# Patient Record
Sex: Female | Born: 1945
Health system: Southern US, Community
[De-identification: ages and names within clinical notes are randomized; demographics above are authoritative.]

## PROBLEM LIST (undated history)

## (undated) DIAGNOSIS — Z8489 Family history of other specified conditions: Secondary | ICD-10-CM

## (undated) DIAGNOSIS — H409 Unspecified glaucoma: Secondary | ICD-10-CM

## (undated) DIAGNOSIS — C541 Malignant neoplasm of endometrium: Secondary | ICD-10-CM

## (undated) DIAGNOSIS — G35 Multiple sclerosis: Secondary | ICD-10-CM

## (undated) DIAGNOSIS — G43909 Migraine, unspecified, not intractable, without status migrainosus: Secondary | ICD-10-CM

## (undated) DIAGNOSIS — I4891 Unspecified atrial fibrillation: Secondary | ICD-10-CM

## (undated) DIAGNOSIS — E069 Thyroiditis, unspecified: Secondary | ICD-10-CM

## (undated) DIAGNOSIS — E038 Other specified hypothyroidism: Secondary | ICD-10-CM

## (undated) HISTORY — DX: Migraine, unspecified, not intractable, without status migrainosus: G43.909

## (undated) HISTORY — DX: Unspecified atrial fibrillation: I48.91

## (undated) HISTORY — DX: Other specified hypothyroidism: E03.8

## (undated) HISTORY — DX: Unspecified glaucoma: H40.9

## (undated) HISTORY — PX: TONSILLECTOMY: SUR1361

## (undated) HISTORY — PX: OTHER SURGICAL HISTORY: SHX169

## (undated) HISTORY — DX: Malignant neoplasm of endometrium: C54.1

## (undated) HISTORY — DX: Multiple sclerosis: G35

## (undated) HISTORY — PX: VAGINAL HYSTERECTOMY: SUR661

## (undated) HISTORY — DX: Other specified hypothyroidism: E06.9

---

## 1992-08-12 DIAGNOSIS — Z8679 Personal history of other diseases of the circulatory system: Secondary | ICD-10-CM

## 1992-08-12 HISTORY — DX: Personal history of other diseases of the circulatory system: Z86.79

## 1999-05-14 ENCOUNTER — Other Ambulatory Visit: Admission: RE | Admit: 1999-05-14 | Discharge: 1999-05-14 | Payer: Self-pay | Admitting: Internal Medicine

## 1999-06-27 ENCOUNTER — Encounter: Payer: Self-pay | Admitting: Internal Medicine

## 1999-06-27 ENCOUNTER — Encounter: Admission: RE | Admit: 1999-06-27 | Discharge: 1999-06-27 | Payer: Self-pay | Admitting: Internal Medicine

## 2000-02-14 ENCOUNTER — Encounter (INDEPENDENT_AMBULATORY_CARE_PROVIDER_SITE_OTHER): Payer: Self-pay

## 2000-02-14 ENCOUNTER — Other Ambulatory Visit: Admission: RE | Admit: 2000-02-14 | Discharge: 2000-02-14 | Payer: Self-pay | Admitting: Obstetrics and Gynecology

## 2000-06-25 ENCOUNTER — Encounter (INDEPENDENT_AMBULATORY_CARE_PROVIDER_SITE_OTHER): Payer: Self-pay

## 2000-06-25 ENCOUNTER — Other Ambulatory Visit: Admission: RE | Admit: 2000-06-25 | Discharge: 2000-06-25 | Payer: Self-pay | Admitting: Obstetrics and Gynecology

## 2001-04-22 ENCOUNTER — Encounter: Payer: Self-pay | Admitting: Internal Medicine

## 2001-04-22 ENCOUNTER — Encounter: Admission: RE | Admit: 2001-04-22 | Discharge: 2001-04-22 | Payer: Self-pay | Admitting: Internal Medicine

## 2001-06-18 ENCOUNTER — Other Ambulatory Visit: Admission: RE | Admit: 2001-06-18 | Discharge: 2001-06-18 | Payer: Self-pay | Admitting: Obstetrics and Gynecology

## 2002-09-03 ENCOUNTER — Encounter: Payer: Self-pay | Admitting: Internal Medicine

## 2002-09-03 ENCOUNTER — Encounter: Admission: RE | Admit: 2002-09-03 | Discharge: 2002-09-03 | Payer: Self-pay | Admitting: Internal Medicine

## 2003-09-15 ENCOUNTER — Other Ambulatory Visit: Admission: RE | Admit: 2003-09-15 | Discharge: 2003-09-15 | Payer: Self-pay | Admitting: Internal Medicine

## 2003-09-20 ENCOUNTER — Encounter: Admission: RE | Admit: 2003-09-20 | Discharge: 2003-09-20 | Payer: Self-pay | Admitting: Internal Medicine

## 2004-01-08 ENCOUNTER — Emergency Department (HOSPITAL_COMMUNITY): Admission: EM | Admit: 2004-01-08 | Discharge: 2004-01-08 | Payer: Self-pay | Admitting: Emergency Medicine

## 2004-11-06 ENCOUNTER — Encounter: Admission: RE | Admit: 2004-11-06 | Discharge: 2004-11-06 | Payer: Self-pay | Admitting: Internal Medicine

## 2005-01-18 ENCOUNTER — Other Ambulatory Visit: Admission: RE | Admit: 2005-01-18 | Discharge: 2005-01-18 | Payer: Self-pay | Admitting: Internal Medicine

## 2005-12-12 ENCOUNTER — Encounter: Admission: RE | Admit: 2005-12-12 | Discharge: 2005-12-12 | Payer: Self-pay | Admitting: Internal Medicine

## 2006-03-20 ENCOUNTER — Other Ambulatory Visit: Admission: RE | Admit: 2006-03-20 | Discharge: 2006-03-20 | Payer: Self-pay | Admitting: Internal Medicine

## 2006-08-12 DIAGNOSIS — G35 Multiple sclerosis: Secondary | ICD-10-CM

## 2006-08-12 HISTORY — DX: Multiple sclerosis: G35

## 2006-12-15 ENCOUNTER — Encounter: Admission: RE | Admit: 2006-12-15 | Discharge: 2006-12-15 | Payer: Self-pay | Admitting: Internal Medicine

## 2007-05-24 ENCOUNTER — Inpatient Hospital Stay (HOSPITAL_COMMUNITY): Admission: EM | Admit: 2007-05-24 | Discharge: 2007-05-28 | Payer: Self-pay | Admitting: Emergency Medicine

## 2007-05-25 ENCOUNTER — Encounter (INDEPENDENT_AMBULATORY_CARE_PROVIDER_SITE_OTHER): Payer: Self-pay | Admitting: Internal Medicine

## 2007-06-08 ENCOUNTER — Encounter: Admission: RE | Admit: 2007-06-08 | Discharge: 2007-07-13 | Payer: Self-pay | Admitting: Internal Medicine

## 2007-08-13 DIAGNOSIS — C541 Malignant neoplasm of endometrium: Secondary | ICD-10-CM

## 2007-08-13 HISTORY — DX: Malignant neoplasm of endometrium: C54.1

## 2007-12-16 ENCOUNTER — Ambulatory Visit (HOSPITAL_COMMUNITY): Admission: RE | Admit: 2007-12-16 | Discharge: 2007-12-16 | Payer: Self-pay | Admitting: Obstetrics and Gynecology

## 2007-12-16 ENCOUNTER — Encounter (INDEPENDENT_AMBULATORY_CARE_PROVIDER_SITE_OTHER): Payer: Self-pay | Admitting: Obstetrics and Gynecology

## 2008-01-05 ENCOUNTER — Encounter: Admission: RE | Admit: 2008-01-05 | Discharge: 2008-01-05 | Payer: Self-pay | Admitting: Internal Medicine

## 2009-01-23 ENCOUNTER — Encounter: Admission: RE | Admit: 2009-01-23 | Discharge: 2009-01-23 | Payer: Self-pay | Admitting: Internal Medicine

## 2009-12-05 ENCOUNTER — Ambulatory Visit (HOSPITAL_COMMUNITY): Admission: RE | Admit: 2009-12-05 | Discharge: 2009-12-05 | Payer: Self-pay | Admitting: Obstetrics and Gynecology

## 2010-03-09 ENCOUNTER — Encounter: Admission: RE | Admit: 2010-03-09 | Discharge: 2010-03-09 | Payer: Self-pay | Admitting: Internal Medicine

## 2010-03-14 ENCOUNTER — Encounter: Admission: RE | Admit: 2010-03-14 | Discharge: 2010-03-14 | Payer: Self-pay | Admitting: Internal Medicine

## 2010-08-12 DIAGNOSIS — K5790 Diverticulosis of intestine, part unspecified, without perforation or abscess without bleeding: Secondary | ICD-10-CM

## 2010-08-12 HISTORY — DX: Diverticulosis of intestine, part unspecified, without perforation or abscess without bleeding: K57.90

## 2010-09-02 ENCOUNTER — Encounter: Payer: Self-pay | Admitting: Internal Medicine

## 2010-10-30 LAB — CBC
HCT: 36.3 % (ref 36.0–46.0)
Hemoglobin: 12.8 g/dL (ref 12.0–15.0)
MCV: 100.9 fL — ABNORMAL HIGH (ref 78.0–100.0)
RBC: 3.6 MIL/uL — ABNORMAL LOW (ref 3.87–5.11)
WBC: 4.6 10*3/uL (ref 4.0–10.5)

## 2010-12-25 NOTE — Op Note (Signed)
NAMEROE, KOFFMAN NO.:  [id number]   MEDICAL RECORD NO.:  [id number]          PATIENT TYPE:  AMB   LOCATION:  SDC                           FACILITY:  [facility]   PHYSICIAN:  Gerald Leitz, MD          DATE OF BIRTH:  12/16/1945   DATE OF PROCEDURE:  12/16/2007  DATE OF DISCHARGE:                               OPERATIVE REPORT   PREOPERATIVE DIAGNOSES:  1. Postmenopausal bleeding.  2. Suspect endometrial mass.   POSTOPERATIVE DIAGNOSES:  1. Postmenopausal bleeding.  2. Suspect endometrial mass.   PROCEDURE:  Hysteroscopy, dilation and curettage.   SURGEON:  Gerald Leitz, MD   ASSISTANT:  None.   ANESTHESIA:  General.   FINDINGS:  Small endometrial mass.   SPECIMEN:  Endometrial curettings and possible polyp.   DISPOSITION OF SPECIMEN:  Pathology.   ESTIMATED BLOOD LOSS:  Minimal.   SORBITOL DEFICIT:  50 mL.   COMPLICATIONS:  None.   INDICATIONS:  This is a 65 year old with postmenopausal bleeding who had  a histogram performed that showed a question of hyperechoic mass  measuring 2.2 x 1.6 cm, desired therapy with hysteroscopy, dilation and  curettage, and possible removal of endometrial mass.  Informed consent  was obtained.   PROCEDURE:  The patient was taken to the operating room where she was  placed under general anesthesia.  She was placed in dorsal lithotomy  position, and prepped and draped in the usual sterile fashion.  Bivalve  speculum was placed into the vaginal wall.  The anterior lip of the  cervix was grasped with a single-tooth tenaculum.  The cervix was then  sounded to 5.5 cm.  Paracervical block was obtained with 5 mL of 0.25%  Marcaine injected at the 5 and 7 o'clock position.  The cervix was then  dilated to approximately 8 mm, and the hysteroscope was inserted.  The  patient was noted to have smooth circumscribed mass in the endometrium.  Hysteroscope was removed.  Sharp curettage was performed until a gritty  texture was noted  all way around.  During the curettage, the mass was  thought to have been removed.  Polyp forceps were then inserted and the  portion of the mass was removed at that time.  The hysteroscope was then  reinserted.  The mass was no longer visible at this time.  The patient's  endometrium appeared irregular.  There was no evidence of uterine  perforation.  The hysteroscope was removed.  The single-tooth tenaculum  was removed from the anterior lip of cervix.  Excellent hemostasis was  noted.  Bivalve speculum was removed.  The patient was taken to the  recovery room, awake and in stable condition.      Gerald Leitz, MD  Electronically Signed     TC/MEDQ  D:  12/16/2007  T:  12/17/2007  Job:  [id number]

## 2010-12-25 NOTE — H&P (Signed)
[name], [name]            ACCOUNT NO.:  [id number]   MEDICAL RECORD NO.:  [id number]         PATIENT TYPE:  LINP   LOCATION:                               FACILITY[facility]  [facility]   PHYSICIAN:  Michelene Gardener, MD    DATE OF BIRTH:  [birth year]   DATE OF ADMISSION:  05/24/2007  DATE OF DISCHARGE:                              HISTORY & PHYSICAL   PRIMARY CARE PHYSICIAN:  Cecille Aver, M.D.   CHIEF COMPLAINT:  Increasing numbness and weakness.   HISTORY OF PRESENT ILLNESS:  This is a 65 year old female with past  medical history of hypothyroidism and osteoporosis who presented with te  above-mentioned complaints.  Her symptoms started 1 week ago on Saturday  when she developed numbness and tingling over two fingers of her left  upper extremity.  Then with the tingling,  she developed numbness in the  same side.  She went on Tuesday to see her primary care physician, and  she met Dr. Donette Larry.  She as diagnosed with possible carpal tunnel  syndrome.  At that time, she still had good hand grip, and she had some  motion of her upper and lower extremities.  Then the numbness started  increasing in her left upper extremity and extended to the left side of  face and then involved her left lower extremity.  She also started  developing weakness over the left side and is dragging her left lower  extremity and is not able to function much with her left upper  extremity.  Currently she has numbness involving her left side, left  lower extremity, left upper extremity, and left side of the face and  weakness mainly in her left upper and lower extremities.  She came into  the ER.  She had a CT scan of the head and then MRI of her brain that  showed possible multiple sclerosis.  The Hospitalists service was called  for further evaluation.   PAST MEDICAL HISTORY:  Significant for:  1. Hypothyroidism.  2. Osteoarthritis.   PAST SURGICAL HISTORY:  Denied.   MEDICATIONS:  1. Synthroid 88  mcg p.o. once daily.  2. Actonel 70 mg p.o. once a week.  3. Vitamin D 50,000 units twice a month.  4. Calcium twice daily.   ALLERGIES:  PENICILLIN which caused rash.   SOCIAL HISTORY:  She denies smoking.  She drinks alcohol occasionally.   FAMILY HISTORY:  Her father had history of memory loss, and they never  found the reason.  Mother has history of skin cancer.  Her grandfather  had history of macular degeneration.  Her mother's sister has history of  brain tumor.   REVIEW OF SYSTEMS:  CONSTITUTIONAL:  Fatigability.  EYES:  No blurred  vision.  ENT:  No infection.  No hearing loss.  CARDIOVASCULAR:  No  chest pain, no shortness of breath.  RESPIRATORY:  No cough, no wheezes.  GI:  No nausea, no vomiting, no diarrhea.  GU:  No dysuria, no  hematuria.  ENDOCRINE:  No polyuria, no nocturia.  HEMATOLOGY:  No  bruising, no bleeding.  INFECTIOUS DISEASE:  No rash, no lesions.  NEUROLOGIC:  No numbness or tingling.  The rest of systems reviewed and  negative.   PHYSICAL EXAMINATION:  VITAL SIGNS:  Blood pressure 138/78, respiratory  rate 18, temperature 97.8, respiratory rate 16.  GENERAL APPEARANCE:  This is a middle-age Caucasian female not in acute  distress.  HEENT:  Conjunctivae pink.  Pupils equal, round, and reactive to light  and accommodation.  There is no ptosis. Hearing is intact.  No ear  discharge or infection.  There is no nose infection or bleeding.  Oral  mucosa is dry.  No pharyngeal erythema.  NECK:  Supple.  No JVD, no carotid bruits, no lymphadenopathy, no  thyroid enlargement.  No thyroid tenderness.  No murmurs, gallops, or thrills.  RESPIRATORY:  Breathing between 16 to 18.  There is no use of accessory  muscles.  No costal retractions.  No dullness.  No rales, rhonchi, or  wheeze.  ABDOMEN:  Soft, nontender, nondistended.  No hepatosplenomegaly.  Bowel  sounds are normal.  Umbilicus is central.  EXTREMITIES:  Lower extremities have no edema, no rash, no  varicose  veins.  SKIN:  No rash, no erythema.  NEUROLOGIC:  Cranial nerves II-XII seem to be intact.  Strength is 5/5  in right upper extremity, 5/5 in right lower extremity, 3/5 in left  lower extremity and 3/5 in left upper extremity.  Sensation is decreased  on the left side and normal on the right side.  The patient has good  coordination on the left side.   LABORATORY RESULTS:  Sodium 141, potassium 4.0, chloride 107, bicarb 29,  glucose 100, BUN 10, creatinine 0.71.  INR 1.0.  WBC 5.1, hemoglobin  13.4, hematocrit 38.5, MCV 92.5, platelet count 246.   MRI showed lesions questionable for multiple sclerosis.   IMPRESSION AND ASSESSMENT:  1. Multiple sclerosis.  I will admit this patient to the floor with      telemetry monitoring.  I will start her on a high dose of steroids.      Will start Solu-Medrol 1 gram IV once a day to be given as needed      for 5 days and then use tapering dose of prednisone.  This patient      will need neurologic evaluation, and will request neurologic      evaluation in the morning.  I will start this patient on aspirin      325 mg once a day.  I will get a physical therapy evaluation.  I      will also request an echocardiogram and carotid Dopplers just to      rule out the possibility of any underlying cause for a possible      stroke.  2. Hypothyroidism  Will continue Synthroid.  3. Osteoporosis. Will continue Actonel.   DISPOSITION:  This patient will be followed by Dr. Theressa Millard  starting May 25, 2007.   Total assessment time is 1 hour.      Michelene Gardener, MD  Electronically Signed     NAE/MEDQ  D:  05/24/2007  T:  05/24/2007  Job:  [id number]   cc:   Cecille Aver, M.D.  Fax: [phone]   Theressa Millard, M.D.  Fax: [phone]

## 2010-12-25 NOTE — H&P (Signed)
[name], [name] NO.:  [id number]   MEDICAL RECORD NO.:  [id number]          PATIENT TYPE:  AMB   LOCATION:  SDC                           FACILITY:  [facility]   PHYSICIAN:  Gerald Leitz, MD          DATE OF BIRTH:  12/25/1945   DATE OF ADMISSION:  12/15/2007  DATE OF DISCHARGE:                              HISTORY & PHYSICAL   DATE OF SCHEDULED SURGERY:  Dec 16, 2007.   HISTORY OF PRESENT ILLNESS:  This is a 65 year old G1, P1 with  postmenopausal bleeding secondary to suspected endometrial polyp or  mass.  She had an ultrasound performed on November 18, 2007, that showed the  endometrium to be thickened 1.79 cm with a questionable hyperechoic mass  measuring 2.2 x 1.6 cm.  The ovaries appeared normal bilaterally.  The  uterus appeared normal with a length of 6.0 cm.  She had spotting on a  daily basis since December 2008.   PAST MEDICAL HISTORY:  Multiple sclerosis, mitral valve prolapse,  hypothyroidism, and osteopenia.   PAST SURGICAL HISTORY:  Tonsillectomy in 1954.   OB HISTORY:  Spontaneous vaginal delivery x1.   GYN HISTORY:  No history of sexually transmitted diseases.  Previous  history of abnormal Pap smears; however, these resolved spontaneously.  Last Pap smear was normal.   MEDICATIONS:  Rebif, Synthroid, vitamin B, Caltrate, and Fosamax.   ALLERGIES:  Penicillin.   SOCIAL HISTORY:  The patient is married.  She denies tobacco use.  No  illicit drug use.   FAMILY HISTORY:  Negative for breast or ovarian cancer.   REVIEW OF SYSTEMS:  Negative except as stated in history of current  illness.   PHYSICAL EXAMINATION:  VITAL SIGNS:  Blood pressure 118/74, weight 116  pounds.  CARDIOVASCULAR:  Regular rate and rhythm.  LUNGS:  Clear to auscultation bilaterally.  ABDOMEN:  Soft, nontender, and nondistended.  No masses.  Positive bowel  sounds.  EXTREMITIES:  No clubbing, cyanosis, or edema.  GU:  Pelvic exam, atrophic external female genitalia.  No  vulvar or  vaginal lesions are seen.  Bimanual exam reveals normal size uterus.  No  adnexal masses, no tenderness.   ASSESSMENT AND PLAN:  A 65 year old with postmenopausal bleeding,  suspected endometrial polyp or mass, desires therapy with hysteroscopy,  dilation and curettage, and removal of endometrial mass.  Risks,  benefits, and  alternatives of surgery were discussed with the patient including but  not limited to infection, bleeding, possible perforation of the uterus,  damage to the surrounding organs, and need for further surgery.  The  patient was educated of all risks and desires to proceed with  hysteroscopy, dilation and curettage, and removal of endometrial mass.      Gerald Leitz, MD  Electronically Signed     TC/MEDQ  D:  12/15/2007  T:  12/16/2007  Job:  [id number]

## 2010-12-25 NOTE — Consult Note (Signed)
NAMECARMALITA, [name] NO.:  [id number]   MEDICAL RECORD NO.:  [id number]          PATIENT TYPE:  INP   LOCATION:  1418                         FACILITY:  [facility]   PHYSICIAN:  Melvyn Novas, M.D.  DATE OF BIRTH:  08/16/1945   DATE OF CONSULTATION:  05/25/2007  DATE OF DISCHARGE:                                 CONSULTATION   PRIMARY CARE PHYSICIAN:  Theressa Millard, M.D. at [facility].   HISTORY OF PRESENT ILLNESS:  Ms. Jiggetts is a 65 year old Caucasian  right-handed female who presented yesterday to the [facility] emergency  room after noticing new progression in the left-sided weakness, the  feeling of heaviness, and the inability to use her left hand.  She had  actually symptoms of the same kind since Saturday the preceding week,  but at that time saw Dr. Earl Gala in the outpatient setting, and was only  affected in the sensory of her left hand.  There was no associated  weakness, and it was felt that this could most likely be an ulna palsy  or carpal tunnel syndrome, however, over the last weekend, the symptoms  had progressed and now involves also the lower extremity and facial  numbness.  The patient was presenting to the ER with left hemiparesis,  and therefore stroke workup was first ordered.  Once an MRI was  obtained, it showed multiple old and one large new acute demyelinating  periventricular located lesion.  The new large lesion is in the right  hemisphere and fit the clinical symptoms.  The patient presents with the  differential now as multiple sclerosis versus other demyelinating or  vasculitis diseases.  Currently, the patient has still left-sided  weakness and numbness affecting her arm more than her leg.  But there is  no longer any facial weakness, and the patient states that she has been  noticing an increase in strength in her left lower extremity.   REVIEW OF SYSTEMS:  Tingling, weakness, left face, left arm, left  hand  at the time of presentation.  No diplopia, no dysphagia, no vision loss.  Possibly new symptoms are the inability to tie her shoes.  When she  stands on one leg, she has trouble with her balance needed to lean on  the wall.  This has probably occurred last month, and she also had just  in the last week an episode of urge incontinence which is normally not  the case for her.  Of course these review of systems are only obtained  in retrospect.  The patient never had the degree of impairment that she  now presented to the ER with.   SOCIAL HISTORY:  The patient drinks occasional social alcohol.  She does  not smoke, does not use any drugs.  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  Her hobby is gardening.  Her adult children have  established their own household.  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   FAMILY HISTORY:  Positive for rheumatoid arthritis in her mother,  possibly rheumatoid arthritis with osteophytes at the neck for her  father, and her mother also suffers from osteoporosis.  There is no  history of diabetes, hypertension, blood dyscrasias.  No lupus, and no  known history of MS.   PAST MEDICAL HISTORY.:  1. Osteopenia.  2. Corneal abrasion.  3. Anemia.  4. Dysmenorrhagia and perimenopause which has not recurred for the      last 7 years.  5. She has never had high cholesterol, high blood pressures or high      blood sugars.   PHYSICAL EXAMINATION:  VITAL SIGNS:  Regular.  The patient has a heart  rate of 62, respiratory rate of 18.  LUNGS:  Clear to auscultation.  Blood pressure was obtained through the  chart.  I did not measured it myself, and was 120/70 at the time she  presented first to the floor yesterday.  Intermittently, systolic blood  pressures have been as low at 86 and diastolic 50.  EXTREMITIES:  No peripheral clubbing, cyanosis or edema.  NEUROLOGICAL:  The patient sits in bed, is  alert, oriented, pleasant.  No acute distress.  No visible injuries.  The patient holds her left arm  with her right hand and moves it very passively.  Cranial nerve  examination:  The patient has preserved facial symmetry no extraocular  movements.  She states now that she has no papilledema.  She has good  peripheral vision, and no sensation of pressure behind the eyes  normal  neck range of motion is preserved.  Uvula and tongue are in midline.  Facial sensation tested to fine touch shows no difference between left  and right.  Motor examination shows the right side to be fully intact.  The left arm does not show antigravity movement, but can move in the  plane.  Grip strength is minimal.  The left leg can now be lifted for  about 5-7 seconds, antigravity drifts.  She has upgoing toes bilaterally  and bilaterally very brisk reflexes indicating spasticity and possible  preexisting upper motor neuron diseased lesions.  Sensory:  The patient  still has tingling and numbness in arm and hand, and to fine touch and  pinprick has a sensation of a gloved left arm or a lining that seems to  prevent her from feeling the stimulus in the normal fashion.  Finger-  nose test could not be performed on the left due to weakness.  Gait was  not tested, but the patient has been up and transferred to the arm chair  and bathroom with support.   ASSESSMENT:  Suspect demyelinating disease, most likely MS.  We will,  however, check for Lyme disease to rule this out.  We will also obtain a  vasculitis panel.   PLAN:  Start with Solu-Medrol as given now for 3 days 1 gram IV.  I  would give it q.20 h so that the peak dose does not interfere with the  patient's sleep pattern quite as much.  She remains on Protonix which  was already ordered by her [facility] physician.  The patient should  afterwards follow with an oral taper which I wrote in the order sheets.  She can have flu shot once she completes her steroid  doses.  Here in the  hospital, she will need rehab, physical therapy and occupational  therapy.  She is on a regular diet.  I would further recommend that the  patient receive an MRI of the cervical and thoracic spine.  Her recent  bladder incontinence symptoms and the generalized briskness of her  reflexes could relate to spinal cord lesion with MS.  If the diagnosis  of MS is verified by negative Lyme testing.  I would like her to start  on an injectable medication, an interferon, either Rebif or Betaseron  subcutaneously.  I would prefer the self-injector for either of these  drugs as it does not request the patient to either mix medication or to  have good dexterity to place the injectable.  At this time, the patient  can only use her right hand sufficiently, and her fine motor skills are  therefore impaired.  She can start with the injectable medications after  the IV Solu-Medrol course is completed.  I have also talked to Mrs.  Delaluz and her daughter, and her son-in-law who know Dr. Anne Hahn in  our office very well, and recommended a follow-up visit in 3-4 weeks.  If she has difficulties obtaining the injectable medication,  I would be  happy to see her earlier and arrange through our office.      Melvyn Novas, M.D.  Electronically Signed     CD/MEDQ  D:  05/25/2007  T:  05/26/2007  Job:  [id number]   cc:   Theressa Millard, M.D.  Fax: [phone]   [facility]

## 2010-12-25 NOTE — Discharge Summary (Signed)
NAMEMARCENA, [name] NO.:  [id number]   MEDICAL RECORD NO.:  [id number]          PATIENT TYPE:  INP   LOCATION:  1418                         [facility]ACILITY:  [facility]   PHYSICIAN:  Candyce Churn, M.D.DATE OF BIRTH:  26-Mar-1946   DATE OF ADMISSION:  05/24/2007  DATE OF DISCHARGE:  05/28/2007                               DISCHARGE SUMMARY   DISCHARGE DIAGNOSES:  1. Acute onset left-sided weakness and paresthesias.  2. Probable new onset multiple sclerosis secondary to MRI and physical      findings.  3. Hypothyroidism.  4. History of osteoarthritis.  5. History of osteoporosis.   ALLERGIES:  PENICILLIN - RASH.   DISCHARGE MEDICATIONS:  1. Synthroid 88 mcg daily.  2. Vitamin D 50,000 international units twice monthly orally.  3. Actonel 70 mg once weekly.  4. Calcium 500 mg p.o. b.i.d.  5. Rebif 80.8 mcg subcu Monday, Wednesday, Friday for two weeks then      22 mcg subcu Monday, Wednesday, Friday for 2 weeks then as per      neurologist.  6. Prednisone 10 mg daily for 3 days and 20 mg orally for 3 days then      10 mg orally for 3 days then discontinue.   CONSULTATIONS:  Dr. Melvyn Novas, Neurology.   PROCEDURES:  1. Head CT May 24, 2007, revealing chronic ischemic changes,      hypodensity in the right frontal lobe, paraventricular white matter      with suggestion of mass effect upon the right lateral ventricle      frontal horn.  Acute ischemic event was suggested, and MRI was      performed subsequently.  2. MRI of the brain without contrast - fairly significant white matter      disease in the periventricular and subcortical region of the brain      - supratentorial in location with one lesion showing restricted      effusions.  Findings were consistent with acute and chronic      multiple sclerosis.  3. MRI angiography of the head - carotid and basilar arteries patent.      No stenosis or occlusion.  No intracranial aneurysms.  No  evidence      for intracranial atherosclerotic change.  4. Carotid Dopplers - no significant internal carotid artery stenosis,      vertebral artery flow was antegrade.   LABORATORY:  On discharge, B-met from May 28, 2007, revealed sodium  142, potassium was moderately low at 3.1, chloride 107 bicarb 29,  glucose 91, BUN 13, creatinine 0.73.  Cholesterol profile on May 25, 2007, revealed a cholesterol 196, triglycerides 22, cholesterol 80, LDL  cholesterol 112, total cholesterol ratio of 2.5.  TSH on May 24, 2007, was 1.977.  CBC and 212 lady revealed a white count 5100,  hemoglobin 13.4, platelet count 246,000.  Protime was 13.5 seconds.  PTT  he was 32 seconds on May 24, 2007.   HOSPITAL COURSE:  [name] is a very pleasant 65 year old female  with a past medical history of hypothyroidism and osteoporosis.  Over  several  weeks prior to admission, she was noticing some very minimal  problems with balance.  She usually ties her shoes by standing on one  leg and lifting one foot and tying it in the air, and then doing the  same with the other foot.  She realized she could not do that over the  past month.  She would have to lean against the wall to do this.   A week prior to admission, she developed some numbness and tingling of  her two fingers of the left upper extremity and also some paresthesias  on her left side.  The numbness started increasing in the left upper  extremity extending to the left side of her face and involved the left  lower extremity, and she noticed a weakness on the left side.  Principally, left upper extremity greater than the left lower extremity,  but she was dragging her left lower extremity.  She presented to the  emergency room, and MRI revealed probable multiple sclerosis in terms of  her lesions.   She has been hospitalized.  She was treated with high-dose IV Solu-  Medrol 1 gram daily for 4 days and Rebif was started yesterday,  and she  will continue prednisone taper as per above.   She regained strength in her extremities and is adequately prepared to  go to be discharged with outpatient rehab and outpatient nursing visits  to help with her Rebif injections.  She will be followed up with Dr.  Vickey Huger in 2 weeks.   CONDITION ON DISCHARGE:  Improved.      Candyce Churn, M.D.  Electronically Signed     RNG/MEDQ  D:  05/28/2007  T:  05/29/2007  Job:  [id number]   cc:   Theressa Millard, M.D.  Fax: [phone]   Melvyn Novas, M.D.  Fax: [phone]

## 2011-01-31 ENCOUNTER — Other Ambulatory Visit: Payer: Self-pay | Admitting: Internal Medicine

## 2011-01-31 DIAGNOSIS — Z1231 Encounter for screening mammogram for malignant neoplasm of breast: Secondary | ICD-10-CM

## 2011-03-18 ENCOUNTER — Ambulatory Visit
Admission: RE | Admit: 2011-03-18 | Discharge: 2011-03-18 | Disposition: A | Discharge status: Home / Self Care | Payer: 59 | Source: Ambulatory Visit | Attending: Internal Medicine | Admitting: Internal Medicine

## 2011-03-18 DIAGNOSIS — Z1231 Encounter for screening mammogram for malignant neoplasm of breast: Secondary | ICD-10-CM

## 2011-05-22 LAB — BASIC METABOLIC PANEL
BUN: 13
CO2: 26
Calcium: 8.5
Calcium: 8.7
Chloride: 107
Creatinine, Ser: 0.73
Creatinine, Ser: 0.77
GFR calc Af Amer: >60
GFR calc non Af Amer: >60
Glucose, Bld: 144 — ABNORMAL HIGH
Glucose, Bld: 91
Sodium: 139
Sodium: 142

## 2011-05-23 LAB — CBC
HCT: 38.5
Hemoglobin: 13.4
MCHC: 34.8
Platelets: 246
RDW: 13.8

## 2011-05-23 LAB — LIPID PANEL
HDL: 80
Total CHOL/HDL Ratio: 2.5
Triglycerides: 22

## 2011-05-23 LAB — B. BURGDORFI ANTIBODIES BY WB
Lyme Disease 23 kD IgG: NONREACTIVE
Lyme Disease 28 kD IgG: NONREACTIVE
Lyme Disease 39 kD IgG: NONREACTIVE
Lyme Disease 41 kD IgG: NONREACTIVE
Lyme Disease 45 kD IgG: NONREACTIVE
Lyme Disease 66 kD IgG: REACTIVE
Lyme Disease 93 kD IgG: NONREACTIVE

## 2011-05-23 LAB — BASIC METABOLIC PANEL
CO2: 25
CO2: 29
Chloride: 107
Chloride: 111
Creatinine, Ser: 0.71
GFR calc Af Amer: >60
GFR calc non Af Amer: >60
Potassium: 4
Sodium: 142

## 2011-05-23 LAB — DIFFERENTIAL
Eosinophils Absolute: 0.2
Lymphs Abs: 1.7
Monocytes Relative: 10
Neutrophils Relative %: 53

## 2011-05-23 LAB — TSH: TSH: 1.977

## 2012-03-17 ENCOUNTER — Other Ambulatory Visit: Payer: Self-pay | Admitting: Internal Medicine

## 2012-03-17 DIAGNOSIS — Z1231 Encounter for screening mammogram for malignant neoplasm of breast: Secondary | ICD-10-CM

## 2012-04-01 ENCOUNTER — Ambulatory Visit: Payer: 59

## 2012-04-17 ENCOUNTER — Ambulatory Visit
Admission: RE | Admit: 2012-04-17 | Discharge: 2012-04-17 | Disposition: A | Discharge status: Home / Self Care | Payer: 59 | Source: Ambulatory Visit | Attending: Internal Medicine | Admitting: Internal Medicine

## 2012-04-17 DIAGNOSIS — Z1231 Encounter for screening mammogram for malignant neoplasm of breast: Secondary | ICD-10-CM

## 2012-04-24 ENCOUNTER — Ambulatory Visit: Payer: 59 | Attending: Sports Medicine | Admitting: Physical Therapy

## 2012-04-24 DIAGNOSIS — IMO0001 Reserved for inherently not codable concepts without codable children: Secondary | ICD-10-CM | POA: Insufficient documentation

## 2012-04-24 DIAGNOSIS — M25619 Stiffness of unspecified shoulder, not elsewhere classified: Secondary | ICD-10-CM | POA: Insufficient documentation

## 2012-04-24 DIAGNOSIS — M25519 Pain in unspecified shoulder: Secondary | ICD-10-CM | POA: Insufficient documentation

## 2012-04-27 ENCOUNTER — Ambulatory Visit: Payer: 59 | Admitting: Physical Therapy

## 2012-04-29 ENCOUNTER — Ambulatory Visit: Payer: 59 | Admitting: Rehabilitative and Restorative Service Providers"

## 2012-05-05 ENCOUNTER — Ambulatory Visit: Payer: 59 | Admitting: Physical Therapy

## 2012-05-06 ENCOUNTER — Ambulatory Visit: Payer: 59 | Admitting: Rehabilitative and Restorative Service Providers"

## 2012-05-08 ENCOUNTER — Encounter: Payer: 59 | Admitting: Physical Therapy

## 2012-05-11 ENCOUNTER — Ambulatory Visit: Payer: 59 | Admitting: Rehabilitative and Restorative Service Providers"

## 2012-05-13 ENCOUNTER — Encounter: Payer: 59 | Admitting: Physical Therapy

## 2012-05-13 ENCOUNTER — Ambulatory Visit: Payer: 59 | Attending: Sports Medicine | Admitting: Rehabilitative and Restorative Service Providers"

## 2012-05-13 DIAGNOSIS — IMO0001 Reserved for inherently not codable concepts without codable children: Secondary | ICD-10-CM | POA: Insufficient documentation

## 2012-05-13 DIAGNOSIS — M25519 Pain in unspecified shoulder: Secondary | ICD-10-CM | POA: Insufficient documentation

## 2012-05-13 DIAGNOSIS — M25619 Stiffness of unspecified shoulder, not elsewhere classified: Secondary | ICD-10-CM | POA: Insufficient documentation

## 2012-05-18 ENCOUNTER — Ambulatory Visit: Payer: 59 | Admitting: Rehabilitative and Restorative Service Providers"

## 2012-05-20 ENCOUNTER — Ambulatory Visit: Payer: 59 | Admitting: Rehabilitative and Restorative Service Providers"

## 2012-05-25 ENCOUNTER — Encounter: Payer: 59 | Admitting: Rehabilitative and Restorative Service Providers"

## 2012-05-27 ENCOUNTER — Encounter: Payer: 59 | Admitting: Rehabilitative and Restorative Service Providers"

## 2012-05-28 ENCOUNTER — Encounter: Payer: 59 | Admitting: Rehabilitative and Restorative Service Providers"

## 2012-05-29 ENCOUNTER — Encounter: Payer: 59 | Admitting: Physical Therapy

## 2012-11-18 ENCOUNTER — Other Ambulatory Visit: Payer: Self-pay

## 2012-12-15 ENCOUNTER — Ambulatory Visit (INDEPENDENT_AMBULATORY_CARE_PROVIDER_SITE_OTHER): Payer: 59 | Admitting: Nurse Practitioner

## 2012-12-15 ENCOUNTER — Encounter: Payer: Self-pay | Admitting: Nurse Practitioner

## 2012-12-15 VITALS — BP 110/60 | HR 66 | Ht 66.0 in | Wt 112.0 lb

## 2012-12-15 DIAGNOSIS — I4819 Other persistent atrial fibrillation: Secondary | ICD-10-CM | POA: Insufficient documentation

## 2012-12-15 DIAGNOSIS — R519 Headache, unspecified: Secondary | ICD-10-CM | POA: Insufficient documentation

## 2012-12-15 DIAGNOSIS — Z79899 Other long term (current) drug therapy: Secondary | ICD-10-CM

## 2012-12-15 DIAGNOSIS — Z7901 Long term (current) use of anticoagulants: Secondary | ICD-10-CM | POA: Insufficient documentation

## 2012-12-15 DIAGNOSIS — G35D Multiple sclerosis, unspecified: Secondary | ICD-10-CM

## 2012-12-15 DIAGNOSIS — G35 Multiple sclerosis: Secondary | ICD-10-CM

## 2012-12-15 DIAGNOSIS — R51 Headache: Secondary | ICD-10-CM

## 2012-12-15 DIAGNOSIS — R209 Unspecified disturbances of skin sensation: Secondary | ICD-10-CM

## 2012-12-15 NOTE — Patient Instructions (Addendum)
Continue Rebif 3 times a week Given sample of Cambia to try for headaches acutely Followup in 6 months and when necessary

## 2012-12-15 NOTE — Progress Notes (Signed)
HPI: Patient returns for followup after last visit 05/27/2012. She has a history of multiple sclerosis which was diagnosed in 2008. She is currently on Rebif tolerating the medication without significant injection reactions. She does not pretreat. She denies  spasms, focal weakness, sensory changes, visual changes, speech or swallowing problems, no problems with bowel or bladder function. She has not fallen, she does not use an assistive device. She does report several bad headaches in the last 6 months that lasted for several days not relieved with over-the-counter preparations. She has a history of migraines.   ROS:  - headache  Physical Exam General: well developed, well nourished, seated, in no evident distress Head: head normocephalic and atraumatic. Oropharynx benign Neck: supple with no carotid or supraclavicular bruits Cardiovascular: regular rate and rhythm, no murmurs  Neurologic Exam Mental Status: Awake and fully alert. Oriented to place and time. Follows all commands.  Mood and affect appropriate.  Cranial Nerves: Pupils equal, briskly reactive to light. Extraocular movements full without nystagmus. Visual fields full to confrontation. Hearing intact and symmetric to finger snap. Facial sensation intact. Face, tongue, palate move normally and symmetrically. Neck flexion and extension normal.  Motor: Normal bulk and tone. Normal strength in all tested extremity muscles. Sensory.: intact to touch and pinprick and vibratory.  Coordination: Rapid alternating movements normal in all extremities. Finger-to-nose and heel-to-shin performed accurately bilaterally. Gait and Station: Arises from chair without difficulty. Stance is normal. Gait demonstrates normal stride length and balance . Able to heel, toe and tandem walk without difficulty.  Reflexes: 2+ and symmetric. Toes downgoing.     ASSESSMENT: Multiple sclerosis currently on Rebif without side effects. Headaches with nausea and  visual disturbance, history of migraine     PLAN: Continue Rebif 3 times a week, labs done at primary care Given sample of [place] to try for headaches acutely, call for Rx Followup in 6 months and when necessary   [name], GNP-BC APRN

## 2013-04-15 ENCOUNTER — Other Ambulatory Visit: Payer: Self-pay

## 2013-04-15 DIAGNOSIS — Z1231 Encounter for screening mammogram for malignant neoplasm of breast: Secondary | ICD-10-CM

## 2013-04-30 ENCOUNTER — Ambulatory Visit
Admission: RE | Admit: 2013-04-30 | Discharge: 2013-04-30 | Disposition: A | Discharge status: Home / Self Care | Payer: 59 | Source: Ambulatory Visit

## 2013-04-30 DIAGNOSIS — Z1231 Encounter for screening mammogram for malignant neoplasm of breast: Secondary | ICD-10-CM

## 2013-05-06 ENCOUNTER — Other Ambulatory Visit: Payer: Self-pay | Admitting: Neurology

## 2013-05-11 ENCOUNTER — Other Ambulatory Visit: Payer: Self-pay

## 2013-05-11 MED ORDER — INTERFERON BETA-1A 44 MCG/0.5ML ~~LOC~~ SOLN: 44.0000 ug | SUBCUTANEOUS | Status: DC

## 2013-05-11 NOTE — Telephone Encounter (Signed)
Pharmacy called and asked [place] to resend Rx.

## 2013-06-17 ENCOUNTER — Ambulatory Visit (INDEPENDENT_AMBULATORY_CARE_PROVIDER_SITE_OTHER): Payer: Medicare Other | Admitting: Nurse Practitioner

## 2013-06-17 ENCOUNTER — Encounter: Payer: Self-pay | Admitting: Nurse Practitioner

## 2013-06-17 ENCOUNTER — Encounter (INDEPENDENT_AMBULATORY_CARE_PROVIDER_SITE_OTHER): Payer: Self-pay

## 2013-06-17 VITALS — BP 92/55 | HR 71 | Ht 67.0 in | Wt 112.0 lb

## 2013-06-17 DIAGNOSIS — R209 Unspecified disturbances of skin sensation: Secondary | ICD-10-CM

## 2013-06-17 DIAGNOSIS — R51 Headache: Secondary | ICD-10-CM

## 2013-06-17 DIAGNOSIS — Z79899 Other long term (current) drug therapy: Secondary | ICD-10-CM

## 2013-06-17 DIAGNOSIS — G35 Multiple sclerosis: Secondary | ICD-10-CM

## 2013-06-17 MED ORDER — INTERFERON BETA-1A 44 MCG/0.5ML ~~LOC~~ SOLN: 44.0000 ug | SUBCUTANEOUS | Status: DC

## 2013-06-17 NOTE — Progress Notes (Addendum)
[facility]  PATIENT: [name] DOB: Apr 25, 1946   REASON FOR VISIT: Followup for multiple sclerosis    HISTORY OF PRESENT ILLNESS:[name], 67 year old white female returns for followup. She is a patient of Dr. Vickey Huger who is out of the office today She has a history of multiple sclerosis which was diagnosed in 2008. She is currently on Rebif tolerating the medication without significant injection reactions. She does not pretreat. She denies spasms, focal weakness, sensory changes, visual changes, speech or swallowing problems, no problems with bowel or bladder function. She has not fallen, she does not use an assistive device. Headaches in good control.  She has a history of migraines. Last MRI of the brain 2009.    REVIEW OF SYSTEMS: Full 14 system review of systems performed and notable only for:  Constitutional: N/A  Cardiovascular: N/A  Ear/Nose/Throat: N/A  Skin: N/A  Eyes: N/A  Respiratory: N/A  Gastroitestinal: N/A  Hematology/Lymphatic: N/A  Endocrine: N/A Musculoskeletal:N/A  Allergy/Immunology: N/A  Neurological: N/A Psychiatric: N/A   ALLERGIES: Allergies  Allergen Reactions  . Codeine   . Penicillins     HOME MEDICATIONS: Outpatient Prescriptions Prior to Visit  Medication Sig Dispense Refill  . Fexofenadine HCl (ALLEGRA PO) Take by mouth.      . interferon beta-1a (REBIF) 44 MCG/0.5ML injection Inject 0.5 mLs (44 mcg total) into the skin 3 (three) times a week.  6 mL  2  . Iron TABS Take by mouth.      . levothyroxine (SYNTHROID, LEVOTHROID) 88 MCG tablet       . Vitamin D, Ergocalciferol, (DRISDOL) 50000 UNITS CAPS        No facility-administered medications prior to visit.    PAST MEDICAL HISTORY: Past Medical History  Diagnosis Date  . Migraine   . Endometrial cancer   . Multiple sclerosis     PAST SURGICAL HISTORY: Past Surgical History  Procedure Laterality Date  . Vaginal hysterectomy      FAMILY  HISTORY: History reviewed. No pertinent family history.  SOCIAL HISTORY: History   Social History  . Marital Status: Married    Spouse Name: August Saucer    Number of Children: 1  . Years of Education: 14   Occupational History  .  [employer]   Social History Main Topics  . Smoking status: Never Smoker   . Smokeless tobacco: Never Used  . Alcohol Use: Yes     Comment: wine on occ  . Drug Use: No  . Sexual Activity: Not on file   Other Topics Concern  . Not on file   Social History Narrative   Patient is married Development worker, international aid) and lives with his husband.   Patient has one child.   Patient is retired.   Patient has a college education.   Patient is right-handed.   Patient drinks very little caffeine.     PHYSICAL EXAM  Filed Vitals:   06/17/13 0954  BP: 92/55  Pulse: 71  Height: 5\' 7"  (1.702 m)  Weight: 112 lb (50.803 kg)   Body mass index is 17.54 kg/(m^2).  Generalized: Well developed, in no acute distress  Head: normocephalic and atraumatic,. Oropharynx benign  Neck: Supple, no carotid bruits  Cardiac: Regular rate rhythm, no murmur  Musculoskeletal: No deformity   Neurological examination   Mentation: Alert oriented to time, place, history taking. Follows all commands speech and language fluent  Cranial nerve II-XII: Pupils were equal round reactive to light extraocular movements were full, visual field were  full on confrontational test. Facial sensation and strength were normal. hearing was intact to finger rubbing bilaterally. Uvula tongue midline. head turning and shoulder shrug and were normal and symmetric.Tongue protrusion into cheek strength was normal. Motor: normal bulk and tone, full strength in the BUE, BLE, fine finger movements normal, no pronator drift. No focal weakness Sensory: normal and symmetric to light touch, pinprick, and  vibration  Coordination: finger-nose-finger, heel-to-shin bilaterally, no dysmetria Reflexes: Brachioradialis 2/2, biceps 2/2,  triceps 2/2, patellar 2/2, Achilles 2/2, plantar responses were flexor bilaterally. Gait and Station: Rising up from seated position without assistance, normal stance, oderate stride, good arm swing, smooth turning, able to perform tiptoe, and heel walking without difficulty.   DIAGNOSTIC DATA (LABS, IMAGING, TESTING) -None to review    ASSESSMENT AND PLAN  67 y.o. year old female  has a past medical history of Migraine; Endometrial cancer; and Multiple sclerosis. here to followup. She is currently on Rebif without exacerbation. She has questions about atrophy of the brain with Mandy. Aware that her last MRI was in 2009 needs to be repeated. She also has concerns about being able to pay for her Rebif next year when she will lose her retirement medical insurance.  Will check labs today, CBC, CMP  Will get MRI of the brain and compare to 2009 Continue Rebif 3 times a week will refill Followup in 6 months. Vst time 25 min Nilda Riggs, [facility], [facility], APRN  [facility] [address], [address] [place], [place] [address] [phone]  I reviewed the above note and documentation by the Nurse Practitioner and agree with the history, physical exam, assessment and plan as outlined above.

## 2013-06-17 NOTE — Patient Instructions (Signed)
Will check labs today Will get MRI of the brain and compare to 2009 Continue Rebif 3 times a week Followup in 6 months

## 2013-06-23 ENCOUNTER — Telehealth: Payer: Self-pay | Admitting: Nurse Practitioner

## 2013-06-23 NOTE — Telephone Encounter (Signed)
I do not see that patient had labs ordered to monitor the side effects with Rebif. Please call and remind her to  get labs done.

## 2013-06-24 NOTE — Telephone Encounter (Signed)
noted 

## 2013-06-24 NOTE — Telephone Encounter (Signed)
Called patient to inform of labs and she said that she going to Pcp next week and will have them done there, will him fax labs to our office

## 2013-06-30 ENCOUNTER — Other Ambulatory Visit: Payer: Self-pay | Admitting: Neurology

## 2013-06-30 ENCOUNTER — Telehealth: Payer: Self-pay | Admitting: Neurology

## 2013-07-01 NOTE — Telephone Encounter (Signed)
Rx was already resent for a 3 month supply.

## 2013-07-02 ENCOUNTER — Ambulatory Visit
Admission: RE | Admit: 2013-07-02 | Discharge: 2013-07-02 | Disposition: A | Discharge status: Home / Self Care | Payer: Medicare Other | Source: Ambulatory Visit | Attending: Nurse Practitioner | Admitting: Nurse Practitioner

## 2013-07-02 DIAGNOSIS — G35 Multiple sclerosis: Secondary | ICD-10-CM

## 2013-07-02 MED ORDER — GADOBENATE DIMEGLUMINE 529 MG/ML IV SOLN
10.0000 mL | Freq: Once | INTRAVENOUS | Status: AC | PRN
  Administered 2013-07-02: 10 mL via INTRAVENOUS

## 2013-08-18 ENCOUNTER — Other Ambulatory Visit: Payer: Self-pay

## 2013-08-18 MED ORDER — INTERFERON BETA-1A 44 MCG/0.5ML ~~LOC~~ SOLN: 44.0000 ug | SUBCUTANEOUS | Status: DC

## 2013-12-15 ENCOUNTER — Ambulatory Visit (INDEPENDENT_AMBULATORY_CARE_PROVIDER_SITE_OTHER): Payer: Medicare Other | Admitting: Nurse Practitioner

## 2013-12-15 ENCOUNTER — Encounter: Payer: Self-pay | Admitting: Nurse Practitioner

## 2013-12-15 ENCOUNTER — Encounter: Payer: Self-pay | Admitting: Neurology

## 2013-12-15 VITALS — BP 101/60 | HR 64 | Ht 66.0 in | Wt 112.0 lb

## 2013-12-15 DIAGNOSIS — R209 Unspecified disturbances of skin sensation: Secondary | ICD-10-CM

## 2013-12-15 DIAGNOSIS — G35D Multiple sclerosis, unspecified: Secondary | ICD-10-CM

## 2013-12-15 DIAGNOSIS — G35 Multiple sclerosis: Secondary | ICD-10-CM

## 2013-12-15 DIAGNOSIS — Z79899 Other long term (current) drug therapy: Secondary | ICD-10-CM

## 2013-12-15 NOTE — Patient Instructions (Signed)
Continue Rebif 3 times weekly Review of labs from Dr. Nancee Liter office Discuss most recent MRI of the brain 06/23/13 Followup in 6 months

## 2013-12-15 NOTE — Progress Notes (Signed)
[facility]  PATIENT: [name] DOB: [birth year]   REASON FOR VISIT: Followup for multiple sclerosis    HISTORY OF PRESENT ILLNESS:[name], 68 year old female returns for followup. She has a history of multiple sclerosis which was diagnosed in 2008. She is currently on Rebif tolerating the medication without significant injection reactions. She does not pretreat. She denies spasms, focal weakness, sensory changes, visual changes, speech or swallowing problems, no problems with bowel or bladder function. She has not fallen, she does not use an assistive device. Headaches in good control. She has a history of migraines. Last MRI of the brain 07/02/13 read as abnormal MRI scan of brain showing multilple periventricular and supratentorial white matter lesions consistent with multiple sclerosis. No enhancing lesions are noted. The presence of T 1 black holes indicates chronic disease.Overall mild increase in white matter lesion load compared to MRI 05/24/2007      REVIEW OF SYSTEMS: Full 14 system review of systems performed and notable only for those listed, all others are neg:  Constitutional: N/A  Cardiovascular: N/A  Ear/Nose/Throat: N/A  Skin: N/A  Eyes: N/A  Respiratory: N/A  Gastroitestinal: N/A  Hematology/Lymphatic: N/A  Endocrine: N/A Musculoskeletal:N/A  Allergy/Immunology: N/A  Neurological: occasional dizziness Psychiatric: N/A Sleep : NA   ALLERGIES: Allergies  Allergen Reactions  . Codeine   . Penicillins     HOME MEDICATIONS: Outpatient Prescriptions Prior to Visit  Medication Sig Dispense Refill  . Fexofenadine HCl (ALLEGRA PO) Take by mouth.      . interferon beta-1a (REBIF) 44 MCG/0.5ML injection Inject 0.5 mLs (44 mcg total) into the skin 3 (three) times a week.  18 mL  1  . Iron TABS Take by mouth.      . levothyroxine (SYNTHROID, LEVOTHROID) 88 MCG tablet       . Vitamin D, Ergocalciferol, (DRISDOL) 50000 UNITS CAPS         No facility-administered medications prior to visit.    PAST MEDICAL HISTORY: Past Medical History  Diagnosis Date  . Migraine   . Endometrial cancer   . Multiple sclerosis     PAST SURGICAL HISTORY: Past Surgical History  Procedure Laterality Date  . Vaginal hysterectomy      FAMILY HISTORY: History reviewed. No pertinent family history.  SOCIAL HISTORY: History   Social History  . Marital Status: Married    Spouse Name: [name]    Number of Children: 1  . Years of Education: 14   Occupational History  .  [employer]   Social History Main Topics  . Smoking status: Never Smoker   . Smokeless tobacco: Never Used  . Alcohol Use: Yes     Comment: wine on occ  . Drug Use: No  . Sexual Activity: Not on file   Other Topics Concern  . 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   Patient has one child.   Patient is retired.   Patient has a college education.   Patient is right-handed.   Patient drinks very little caffeine.     PHYSICAL EXAM  Filed Vitals:   12/15/13 1451  BP: 101/60  Pulse: 64  Height: 5\' 6"  (1.676 m)  Weight: 112 lb (50.803 kg)   Body mass index is 18.09 kg/(m^2).  Generalized: Well developed, in no acute distress  Head: normocephalic and atraumatic,. Oropharynx benign  Neck: Supple, no carotid bruits  Cardiac: Regular rate rhythm, no murmur  Musculoskeletal: No deformity   Neurological examination   Mentation: Alert oriented to time, place, history taking. Follows all commands speech and language fluent  Cranial nerve II-XII: Fundoscopic exam reveals sharp disc margins.Pupils were equal round reactive to light extraocular movements were full, visual field were full on confrontational test. Facial sensation and strength were normal. hearing was intact to finger rubbing bilaterally. Uvula tongue midline. head turning and shoulder shrug were normal and symmetric.Tongue protrusion  into cheek strength was normal. Motor: normal bulk and tone, full strength in the BUE, BLE, fine finger movements normal, no pronator drift. No focal weakness Sensory: normal and symmetric to light touch, pinprick, and  vibration  Coordination: finger-nose-finger, heel-to-shin bilaterally, no dysmetria Reflexes: Brachioradialis 2/2, biceps 2/2, triceps 2/2, patellar 2/2, Achilles 2/2, plantar responses were flexor bilaterally. Gait and Station: Rising up from seated position without assistance, normal stance,  moderate stride, good arm swing, smooth turning, able to perform tiptoe, and heel walking without difficulty. Tandem gait is steady  DIAGNOSTIC DATA (LABS, IMAGING, TESTING) -   ASSESSMENT AND PLAN  68 y.o. year old female  has a past medical history of Migraine;  and Multiple sclerosis. here to followup. She remains on Rebif 3 times a week without injection site issues.   Continue Rebif 3 times weekly Reviewed   labs from Dr. Nancee Liter office done within the last 6 months Discussed most recent MRI of the brain from 06/23/13 which was abnormal MRI scan of brain showing multilple periventricular and supratentorial white matter lesions consistent with multiple sclerosis. No enhancing lesions are noted. The presence of T 1 black holes indicates chronic disease.Overall mild increase in white matter lesion load compared to MRI 05/24/2007  Followup in 6 months [name], [facility], [facility], [facility]  [facility] [address], [place], [place] [address] [phone]

## 2013-12-16 NOTE — Progress Notes (Signed)
I agree with the assessment and plan as directed by NP .The patient is known to me .   Laporsche Hoeger, MD  

## 2013-12-22 ENCOUNTER — Telehealth: Payer: Self-pay | Admitting: Nurse Practitioner

## 2013-12-22 NOTE — Telephone Encounter (Signed)
Patient calling stating she had an appointment last week and forgot to tell Hoyle Sauer about her headaches. She gets terrible headaches at least once a month, sometimes more. She had one on Sunday that required her taking 10 Excedrin tablets. She would like to try Fioricet (her daughter takes this and it works well for her), or any other medication that Hoyle Sauer thinks would be appropriate. Please call to advise.

## 2013-12-22 NOTE — Telephone Encounter (Signed)
Cm, you seen patient for MS last week. Not sure if headaches would be considered a new problem. Please advise.

## 2013-12-22 NOTE — Telephone Encounter (Signed)
Patient scheduled for 12/27/13.

## 2013-12-22 NOTE — Telephone Encounter (Signed)
Please set up appt for headaches, very common with MS patients and she has history of migraine

## 2013-12-27 ENCOUNTER — Encounter: Payer: Self-pay | Admitting: Nurse Practitioner

## 2013-12-27 ENCOUNTER — Ambulatory Visit (INDEPENDENT_AMBULATORY_CARE_PROVIDER_SITE_OTHER): Payer: Medicare Other | Admitting: Nurse Practitioner

## 2013-12-27 VITALS — BP 106/67 | HR 70 | Ht 66.0 in | Wt 111.0 lb

## 2013-12-27 DIAGNOSIS — G35 Multiple sclerosis: Secondary | ICD-10-CM

## 2013-12-27 DIAGNOSIS — R51 Headache: Secondary | ICD-10-CM

## 2013-12-27 MED ORDER — RIZATRIPTAN BENZOATE 10 MG PO TBDP: 10.0000 mg | ORAL_TABLET | ORAL | Status: DC | PRN

## 2013-12-27 MED ORDER — ONDANSETRON HCL 4 MG PO TABS: 4.0000 mg | ORAL_TABLET | Freq: Three times a day (TID) | ORAL | Status: DC | PRN

## 2013-12-27 NOTE — Progress Notes (Signed)
I agree with the assessment and plan as directed by NP .The patient is known to me .   Tnia Anglada, MD  

## 2013-12-27 NOTE — Patient Instructions (Signed)
Try Maxalt 10 mg melt for migraine Zofran 4 mg when necessary nausea Continue Rebif as ordered Followup in 6 months,

## 2013-12-27 NOTE — Progress Notes (Signed)
[facility]  PATIENT: [name] DOB: Oct 22, 1945   REASON FOR VISIT: Followup for headaches    HISTORY OF PRESENT ILLNESS: [name], 68 year old female returns for followup. She was seen a couple of weeks ago and now has a few  migraines which apparently she has had in the past  however she has not taken any meds and cannot remember what she took previously. She has had 2 migraines in the last 2 months and took Excedrin for migraine and the headache eventually went away. She wants something to take acutely. She has a history of multiple sclerosis which was diagnosed in 2008. She is currently on Rebif tolerating the medication without significant injection reactions. She does not pretreat. She denies spasms, focal weakness, sensory changes, visual changes, speech or swallowing problems, no problems with bowel or bladder function. She has not fallen, she does not use an assistive device.  She has a history of migraines. Last MRI of the brain 07/02/13 read as abnormal MRI scan of brain showing multilple periventricular and supratentorial white matter lesions consistent with multiple sclerosis. No enhancing lesions are noted. The presence of T 1 black holes indicates chronic disease.Overall mild increase in white matter lesion load compared to MRI 05/24/2007      REVIEW OF SYSTEMS: Full 14 system review of systems performed and notable only for those listed, all others are neg:  Constitutional: N/A  Cardiovascular: N/A  Ear/Nose/Throat: N/A  Skin: N/A  Eyes: N/A  Respiratory: N/A  Gastroitestinal: N/A  Hematology/Lymphatic: N/A  Endocrine: N/A Musculoskeletal:N/A  Allergy/Immunology: N/A  Neurological: Migraine  Psychiatric: N/A Sleep : NA   ALLERGIES: Allergies  Allergen Reactions  . Codeine   . Penicillins     HOME MEDICATIONS: Outpatient Prescriptions Prior to Visit  Medication Sig Dispense Refill  . Fexofenadine HCl (ALLEGRA PO) Take by  mouth.      . interferon beta-1a (REBIF) 44 MCG/0.5ML injection Inject 0.5 mLs (44 mcg total) into the skin 3 (three) times a week.  18 mL  1  . Iron TABS Take by mouth.      . levothyroxine (SYNTHROID, LEVOTHROID) 88 MCG tablet        No facility-administered medications prior to visit.    PAST MEDICAL HISTORY: Past Medical History  Diagnosis Date  . Migraine   . Endometrial cancer   . Multiple sclerosis     PAST SURGICAL HISTORY: Past Surgical History  Procedure Laterality Date  . Vaginal hysterectomy      FAMILY HISTORY: History reviewed. No pertinent family history.  SOCIAL HISTORY: History   Social History  . Marital Status: Married    Spouse Name: [name]    Number of Children: 1  . Years of Education: 14   Occupational History  .  [employer]   Social History Main Topics  . Smoking status: Never Smoker   . Smokeless tobacco: Never Used  . Alcohol Use: Yes     Comment: wine on occ  . Drug Use: No  . Sexual Activity: Not on file   Other Topics Concern  . 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   Patient has one child.   Patient is retired.   Patient has a college education.   Patient is right-handed.   Patient drinks very little caffeine.     PHYSICAL EXAM  Filed Vitals:   12/27/13 0933  BP: 106/67  Pulse: 70  Height:  5\' 6"  (1.676 m)  Weight: 111 lb (50.349 kg)   Body mass index is 17.92 kg/(m^2).  Generalized: Well developed, in no acute distress  Head: normocephalic and atraumatic,. Oropharynx benign  Neck: Supple, no carotid bruits  Cardiac: Regular rate rhythm, no murmur  Musculoskeletal: No deformity   Neurological examination   Mentation: Alert oriented to time, place, history taking. Follows all commands speech and language fluent  Cranial nerve II-XII: Fundoscopic exam reveals sharp disc margins.Pupils were equal round reactive to light extraocular movements were full, visual  field were full on confrontational test. Facial sensation and strength were normal. hearing was intact to finger rubbing bilaterally. Uvula tongue midline. head turning and shoulder shrug were normal and symmetric.Tongue protrusion into cheek strength was normal. Motor: normal bulk and tone, full strength in the BUE, BLE, fine finger movements normal, no pronator drift. No focal weakness Sensory: normal and symmetric to light touch, pinprick, and  vibration  Coordination: finger-nose-finger, heel-to-shin bilaterally, no dysmetria Reflexes: Brachioradialis 2/2, biceps 2/2, triceps 2/2, patellar 2/2, Achilles 2/2, plantar responses were flexor bilaterally. Gait and Station: Rising up from seated position without assistance, normal stance,  moderate stride, good arm swing, smooth turning, able to perform tiptoe, and heel walking without difficulty. Tandem gait is steady  DIAGNOSTIC DATA (LABS, IMAGING, TESTING) - ASSESSMENT AND PLAN  68 y.o. year old female  has a past medical history of Migraine; and Multiple sclerosis. here to followup for her migraines. She has not had any medications acutely in a while and she often has nausea with her migraines.  Try Maxalt 10 mg MLT for migraine Zofran 4 mg when necessary nausea Continue Rebif as ordered Followup in 6 months, [name], [facility], [facility], APRN  [facility] [address], [place], [place] [address] [phone]

## 2014-01-06 DIAGNOSIS — C541 Malignant neoplasm of endometrium: Secondary | ICD-10-CM | POA: Insufficient documentation

## 2014-04-25 ENCOUNTER — Other Ambulatory Visit: Payer: Self-pay

## 2014-04-25 DIAGNOSIS — Z1231 Encounter for screening mammogram for malignant neoplasm of breast: Secondary | ICD-10-CM

## 2014-05-02 ENCOUNTER — Ambulatory Visit
Admission: RE | Admit: 2014-05-02 | Discharge: 2014-05-02 | Disposition: A | Discharge status: Home / Self Care | Payer: Medicare Other | Source: Ambulatory Visit

## 2014-05-02 DIAGNOSIS — Z1231 Encounter for screening mammogram for malignant neoplasm of breast: Secondary | ICD-10-CM

## 2014-06-20 ENCOUNTER — Ambulatory Visit: Payer: Medicare Other | Admitting: Nurse Practitioner

## 2014-06-28 ENCOUNTER — Ambulatory Visit (INDEPENDENT_AMBULATORY_CARE_PROVIDER_SITE_OTHER): Payer: Medicare Other | Admitting: Neurology

## 2014-06-28 ENCOUNTER — Encounter: Payer: Self-pay | Admitting: Neurology

## 2014-06-28 VITALS — BP 107/68 | HR 66 | Temp 97.6°F | Resp 12 | Ht 64.5 in | Wt 105.5 lb

## 2014-06-28 DIAGNOSIS — G43D0 Abdominal migraine, not intractable: Secondary | ICD-10-CM

## 2014-06-28 DIAGNOSIS — G35 Multiple sclerosis: Secondary | ICD-10-CM | POA: Insufficient documentation

## 2014-06-28 MED ORDER — SUMATRIPTAN SUCCINATE 100 MG PO TABS: 100.0000 mg | ORAL_TABLET | ORAL | Status: DC | PRN

## 2014-06-28 MED ORDER — INTERFERON BETA-1A 44 MCG/0.5ML ~~LOC~~ SOSY: PREFILLED_SYRINGE | SUBCUTANEOUS | Status: DC

## 2014-06-28 NOTE — Addendum Note (Signed)
Addended by: Larey Seat on: 06/28/2014 02:43 PM   Modules accepted: Orders

## 2014-06-28 NOTE — Progress Notes (Signed)
[facility]  PATIENT: [name] DOB: Jun 20, 1946   REASON FOR VISIT: Followup for migrainous headaches, in the setting of multiple sclerosis.     HISTORY OF PRESENT ILLNESS: Ms. [name], a 68 year old female , retired , married ,returns for followup.  I originally have seen her as a ED consultation for MS in October 2008.[name]  She was seen a couple of weeks ago by [name], NP  and now has a few  migraines which apparently she has had in the past  however she has not taken any meds and cannot remember what she took previously. She has had 2 migraines in the last 2 months and took Excedrin for migraine and the headache eventually went away. She wants something to take acutely. She has a history of multiple sclerosis which was diagnosed in 2008.  She is currently on Rebif tolerating the medication without significant injection reactions. She does not pretreat.  She denies spasms, focal weakness, sensory changes, visual changes, speech or swallowing problems, no problems with bowel or bladder function. She has not fallen, she does not use an assistive device.  She has a history of migraines. Last MRI of the brain 07/02/13 read as abnormal - MRI scan of brain showing multilple periventricular and supratentorial white matter lesions consistent with multiple sclerosis. No enhancing lesions are noted. The presence of T 1 black holes indicates chronic disease. Overall mild increase in white matter lesion load compared to MRI 05/24/2007.   [name] reports that her headaches do have a preference of side, but she feels very nauseated with her headaches and that she would actually feel some relief if she could vomit. She has associated photophobia, she used to have a visual aura of  Zig zag lines and lights , now it is more of a tunnel vision arising from the periphery centrallywith tiny little light dots. The headaches last with a pounding throbbing character  about 3 hours up to a whole day . She is fatigued afterwards. She is highly sensitive to scents and smells.      REVIEW OF SYSTEMS: Full 14 system review of systems performed and notable only for those listed, all others are neg:  Constitutional: N/A  Cardiovascular: N/A  Ear/Nose/Throat: N/A  Skin: N/A  Eyes: N/A  Respiratory: N/A  Gastroitestinal: N/A  Hematology/Lymphatic: N/A  Endocrine: N/A Musculoskeletal:N/A  Allergy/Immunology: N/A  Neurological: Migraine  Psychiatric: N/A Sleep : NA   ALLERGIES: Allergies  Allergen Reactions  . Codeine   . Penicillins     HOME MEDICATIONS: Outpatient Prescriptions Prior to Visit  Medication Sig Dispense Refill  . Fexofenadine HCl (ALLEGRA PO) Take 1 tablet by mouth as needed (allergies).     . interferon beta-1a (REBIF) 44 MCG/0.5ML injection Inject 0.5 mLs (44 mcg total) into the skin 3 (three) times a week. 18 mL 1  . levothyroxine (SYNTHROID, LEVOTHROID) 88 MCG tablet 88 mcg daily before breakfast. Takes 5 days a week only.    . Iron TABS Take 1 tablet by mouth daily.     . ondansetron (ZOFRAN) 4 MG tablet Take 1 tablet (4 mg total) by mouth every 8 (eight) hours as needed for nausea or vomiting. 20 tablet 1  . rizatriptan (MAXALT-MLT) 10 MG disintegrating tablet Take 1 tablet (10 mg total) by mouth as needed for migraine. May repeat in 2 hours if needed 10 tablet 1   No facility-administered medications prior to visit.    PAST MEDICAL HISTORY: Past Medical History  Diagnosis Date  . Migraine   . Endometrial cancer   . Multiple sclerosis     PAST SURGICAL HISTORY: Past Surgical History  Procedure Laterality Date  . Vaginal hysterectomy      FAMILY HISTORY: Family History  Problem Relation Age of Onset  . Stroke Mother   . Congestive Heart Failure Mother   . Migraines Sister     SOCIAL HISTORY: History   Social History  . Marital Status: Married    Spouse Name: [name]    Number of Children: 1  . Years of  Education: 14   Occupational History  .  [employer]   Social History Main Topics  . Smoking status: Never Smoker   . Smokeless tobacco: Never Used  . Alcohol Use: Yes     Comment: wine on occ  . Drug Use: No  . Sexual Activity: Not on file   Other Topics Concern  . 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   Patient has one child.   Patient is retired.   Patient has a college education.   Patient is right-handed.   Patient drinks very little caffeine.     PHYSICAL EXAM  Filed Vitals:   06/28/14 1350  BP: 107/68  Pulse: 66  Temp: 97.6 F (36.4 C)  TempSrc: Oral  Resp: 12  Height: 5' 4.5" (1.638 m)  Weight: 105 lb 8 oz (47.854 kg)   Body mass index is 17.84 kg/(m^2).  Generalized: Well developed, in no acute distress  Head: normocephalic and atraumatic,. Oropharynx benign  Neck: Supple, no carotid bruits  Cardiac: Regular rate rhythm, no murmur  Musculoskeletal: No deformity   Neurological examination   Mentation: Alert oriented to time, place, history taking. Follows all commands speech and language fluent  Cranial nerve II-XII: Fundoscopic exam reveals sharp disc margins.Pupils were equal round reactive to light extraocular movements were full, visual field were full on confrontational test. Facial sensation and strength were normal. hearing was intact to finger rubbing bilaterally. Uvula tongue midline. head turning and shoulder shrug were normal and symmetric.Tongue protrusion into cheek strength was normal. Motor: normal bulk and tone, full strength in the BUE, BLE,  fine finger movements normal, no pronator drift. No focal weakness Sensory: normal and symmetric to light touch, pinprick, and  vibration  Coordination: finger-nose-finger, heel-to-shin bilaterally, no dysmetria Reflexes:  2/2, Gait and Station: Rising up from seated position without assistance, normal stance, moderate stride, good arm swing,  smooth turning. Tandem gait is steady.  No falls in the last 12 month.   The patient has a fire arm in the house.   DIAGNOSTIC DATA (LABS, IMAGING, TESTING)  This patient will have a migraines once every 2 month. Prevention wiould not be indicated.  For the treatment of migraine, with a tension component arising form the neck to the right temple, I suggest cyclobenzaprine.  She can take this at night. She has been woken by her headaches before, insomnia followed.  I discontinued the Maxalt.   ASSESSMENT AND PLAN  68 y.o. year old female has a past medical history of Migraine; and Multiple sclerosis. Here to followup for her migraines and to get refills on Rebif. .    Continue Rebif as ordered. Amityptilin or Flexaril as night time medication for headaches.   Followup in 6 months with  [name], GNP,   Larey Seat, MD  [facility] [phone]  [place], [place] [address] [phone]

## 2014-08-10 ENCOUNTER — Other Ambulatory Visit: Payer: Self-pay

## 2014-08-10 MED ORDER — INTERFERON BETA-1A 44 MCG/0.5ML ~~LOC~~ SOSY: 44.0000 ug | PREFILLED_SYRINGE | SUBCUTANEOUS | Status: DC

## 2014-08-31 ENCOUNTER — Telehealth: Payer: Self-pay | Admitting: Nurse Practitioner

## 2014-08-31 MED ORDER — INTERFERON BETA-1A 44 MCG/0.5ML ~~LOC~~ SOSY: 44.0000 ug | PREFILLED_SYRINGE | SUBCUTANEOUS | Status: DC

## 2014-08-31 NOTE — Telephone Encounter (Signed)
Rx has been resent for 90 day supply per patient request.  I called back, got no answer.  Left message.

## 2014-08-31 NOTE — Telephone Encounter (Signed)
Pt is calling stating that her interferon beta-1a (REBIF) 44 MCG/0.5ML SOSY injection should be 3 mth supply and should go to [facility].  Please call and advise.

## 2014-12-11 HISTORY — PX: OTHER SURGICAL HISTORY: SHX169

## 2014-12-28 ENCOUNTER — Ambulatory Visit (INDEPENDENT_AMBULATORY_CARE_PROVIDER_SITE_OTHER): Payer: Medicare Other | Admitting: Nurse Practitioner

## 2014-12-28 ENCOUNTER — Encounter: Payer: Self-pay | Admitting: Nurse Practitioner

## 2014-12-28 VITALS — BP 91/53 | HR 77 | Ht 64.5 in | Wt 107.2 lb

## 2014-12-28 DIAGNOSIS — G35 Multiple sclerosis: Secondary | ICD-10-CM | POA: Diagnosis not present

## 2014-12-28 DIAGNOSIS — R519 Headache, unspecified: Secondary | ICD-10-CM

## 2014-12-28 DIAGNOSIS — G43909 Migraine, unspecified, not intractable, without status migrainosus: Secondary | ICD-10-CM

## 2014-12-28 DIAGNOSIS — G43D0 Abdominal migraine, not intractable: Secondary | ICD-10-CM | POA: Diagnosis not present

## 2014-12-28 DIAGNOSIS — R51 Headache: Secondary | ICD-10-CM

## 2014-12-28 MED ORDER — SUMATRIPTAN SUCCINATE 100 MG PO TABS: 100.0000 mg | ORAL_TABLET | ORAL | Status: DC | PRN

## 2014-12-28 NOTE — Progress Notes (Signed)
I agree with the assessment and plan as directed by NP .The patient is known to me and should see me in her next visit.   [name]   Deniqua Perry, MD

## 2014-12-28 NOTE — Patient Instructions (Signed)
Continue Rebif 3 times a week, labs were checked by primary care Continue sumatriptan when necessary acute migraine will refill Follow-up in 6 months

## 2014-12-28 NOTE — Progress Notes (Signed)
[facility]  PATIENT: [name] DOB: [birth year]   REASON FOR VISIT: Follow-up for multiple sclerosis relapsing remitting and migraines HISTORY FROM: Patient    HISTORY OF PRESENT ILLNESS:Ms. [name], a 69 year old female , retired , married ,returns for followup.She was last seen by Dr. Brett Fairy 06/28/2014 and has been followed for multiple sclerosis since October 2008.[name] She has had 2 migraines in the last 2 months when last seen and was placed on Imitrex with good results. She does not want to be on a preventative medication . She has associated photophobia, she used to have a visual aura of Zig zag lines and lights , now it is more of a tunnel vision arising from the periphery centrally with tiny little light dots.The headaches last with a pounding throbbing character about 3 hours up to a whole day . She is fatigued afterwards. She is highly sensitive to scents and smells.  She has about 3 headaches per month maybe less. She is currently on Rebif tolerating the medication without significant injection reactions. She does not pretreat.  She denies spasms, focal weakness, sensory changes, visual changes, speech or swallowing problems, no problems with bowel or bladder function. She has not fallen, she does not use an assistive device. She has a history of migraines. Last MRI of the brain 07/02/13 read as abnormal - MRI scan of brain showing multilple periventricular and supratentorial white matter lesions consistent with multiple sclerosis. No enhancing lesions are noted. The presence of T 1 black holes indicates chronic disease. Overall mild increase in white matter lesion load compared to MRI 05/24/2007.    REVIEW OF SYSTEMS: Full 14 system review of systems performed and notable only for those listed, all others are neg:  Constitutional: neg  Cardiovascular: neg Ear/Nose/Throat: neg  Skin: neg Eyes: Light sensitivity Respiratory:  neg Gastroitestinal: neg  Hematology/Lymphatic: neg  Endocrine: neg Musculoskeletal:neg Allergy/Immunology: neg Neurological: Occasional dizziness with headache Psychiatric: neg Sleep : neg   ALLERGIES: Allergies  Allergen Reactions  . Codeine   . Penicillins     HOME MEDICATIONS: Outpatient Prescriptions Prior to Visit  Medication Sig Dispense Refill  . interferon beta-1a (REBIF) 44 MCG/0.5ML SOSY injection Inject 0.5 mLs (44 mcg total) into the skin 3 (three) times a week. 36 Syringe 3  . SUMAtriptan (IMITREX) 100 MG tablet Take 1 tablet (100 mg total) by mouth every 2 (two) hours as needed for migraine or headache. May repeat in 2 hours if headache persists or recurs. 10 tablet 2   No facility-administered medications prior to visit.    PAST MEDICAL HISTORY: Past Medical History  Diagnosis Date  . Migraine   . Endometrial cancer   . Multiple sclerosis     PAST SURGICAL HISTORY: Past Surgical History  Procedure Laterality Date  . Vaginal hysterectomy      FAMILY HISTORY: Family History  Problem Relation Age of Onset  . Stroke Mother   . Congestive Heart Failure Mother   . Migraines Sister     SOCIAL HISTORY: History   Social History  . Marital Status: Married    Spouse Name: [name]  . Number of Children: 1  . Years of Education: 14   Occupational History  .  [employer]   Social History Main Topics  . Smoking status: Never Smoker   . Smokeless tobacco: Never Used  . Alcohol Use: Yes     Comment: wine on occ  . Drug Use: No  . Sexual Activity: Not on file  Other Topics Concern  . 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   Patient has one child.   Patient is retired.   Patient has a college education.   Patient is right-handed.   Patient drinks very little caffeine.     PHYSICAL EXAM  Filed Vitals:   12/28/14 1354  BP: 91/53  Pulse: 77  Height: 5' 4.5" (1.638 m)  Weight: 107 lb  3.2 oz (48.626 kg)   Body mass index is 18.12 kg/(m^2).  Generalized: Well developed, in no acute distress  Head: normocephalic and atraumatic,. Oropharynx benign  Neck: Supple, no carotid bruits  Musculoskeletal: No deformity   Neurological examination   Mentation: Alert oriented to time, place, history taking. Attention span and concentration appropriate. Recent and remote memory intact.  Follows all commands speech and language fluent.   Cranial nerve II-XII: Fundoscopic exam reveals sharp disc margins.Pupils were equal round reactive to light extraocular movements were full, visual field were full on confrontational test. Facial sensation and strength were normal. hearing was intact to finger rubbing bilaterally. Uvula tongue midline. head turning and shoulder shrug were normal and symmetric.Tongue protrusion into cheek strength was normal. Motor: normal bulk and tone, full strength in the BUE, BLE, fine finger movements normal, no pronator drift. No focal weakness Sensory: normal and symmetric to light touch, pinprick, and  Vibration,  Coordination: finger-nose-finger, heel-to-shin bilaterally, no dysmetria Reflexes: Brachioradialis 2/2, biceps 2/2, triceps 2/2, patellar 2/2, Achilles 2/2, plantar responses were flexor bilaterally. Gait and Station: Rising up from seated position without assistance, normal stance,  moderate stride, good arm swing, smooth turning, able to perform tiptoe, and heel walking without difficulty. Tandem gait is steady  DIAGNOSTIC DATA (LABS, IMAGING, TESTING) - ASSESSMENT AND PLAN  69 y.o. year old female  has a past medical history of Migraine;  and Multiple sclerosis. here to follow-up.  Continue Rebif 3 times a week, labs were checked by primary care Continue sumatriptan when necessary acute migraine will refill Follow-up in 6 months [name], [facility], [facility], APRN  [facility] [address] [place], [place]  [address] [phone]

## 2015-02-06 ENCOUNTER — Other Ambulatory Visit: Payer: Self-pay

## 2015-04-28 ENCOUNTER — Other Ambulatory Visit: Payer: Self-pay

## 2015-04-28 DIAGNOSIS — Z1231 Encounter for screening mammogram for malignant neoplasm of breast: Secondary | ICD-10-CM

## 2015-05-05 ENCOUNTER — Ambulatory Visit
Admission: RE | Admit: 2015-05-05 | Discharge: 2015-05-05 | Disposition: A | Discharge status: Home / Self Care | Payer: Medicare Other | Source: Ambulatory Visit

## 2015-05-05 DIAGNOSIS — Z1231 Encounter for screening mammogram for malignant neoplasm of breast: Secondary | ICD-10-CM

## 2015-06-30 ENCOUNTER — Ambulatory Visit: Payer: Medicare Other | Admitting: Nurse Practitioner

## 2015-07-03 ENCOUNTER — Ambulatory Visit (INDEPENDENT_AMBULATORY_CARE_PROVIDER_SITE_OTHER): Payer: Medicare Other | Admitting: Nurse Practitioner

## 2015-07-03 ENCOUNTER — Encounter: Payer: Self-pay | Admitting: Nurse Practitioner

## 2015-07-03 VITALS — BP 112/63 | HR 59 | Ht 64.5 in | Wt 107.5 lb

## 2015-07-03 DIAGNOSIS — G43909 Migraine, unspecified, not intractable, without status migrainosus: Secondary | ICD-10-CM | POA: Diagnosis not present

## 2015-07-03 DIAGNOSIS — G35 Multiple sclerosis: Secondary | ICD-10-CM | POA: Diagnosis not present

## 2015-07-03 DIAGNOSIS — G43D0 Abdominal migraine, not intractable: Secondary | ICD-10-CM | POA: Diagnosis not present

## 2015-07-03 MED ORDER — SUMATRIPTAN SUCCINATE 100 MG PO TABS: 100.0000 mg | ORAL_TABLET | ORAL | Status: DC | PRN

## 2015-07-03 NOTE — Progress Notes (Signed)
I agree with the assessment and plan as directed by NP .The patient is known to me .   Seleste Tallman, MD  

## 2015-07-03 NOTE — Patient Instructions (Addendum)
Continue Rebif 3 times a week, labs were checked by primary care Continue sumatriptan when necessary acute migraine will refill Follow-up in 6 months 

## 2015-07-03 NOTE — Progress Notes (Signed)
[facility]  PATIENT: [name] DOB: [birth year]   REASON FOR VISIT: Follow-up for multiple sclerosis, migraine HISTORY FROM: Patient    HISTORY OF PRESENT ILLNESS:[name], a 69 year old female , retired , married female ,returns for followup.She was last seen 12/28/14 and has been followed for multiple sclerosis since October 2008. She has had 2 migraines in the last 6 months . She is  on Imitrex with good results. She does not want to be on a preventative medication . She has associated photophobia, she used to have a visual aura of [name] lines and lights , now it is more of a tunnel vision arising from the periphery centrally with tiny little light dots.The headaches last with a pounding throbbing character about 3 hours up to a whole day . She is fatigued afterwards. She is highly sensitive to scents and smells.  She is currently on Rebif tolerating the medication without significant injection reactions. She does not pretreat.  She denies spasms, focal weakness, sensory changes, visual changes, speech or swallowing problems, no problems with bowel or bladder function. She has not fallen, she does not use an assistive device.  Last MRI of the brain 07/02/13 read as abnormal - MRI scan of brain showing multilple periventricular and supratentorial white matter lesions consistent with multiple sclerosis. No enhancing lesions are noted. The presence of T 1 black holes indicates chronic disease. Overall mild increase in white matter lesion load compared to MRI 05/24/2007. She returns for reevaluation   REVIEW OF SYSTEMS: Full 14 system review of systems performed and notable only for those listed, all others are neg:  Constitutional: neg  Cardiovascular: neg Ear/Nose/Throat: neg  Skin: neg Eyes: neg Respiratory: neg Gastroitestinal: neg  Hematology/Lymphatic: neg  Endocrine: neg Musculoskeletal:neg Allergy/Immunology: neg Neurological:  neg Psychiatric: neg Sleep : neg   ALLERGIES: Allergies  Allergen Reactions  . Codeine   . Penicillins     HOME MEDICATIONS: Outpatient Prescriptions Prior to Visit  Medication Sig Dispense Refill  . Cholecalciferol (VITAMIN D3) 2000 UNITS capsule Take 50,000 tablets by mouth.    . DUREZOL 0.05 % EMUL   1  . gatifloxacin (ZYMAXID) 0.5 % SOLN   1  . interferon beta-1a (REBIF) 44 MCG/0.5ML SOSY injection Inject 0.5 mLs (44 mcg total) into the skin 3 (three) times a week. 36 Syringe 3  . levothyroxine (SYNTHROID, LEVOTHROID) 88 MCG tablet     . SUMAtriptan (IMITREX) 100 MG tablet Take 1 tablet (100 mg total) by mouth every 2 (two) hours as needed for migraine or headache. May repeat in 2 hours if headache persists or recurs. 10 tablet 6   No facility-administered medications prior to visit.    PAST MEDICAL HISTORY: Past Medical History  Diagnosis Date  . Migraine   . Endometrial cancer ([name])   . Multiple sclerosis ([name])     PAST SURGICAL HISTORY: Past Surgical History  Procedure Laterality Date  . Vaginal hysterectomy    . Salzman nodules  12/2014    Had one eye then the other on 01/2015  . Cataracts      FAMILY HISTORY: Family History  Problem Relation Age of Onset  . Stroke Mother   . Congestive Heart Failure Mother   . Migraines Sister     SOCIAL HISTORY: Social History   Social History  . Marital Status: Married    Spouse Name: [name]  . Number of Children: 1  . Years of Education: 14   Occupational History  .  [employer]   Social History Main Topics  . Smoking status: Never Smoker   . Smokeless tobacco: Never Used  . Alcohol Use: Yes     Comment: wine on occ  . Drug Use: No  . Sexual Activity: Not on file   Other Topics Concern  . 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   Patient has one child.   Patient is retired.   Patient has a college education.   Patient is right-handed.   Patient  drinks very little caffeine.     PHYSICAL EXAM  Filed Vitals:   07/03/15 0858  BP: 112/63  Pulse: 59  Height: 5' 4.5" (1.638 m)  Weight: 107 lb 8 oz (48.762 kg)   Body mass index is 18.17 kg/(m^2). Generalized: Well developed, in no acute distress  Head: normocephalic and atraumatic,. Oropharynx benign  Neck: Supple, no carotid bruits  Musculoskeletal: No deformity   Neurological examination   Mentation: Alert oriented to time, place, history taking. Attention span and concentration appropriate. Recent and remote memory intact. Follows all commands speech and language fluent.   Cranial nerve II-XII: Fundoscopic exam reveals sharp disc margins.Pupils were equal round reactive to light extraocular movements were full, visual field were full on confrontational test. Facial sensation and strength were normal. hearing was intact to finger rubbing bilaterally. Uvula tongue midline. head turning and shoulder shrug were normal and symmetric.Tongue protrusion into cheek strength was normal. Motor: normal bulk and tone, full strength in the BUE, BLE, fine finger movements normal, no pronator drift. No focal weakness Sensory: normal and symmetric to light touch, pinprick, and Vibration,  Coordination: finger-nose-finger, heel-to-shin bilaterally, no dysmetria Reflexes: Brachioradialis 2/2, biceps 2/2, triceps 2/2, patellar 2/2, Achilles 2/2, plantar responses were flexor bilaterally. Gait and Station: Rising up from seated position without assistance, normal stance, moderate stride, good arm swing, smooth turning, able to perform tiptoe, and heel walking without difficulty. Tandem gait is steady  DIAGNOSTIC DATA (LABS, IMAGING, TESTING) -   ASSESSMENT AND PLAN  69 y.o. year old female  has a past medical history of Migraine;; and Multiple sclerosis ([name]). here to follow-up. She has not had exacerbation of MS symptoms since last seen. Imitrex is used acutely for migraines  Continue  Rebif 3 times a week, labs were checked by primary care, next due in January 2017 Continue sumatriptan when necessary acute migraine will refill Follow-up in 6 months [name] Bible, [facility], [facility], APRN  [facility] [address] [place], [place] [address] [phone]

## 2015-07-28 ENCOUNTER — Telehealth: Payer: Self-pay | Admitting: Neurology

## 2015-07-28 NOTE — Telephone Encounter (Signed)
Daughter Silva Bandy is calling. She is on the Dpr. Patient has laryngitis, has a migraine, can't keep anything down, she has been throwing up. Patient is refusing to go ED.   Spoke to patient. She is not feeling well. Started vomiting yesterday, terrible headache, can't eat, has not taken in fluids since yesterday. She can't take imitrex or any meds because she throws them up. Advised to go to the ED.

## 2015-08-19 ENCOUNTER — Other Ambulatory Visit: Payer: Self-pay

## 2015-08-19 MED ORDER — INTERFERON BETA-1A 44 MCG/0.5ML ~~LOC~~ SOSY: 44.0000 ug | PREFILLED_SYRINGE | SUBCUTANEOUS | Status: DC

## 2015-10-19 ENCOUNTER — Other Ambulatory Visit: Payer: Self-pay | Admitting: Gastroenterology

## 2015-11-29 ENCOUNTER — Encounter (HOSPITAL_COMMUNITY): Payer: Self-pay | Admitting: *Deleted

## 2015-12-11 ENCOUNTER — Encounter (HOSPITAL_COMMUNITY): Payer: Self-pay | Admitting: *Deleted

## 2015-12-11 ENCOUNTER — Encounter (HOSPITAL_COMMUNITY)
Admission: RE | Disposition: A | Discharge status: Home / Self Care | Payer: Self-pay | Source: Ambulatory Visit | Attending: Gastroenterology

## 2015-12-11 ENCOUNTER — Ambulatory Visit (HOSPITAL_COMMUNITY)
Admission: RE | Admit: 2015-12-11 | Discharge: 2015-12-11 | Disposition: A | Discharge status: Home / Self Care | Payer: Medicare Other | Source: Ambulatory Visit | Attending: Gastroenterology | Admitting: Gastroenterology

## 2015-12-11 ENCOUNTER — Ambulatory Visit (HOSPITAL_COMMUNITY): Payer: Medicare Other | Admitting: Anesthesiology

## 2015-12-11 DIAGNOSIS — Z1211 Encounter for screening for malignant neoplasm of colon: Secondary | ICD-10-CM | POA: Insufficient documentation

## 2015-12-11 DIAGNOSIS — E039 Hypothyroidism, unspecified: Secondary | ICD-10-CM | POA: Insufficient documentation

## 2015-12-11 DIAGNOSIS — Z79899 Other long term (current) drug therapy: Secondary | ICD-10-CM | POA: Diagnosis not present

## 2015-12-11 DIAGNOSIS — G35 Multiple sclerosis: Secondary | ICD-10-CM | POA: Diagnosis not present

## 2015-12-11 HISTORY — PX: COLONOSCOPY WITH PROPOFOL: SHX5780

## 2015-12-11 HISTORY — DX: Family history of other specified conditions: Z84.89

## 2015-12-11 SURGERY — COLONOSCOPY WITH PROPOFOL
Anesthesia: Monitor Anesthesia Care

## 2015-12-11 MED ORDER — SODIUM CHLORIDE 0.9 % IV SOLN: INTRAVENOUS | Status: DC

## 2015-12-11 MED ORDER — PROPOFOL 500 MG/50ML IV EMUL
INTRAVENOUS | Status: DC | PRN
  Administered 2015-12-11: 20 mg via INTRAVENOUS
  Administered 2015-12-11: 50 mg via INTRAVENOUS

## 2015-12-11 MED ORDER — LIDOCAINE HCL (CARDIAC) 20 MG/ML IV SOLN
INTRAVENOUS | Status: AC
  Filled 2015-12-11: qty 5

## 2015-12-11 MED ORDER — LACTATED RINGERS IV SOLN
INTRAVENOUS | Status: DC
  Administered 2015-12-11: 1000 mL via INTRAVENOUS

## 2015-12-11 MED ORDER — PROPOFOL 500 MG/50ML IV EMUL
INTRAVENOUS | Status: DC | PRN
  Administered 2015-12-11: 100 ug/kg/min via INTRAVENOUS

## 2015-12-11 MED ORDER — PROPOFOL 10 MG/ML IV BOLUS
INTRAVENOUS | Status: AC
  Filled 2015-12-11: qty 40

## 2015-12-11 SURGICAL SUPPLY — 22 items

## 2015-12-11 NOTE — Discharge Instructions (Signed)

## 2015-12-11 NOTE — Op Note (Signed)
[facility] Patient Name: [name] Procedure Date: 12/11/2015 MRN: [id number] Attending MD: Garlan Fair , MD Date of Birth: 1946-04-17 CSN:  Age: 70 Admit Type: Outpatient Procedure:                Colonoscopy Indications:              Screening for colorectal malignant neoplasm. Normal                            screening colonoscopy was performed on 04/29/2005 Providers:                Garlan Fair, MD, Laverta Baltimore, RN, Alfonso Patten, Technician, Rosario Adie, CRNA Referring MD:              Medicines:                Propofol per Anesthesia Complications:            No immediate complications. Estimated Blood Loss:     Estimated blood loss: none. Procedure:                Pre-Anesthesia Assessment:                           - Prior to the procedure, a History and Physical                            was performed, and patient medications and                            allergies were reviewed. The patient's tolerance of                            previous anesthesia was also reviewed. The risks                            and benefits of the procedure and the sedation                            options and risks were discussed with the patient.                            All questions were answered, and informed consent                            was obtained. Prior Anticoagulants: The patient has                            taken no previous anticoagulant or antiplatelet                            agents. ASA Grade Assessment: III - A patient with  severe systemic disease. After reviewing the risks                            and benefits, the patient was deemed in                            satisfactory condition to undergo the procedure.                           After obtaining informed consent, the colonoscope                            was passed under direct vision. Throughout the                       procedure, the patient's blood pressure, pulse, and                            oxygen saturations were monitored continuously. The                            [id number]) scope was introduced through                            the anus and advanced to the the cecum, identified                            by appendiceal orifice and ileocecal valve. The                            colonoscopy was performed without difficulty. The                            patient tolerated the procedure well. The quality                            of the bowel preparation was good. The ileocecal                            valve, the appendiceal orifice and the rectum were                            photographed. Scope In: 9:09:15 AM Scope Out: 9:24:59 AM Scope Withdrawal Time: 0 hours 10 minutes 54 seconds  Total Procedure Duration: 0 hours 15 minutes 44 seconds  Findings:      The perianal and digital rectal examinations were normal.      The entire examined colon appeared normal. Left colonic divertiulosis       was present Impression:               - The entire examined colon is normal.                           - No specimens collected. Moderate Sedation:      N/A- Per Anesthesia Care Recommendation:           -  Patient has a contact number available for                            emergencies. The signs and symptoms of potential                            delayed complications were discussed with the                            patient. Return to normal activities tomorrow.                            Written discharge instructions were provided to the                            patient.                           - Repeat colonoscopy in 10 years for screening                            purposes.                           - Resume previous diet.                           - Continue present medications. Procedure Code(s):        --- Professional ---                            [phone], Colonoscopy, flexible; diagnostic, including                            collection of specimen(s) by brushing or washing,                            when performed (separate procedure) Diagnosis Code(s):        --- Professional ---                           Z12.11, Encounter for screening for malignant                            neoplasm of colon CPT copyright 2016 American [employer] Association. All rights reserved. The codes documented in this report are preliminary and upon coder review may  be revised to meet current compliance requirements. Earle Gell, MD Garlan Fair, MD 12/11/2015 9:30:31 AM This report has been signed electronically. Number of Addenda: 0

## 2015-12-11 NOTE — Transfer of Care (Signed)
Immediate Anesthesia Transfer of Care Note  Patient: [name]  Procedure(s) Performed: Procedure(s): COLONOSCOPY WITH PROPOFOL (N/A)  Patient Location: PACU  Anesthesia Type:MAC  Level of Consciousness: awake, alert  and oriented  Airway & Oxygen Therapy: Patient Spontanous Breathing and Patient connected to face mask oxygen  Post-op Assessment: Report given to RN and Post -op Vital signs reviewed and stable  Post vital signs: Reviewed and stable  Last Vitals:  Filed Vitals:   12/11/15 0803  BP: 115/49  Temp: 36.6 C  Resp: 12    Last Pain: There were no vitals filed for this visit.       Complications: No apparent anesthesia complications

## 2015-12-11 NOTE — Anesthesia Preprocedure Evaluation (Addendum)
Anesthesia Evaluation  Patient identified by MRN, date of birth, ID band Patient awake    Airway Mallampati: II  TM Distance: >3 FB Neck ROM: Full    Dental   Pulmonary neg pulmonary ROS,    breath sounds clear to auscultation       Cardiovascular negative cardio ROS   Rhythm:Regular Rate:Normal     Neuro/Psych    GI/Hepatic Neg liver ROS, GI history noted. CE   Endo/Other  negative endocrine ROS  Renal/GU negative Renal ROS     Musculoskeletal   Abdominal   Peds  Hematology   Anesthesia Other Findings   Reproductive/Obstetrics                            Anesthesia Physical Anesthesia Plan  ASA: III  Anesthesia Plan: MAC   Post-op Pain Management:    Induction: Intravenous  Airway Management Planned: Simple Face Mask  Additional Equipment:   Intra-op Plan:   Post-operative Plan:   Informed Consent: I have reviewed the patients History and Physical, chart, labs and discussed the procedure including the risks, benefits and alternatives for the proposed anesthesia with the patient or authorized representative who has indicated his/her understanding and acceptance.   Dental advisory given  Plan Discussed with: CRNA and Anesthesiologist  Anesthesia Plan Comments:         Anesthesia Quick Evaluation

## 2015-12-11 NOTE — Anesthesia Postprocedure Evaluation (Signed)
Anesthesia Post Note  Patient: [name]  Procedure(s) Performed: Procedure(s) (LRB): COLONOSCOPY WITH PROPOFOL (N/A)  Patient location during evaluation: PACU Anesthesia Type: MAC Level of consciousness: awake Pain management: pain level controlled Vital Signs Assessment: post-procedure vital signs reviewed and stable Respiratory status: spontaneous breathing Cardiovascular status: stable Anesthetic complications: no    Last Vitals:  Filed Vitals:   12/11/15 0950 12/11/15 0955  BP: 118/55 113/62  Pulse: 55   Temp:    Resp: 14     Last Pain: There were no vitals filed for this visit.               EDWARDS,Makhia Vosler

## 2015-12-12 ENCOUNTER — Encounter (HOSPITAL_COMMUNITY): Payer: Self-pay | Admitting: Gastroenterology

## 2016-01-02 ENCOUNTER — Ambulatory Visit: Payer: Medicare Other | Admitting: Nurse Practitioner

## 2016-01-12 ENCOUNTER — Ambulatory Visit (INDEPENDENT_AMBULATORY_CARE_PROVIDER_SITE_OTHER): Payer: Medicare Other | Admitting: Nurse Practitioner

## 2016-01-12 ENCOUNTER — Encounter: Payer: Self-pay | Admitting: Nurse Practitioner

## 2016-01-12 VITALS — BP 94/57 | HR 68 | Ht 64.5 in | Wt 109.0 lb

## 2016-01-12 DIAGNOSIS — R519 Headache, unspecified: Secondary | ICD-10-CM

## 2016-01-12 DIAGNOSIS — R209 Unspecified disturbances of skin sensation: Secondary | ICD-10-CM | POA: Diagnosis not present

## 2016-01-12 DIAGNOSIS — G35 Multiple sclerosis: Secondary | ICD-10-CM | POA: Diagnosis not present

## 2016-01-12 DIAGNOSIS — R51 Headache: Secondary | ICD-10-CM

## 2016-01-12 DIAGNOSIS — G43D0 Abdominal migraine, not intractable: Secondary | ICD-10-CM

## 2016-01-12 MED ORDER — INTERFERON BETA-1A 44 MCG/0.5ML ~~LOC~~ SOSY: 44.0000 ug | PREFILLED_SYRINGE | SUBCUTANEOUS | Status: DC

## 2016-01-12 MED ORDER — SUMATRIPTAN SUCCINATE 100 MG PO TABS: 100.0000 mg | ORAL_TABLET | ORAL | Status: DC | PRN

## 2016-01-12 NOTE — Progress Notes (Signed)
[facility]  PATIENT: Mckalyn Willenberg Trigo DOB: 1945-11-03   REASON FOR VISIT: Follow-up for multiple sclerosis, migraine HISTORY FROM: Patient    HISTORY OF PRESENT ILLNESS:Ms. Ryans, a 70 year old female , retired , married female ,returns for followup.She was last seen 07/03/15 and has been followed for multiple sclerosis since October 2008. She has had 2 migraines in the past month, worse during allergy season  She is on Imitrex with good results. She does not want to be on a preventative medication . She has associated photophobia, she used to have a visual aura of Zig zag lines and lights , now it is more of a tunnel vision arising from the periphery centrally with tiny little light dots.The headaches last with a pounding throbbing character about 3 hours up to a whole day . She is fatigued afterwards. She is highly sensitive to scents and smells.  She is currently on Rebif tolerating the medication without significant injection reactions. She does not pretreat. She denies spasms, focal weakness, sensory changes, visual changes, speech or swallowing problems, no problems with bowel or bladder function. She has not fallen, she does not use an assistive device.  Last MRI of the brain 07/02/13 read as abnormal - MRI scan of brain showing multilple periventricular and supratentorial white matter lesions consistent with multiple sclerosis. No enhancing lesions are noted. The presence of T 1 black holes indicates chronic disease. Overall mild increase in white matter lesion load compared to MRI 05/24/2007. She returns for reevaluation   REVIEW OF SYSTEMS: Full 14 system review of systems performed and notable only for those listed, all others are neg:  Constitutional: neg  Cardiovascular: neg Ear/Nose/Throat: neg  Skin: neg Eyes: neg Respiratory: neg Gastroitestinal: neg  Hematology/Lymphatic: neg  Endocrine: neg Musculoskeletal:neg Allergy/Immunology:  neg Neurological: neg Psychiatric: neg Sleep : neg   ALLERGIES: Allergies  Allergen Reactions  . Penicillins Rash    As a child. Has patient had a PCN reaction causing immediate rash, facial/tongue/throat swelling, SOB or lightheadedness with hypotension: No Has patient had a PCN reaction causing severe rash involving mucus membranes or skin necrosis: No Has patient had a PCN reaction that required hospitalization: No Has patient had a PCN reaction occurring within the last 10 years: No If all of the above answers are "NO", then may proceed with Cephalosporin use.   . Codeine Nausea And Vomiting    HOME MEDICATIONS: Outpatient Prescriptions Prior to Visit  Medication Sig Dispense Refill  . interferon beta-1a (REBIF) 44 MCG/0.5ML SOSY injection Inject 0.5 mLs (44 mcg total) into the skin 3 (three) times a week. (Patient taking differently: Inject 44 mcg into the skin 3 (three) times a week. Mon, Wed, Fri) 36 Syringe 0  . levothyroxine (SYNTHROID, LEVOTHROID) 88 MCG tablet Take 88 mcg by mouth See admin instructions. Takes before breakfast Monday through Friday only.    . Multiple Vitamin (MULTIVITAMIN WITH MINERALS) TABS tablet Take 1 tablet by mouth daily.    . SUMAtriptan (IMITREX) 100 MG tablet Take 1 tablet (100 mg total) by mouth every 2 (two) hours as needed for migraine or headache. May repeat in 2 hours if headache persists or recurs. 10 tablet 11  . Vitamin D, Ergocalciferol, (DRISDOL) 50000 units CAPS capsule Take 50,000 Units by mouth See admin instructions. Twice on month on the 15th and 30th.     No facility-administered medications prior to visit.    PAST MEDICAL HISTORY: Past Medical History  Diagnosis Date  . Migraine   .  Endometrial cancer ([place])   . Multiple sclerosis ([place])   . Family history of adverse reaction to anesthesia     mother ponv    PAST SURGICAL HISTORY: Past Surgical History  Procedure Laterality Date  . Salzman nodules  12/2014    Had one  eye then the other on 01/2015  . Cataracts Bilateral   . Vaginal hysterectomy      complete  . Tonsillectomy    . Colonoscopy with propofol N/A 12/11/2015    Procedure: COLONOSCOPY WITH PROPOFOL;  Surgeon: Garlan Fair, MD;  Location: [facility];  Service: Endoscopy;  Laterality: N/A;    FAMILY HISTORY: Family History  Problem Relation Age of Onset  . Stroke Mother   . Congestive Heart Failure Mother   . Migraines Sister     SOCIAL HISTORY: Social History   Social History  . Marital Status: Married    Spouse Name: [name]  . Number of Children: 1  . Years of Education: 14   Occupational History  .  [employer]   Social History Main Topics  . Smoking status: Never Smoker   . Smokeless tobacco: Never Used  . Alcohol Use: 0.6 oz/week    1 Glasses of wine per week     Comment: wine on occ  . Drug Use: No  . Sexual Activity: Not on file   Other Topics Concern  . 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   Patient has one child.   Patient is retired.   Patient has a college education.   Patient is right-handed.   Patient drinks very little caffeine.     PHYSICAL EXAM  Filed Vitals:   01/12/16 1049  BP: 94/57  Pulse: 68  Height: 5' 4.5" (1.638 m)  Weight: 109 lb (49.442 kg)   Body mass index is 18.43 kg/(m^2). Generalized: Well developed, in no acute distress  Head: normocephalic and atraumatic,. Oropharynx benign  Neck: Supple, no carotid bruits  Musculoskeletal: No deformity   Neurological examination   Mentation: Alert oriented to time, place, history taking. Attention span and concentration appropriate. Recent and remote memory intact. Follows all commands speech and language fluent.   Cranial nerve II-XII: Fundoscopic exam reveals sharp disc margins.Pupils were equal round reactive to light extraocular movements were full, visual field were full on confrontational test. Facial sensation and  strength were normal. hearing was intact to finger rubbing bilaterally. Uvula tongue midline. head turning and shoulder shrug were normal and symmetric.Tongue protrusion into cheek strength was normal. Motor: normal bulk and tone, full strength in the BUE, BLE, fine finger movements normal, no pronator drift. No focal weakness Sensory: normal and symmetric to light touch, pinprick, and Vibration,  Coordination: finger-nose-finger, heel-to-shin bilaterally, no dysmetria Reflexes: Brachioradialis 2/2, biceps 2/2, triceps 2/2, patellar 2/2, Achilles 2/2, plantar responses were flexor bilaterally. Gait and Station: Rising up from seated position without assistance, normal stance, moderate stride, good arm swing, smooth turning, able to perform tiptoe, and heel walking without difficulty. Tandem gait is steady DIAGNOSTIC DATA (LABS, IMAGING, TESTING) -CBC 10/18/2017 within normal limits from [place] Comprehensive metabolic profile drawn 0000000 within normal limits from [place] PLAN 70 y.o. year old female has a past medical history of Migraine;; and Multiple sclerosis ([place]). here to follow-up. She has not had exacerbation of MS symptoms since last seen. Imitrex is used acutely for migraines  PLAN: Continue Rebif 3 times a  week, labs reviewed will refill Continue sumatriptan when necessary acute migraine will refill Exercise for overall health, healthy diet Follow-up in 6 months Next with Dr. Brett Fairy [name], [facility], [facility], [facility] [address], [place] [employer] [place], [place] [address] [phone]

## 2016-01-12 NOTE — Progress Notes (Signed)
I agree with the assessment and plan as directed by NP .The patient is known to me . Alternating visit with me and NP.    Ameira Alessandrini, MD

## 2016-01-12 NOTE — Patient Instructions (Signed)
Continue Rebif 3 times a week, labs reviewed will refill Continue sumatriptan when necessary acute migraine will refill Exercise for overall health, healthy diet Follow-up in 6 months Next with Dr. Brett Fairy

## 2016-02-26 NOTE — H&P (Signed)
Procedure: Screening colonoscopy. 04/29/2005 normal screening colonoscopy was performed.  History: The patient is a 70 year old female born May 07, 1946. She is scheduled to undergo a repeat screening colonoscopy today.  Past medical history: Multiple sclerosis diagnosed in 2008. Endometrial adenocarcinoma. Hyperthyroidism treated with radioactive iodine resulting in hypothyroidism. Total abdominal hysterectomy.  Medication allergies: Penicillin  Family history: Negative for colon cancer.  Plan: Proceed with repeat screening colonoscopy.

## 2016-04-29 ENCOUNTER — Other Ambulatory Visit: Payer: Self-pay

## 2016-04-29 DIAGNOSIS — Z1231 Encounter for screening mammogram for malignant neoplasm of breast: Secondary | ICD-10-CM

## 2016-05-06 ENCOUNTER — Ambulatory Visit
Admission: RE | Admit: 2016-05-06 | Discharge: 2016-05-06 | Disposition: A | Discharge status: Home / Self Care | Payer: Medicare Other | Source: Ambulatory Visit

## 2016-05-06 DIAGNOSIS — Z1231 Encounter for screening mammogram for malignant neoplasm of breast: Secondary | ICD-10-CM

## 2016-07-15 ENCOUNTER — Ambulatory Visit: Payer: Medicare Other | Admitting: Neurology

## 2016-07-22 ENCOUNTER — Other Ambulatory Visit: Payer: Self-pay | Admitting: *Deleted

## 2016-07-22 MED ORDER — INTERFERON BETA-1A 44 MCG/0.5ML ~~LOC~~ SOSY: 44.0000 ug | PREFILLED_SYRINGE | SUBCUTANEOUS | 1 refills | Status: DC

## 2016-08-20 ENCOUNTER — Ambulatory Visit (INDEPENDENT_AMBULATORY_CARE_PROVIDER_SITE_OTHER): Payer: Medicare Other | Admitting: Neurology

## 2016-08-20 ENCOUNTER — Encounter: Payer: Self-pay | Admitting: Neurology

## 2016-08-20 VITALS — BP 99/58 | HR 71 | Resp 20 | Ht 64.0 in | Wt 104.0 lb

## 2016-08-20 DIAGNOSIS — G35 Multiple sclerosis: Secondary | ICD-10-CM | POA: Diagnosis not present

## 2016-08-20 NOTE — Progress Notes (Signed)
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 She was  seen  In 2017  and has been followed for multiple sclerosis since October 2008.  She has had 2 migraines in the past  3 month, worse during allergy season,   She ison Imitrex with good results. She does not want to be on a preventative medication . She has associated photophobia, she used to have a visual aura of Zig zag lines and lights , now it is more of a tunnel vision arising from the periphery centrally with tiny little light dots.The headaches last with a pounding throbbing character about 3 hours up to a whole da . She is fatigued afterwards. She is highly sensitive to scents and smells.   She is currently on Rebif tolerating the medication without significant injection reactions. She does not pretreat. She denies spasms, focal weakness, sensory changes, visual changes, speech or swallowing problems, no problems with bowel or bladder function. She has not fallen, she does not use an assistive device.  Last MRI of the brain 07/02/13 read as abnormal - MRI scan of brain showing multilple periventricular and supratentorial white matter lesions consistent with multiple sclerosis. No enhancing lesions are noted. The presence of T 1 black holes indicates chronic disease. Overall mild increase in white matter lesion load compared to MRI 05/24/2007. She returns for reevaluation. No MRI since 2014 and no clinical relapse signs.     REVIEW OF SYSTEMS: Full 14 system review of systems performed and notable only for those listed, all others are neg:    ALLERGIES: Allergies  Allergen Reactions  . Penicillins Rash    As a child. Has patient had a PCN reaction causing immediate rash,  facial/tongue/throat swelling, SOB or lightheadedness with hypotension: No Has patient had a PCN reaction causing severe rash involving mucus membranes or skin necrosis: No Has patient had a PCN reaction that required hospitalization: No Has patient had a PCN reaction occurring within the last 10 years: No If all of the above answers are "NO", then may proceed with Cephalosporin use.   . Codeine Nausea And Vomiting    HOME MEDICATIONS: Outpatient Medications Prior to Visit  Medication Sig Dispense Refill  . interferon beta-1a (REBIF) 44 MCG/0.5ML SOSY injection Inject 0.5 mLs (44 mcg total) into the skin 3 (three) times a week. Mon, Wed, Fri 36 Syringe 1  . levothyroxine (SYNTHROID, LEVOTHROID) 88 MCG tablet Take 88 mcg by mouth See admin instructions. Takes before breakfast Monday through Friday only.    . Multiple Vitamin (MULTIVITAMIN WITH MINERALS) TABS tablet Take 1 tablet by mouth daily.    . SUMAtriptan (IMITREX) 100 MG tablet Take 1 tablet (100 mg total) by mouth every 2 (two) hours as needed for migraine or headache. May repeat in 2 hours if headache persists or recurs. 10 tablet 11  . Vitamin D, Ergocalciferol, (DRISDOL) 50000 units CAPS capsule Take 50,000 Units by mouth See admin instructions. Twice on month on the 15th and 30th.     No facility-administered medications prior to visit.     PAST MEDICAL HISTORY: Past Medical History:  Diagnosis Date  . Endometrial cancer ([place])   . Family history of adverse reaction to anesthesia    mother ponv  . Migraine   . Multiple sclerosis (  [place])     PAST SURGICAL HISTORY: Past Surgical History:  Procedure Laterality Date  . cataracts Bilateral   . COLONOSCOPY WITH PROPOFOL N/A 12/11/2015   Procedure: COLONOSCOPY WITH PROPOFOL;  Surgeon: Garlan Fair, MD;  Location: [facility];  Service: Endoscopy;  Laterality: N/A;  . salzman nodules  12/2014   Had one eye then the other on 01/2015  . TONSILLECTOMY    . VAGINAL HYSTERECTOMY      complete    FAMILY HISTORY: Family History  Problem Relation Age of Onset  . Stroke Mother   . Congestive Heart Failure Mother   . Migraines Sister     SOCIAL HISTORY: Social History   Social History  . Marital status: Married    Spouse name: [name]  . Number of children: 1  . Years of education: 22   Occupational History  .  [employer]   Social History Main Topics  . Smoking status: Never Smoker  . Smokeless tobacco: Never Used  . Alcohol use 0.6 oz/week    1 Glasses of wine per week     Comment: wine on occ  . Drug use: No  . Sexual activity: Not on file   Other Topics Concern  . 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   Patient has one child.   Patient is retired.   Patient has a college education.   Patient is right-handed.   Patient drinks very little caffeine.     PHYSICAL EXAM  Vitals:   08/20/16 1329  BP: (!) 99/58  Pulse: 71  Resp: 20  Weight: 104 lb (47.2 kg)  Height: 5\' 4"  (1.626 m)   Body mass index is 17.85 kg/m. Generalized: Well developed, in no acute distress  Head: normocephalic and atraumatic,. Oropharynx benign  Neck: Supple, no carotid bruits  Musculoskeletal: No deformity   Neurological examination   Mentation: Alert oriented to time, place, history taking. Attention span and concentration appropriate. Recent and remote memory intact. Follows all commands speech and language fluent.   Cranial nerve ; patient has no change of taste or smell sensation. Pupils were equal round reactive to light extraocular movements were full, visual field were full on confrontational test. Facial sensation and strength were normal. hearing was intact to finger rubbing bilaterally. Uvula tongue midline. head turning and shoulder shrug were normal and symmetric.Tongue protrusion into cheek strength was normal. Motor: normal bulk and tone, full strength in the BUE, BLE, fine finger movements  normal, no pronator drift. No focal weakness  Sensory:  symmetric to light touch, pinprick, and Vibration,  Coordination: finger-nose-finger, bilaterally, no dysmetria- left hand is always a little clumsy. Reflexes: 2/2, plantar responses were flexor bilaterally. Gait and Station: Rising up from seated position without assistance, normal stance, moderate stride, good arm swing, smooth turning, able to perform tiptoe, and heel walking without difficulty. Tandem gait is steady.  DIAGNOSTIC DATA (LABS, IMAGING, TESTING)  -CBC 10/18/2017 within normal limits from [place] [facility] Comprehensive metabolic profile drawn 0000000 within normal limits from [place] 71 y.o. year old female has a past medical history of Migraine; and a diagnosis of relapsing remitting , but stable multiple sclerosis ([place]).   here for routine  follow-up. She has not had exacerbation of MS symptoms since 2014. Imitrex is used acutely for migraines. Diagnosed in October 2008 .    PLAN: Continue Rebif 3 times a week, labs reviewed  will refill- Continue sumatriptan when necessary acute migraine ( about 1 time a month ) will refill Exercise for overall health, healthy diet. Advised to hydrate - patient is concerned about urinary urge incontinence.   Follow-up in 12 months  with Meryl Crutch, MD  [facility] [address], [place], [place] [address] [phone]   PCP : Dr Maxwell Caul, now Donnald Garre, MD

## 2016-08-20 NOTE — Patient Instructions (Signed)
Continue on Rebif, Rv in 12 month.

## 2016-08-21 LAB — CBC WITH DIFFERENTIAL/PLATELET
BASOS ABS: 0 10*3/uL (ref 0.0–0.2)
Basos: 1 %
EOS (ABSOLUTE): 0.3 10*3/uL (ref 0.0–0.4)
Eos: 4 %
HEMOGLOBIN: 13 g/dL (ref 11.1–15.9)
Hematocrit: 38.6 % (ref 34.0–46.6)
Immature Grans (Abs): 0 10*3/uL (ref 0.0–0.1)
Immature Granulocytes: 0 %
LYMPHS ABS: 1.6 10*3/uL (ref 0.7–3.1)
Lymphs: 25 %
MCH: 33.8 pg — AB (ref 26.6–33.0)
MCHC: 33.7 g/dL (ref 31.5–35.7)
MCV: 100 fL — ABNORMAL HIGH (ref 79–97)
MONOCYTES: 10 %
Monocytes Absolute: 0.6 10*3/uL (ref 0.1–0.9)
Neutrophils Absolute: 3.8 10*3/uL (ref 1.4–7.0)
Neutrophils: 60 %
PLATELETS: 221 10*3/uL (ref 150–379)
RBC: 3.85 x10E6/uL (ref 3.77–5.28)
RDW: 13.7 % (ref 12.3–15.4)
WBC: 6.4 10*3/uL (ref 3.4–10.8)

## 2016-08-21 LAB — COMPREHENSIVE METABOLIC PANEL
A/G RATIO: 2 (ref 1.2–2.2)
ALBUMIN: 4.5 g/dL (ref 3.6–4.8)
ALK PHOS: 100 IU/L (ref 39–117)
ALT: 20 IU/L (ref 0–32)
AST: 25 IU/L (ref 0–40)
BILIRUBIN TOTAL: 0.3 mg/dL (ref 0.0–1.2)
BUN / CREAT RATIO: 20 (ref 12–28)
BUN: 14 mg/dL (ref 8–27)
CHLORIDE: 101 mmol/L (ref 96–106)
CO2: 30 mmol/L — ABNORMAL HIGH (ref 18–29)
Calcium: 9.5 mg/dL (ref 8.7–10.3)
Creatinine, Ser: 0.71 mg/dL (ref 0.57–1.00)
GFR calc Af Amer: 101 mL/min/{1.73_m2} (ref >59)
GFR calc non Af Amer: 88 mL/min/{1.73_m2} (ref >59)
GLUCOSE: 94 mg/dL (ref 65–99)
Globulin, Total: 2.3 g/dL (ref 1.5–4.5)
POTASSIUM: 4.3 mmol/L (ref 3.5–5.2)
Sodium: 143 mmol/L (ref 134–144)
Total Protein: 6.8 g/dL (ref 6.0–8.5)

## 2016-08-22 ENCOUNTER — Telehealth: Payer: Self-pay

## 2016-08-22 NOTE — Telephone Encounter (Signed)
I called pt to discuss lab results. No answer, left a message asking her to call me back. 

## 2016-08-22 NOTE — Telephone Encounter (Signed)
-----   Message from Larey Seat, MD sent at 08/22/2016  5:35 PM EST ----- High MCV , MCH- low normal RBC. Please recommend to patient to take a multivitamin, such as a prenatal for B complex and some iron supplement.  Ferrous sulfate , OTC one a day 325 mg.

## 2016-08-23 NOTE — Telephone Encounter (Signed)
Spoke to patient - asked her to add a prenatal or iron to her regimen and she stated that she is taking already a B complex.  Requested labs to be forwarded to new PCP, [facility] Dr. Lamount Cranker.  Cyril Mourning , please send this new PCP the lab results. CD

## 2016-08-23 NOTE — Telephone Encounter (Signed)
Patient returning nurse's call in reference to lab results.  Please call

## 2016-08-26 NOTE — Telephone Encounter (Signed)
Labs routed to Dr. Fara Olden via [facility]; Dr. Fara Olden should receive them as a fax since she is not part of [facility].

## 2016-12-25 ENCOUNTER — Telehealth: Payer: Self-pay | Admitting: Nurse Practitioner

## 2016-12-25 MED ORDER — INTERFERON BETA-1A 44 MCG/0.5ML ~~LOC~~ SOSY: 44.0000 ug | PREFILLED_SYRINGE | SUBCUTANEOUS | 1 refills | Status: DC

## 2016-12-25 NOTE — Telephone Encounter (Signed)
Elora with [facility] called requesting refill for patients interferon beta-1a (REBIF) 44 MCG/0.5ML SOSY injection patient currently out of the medication.

## 2016-12-25 NOTE — Addendum Note (Signed)
Addended byOliver Hum on: 12/25/2016 11:57 AM   Modules accepted: Orders

## 2016-12-25 NOTE — Telephone Encounter (Signed)
Spoke to [place] and relayed that escript was sent at 1158 today, with confirmation.  I gave verbal for 90 days and 1 refill since she has not received it.  Rebif 30mcg/0.5ml sosy inj . 3 times weekly.

## 2017-02-04 ENCOUNTER — Telehealth: Payer: Self-pay | Admitting: Neurology

## 2017-02-04 NOTE — Telephone Encounter (Signed)
Patient called office in reference to patient seeing an article on a study for  people who have had MS for 10 years and taking people off the medications (older patients).  Patient would like to know if she can be a [place] for this study due to grant not covering patients Rebif due to no funds.  Please call

## 2017-02-04 NOTE — Telephone Encounter (Signed)
She has been getting patient assistance through [employer] (Patient [employer]) but she received a letter stating they have run out of funds.  I provided her the following numbers:  1) [place] [phone] 2) [facility] [phone]  I will also send this to Rance Muir to see if there are other patient assistance programs available.  She may be able to get help directly from the manufacturer.

## 2017-02-07 ENCOUNTER — Other Ambulatory Visit: Payer: Self-pay | Admitting: Nurse Practitioner

## 2017-02-07 DIAGNOSIS — G35 Multiple sclerosis: Secondary | ICD-10-CM

## 2017-02-07 DIAGNOSIS — G43D0 Abdominal migraine, not intractable: Secondary | ICD-10-CM

## 2017-02-18 NOTE — Telephone Encounter (Signed)
Called Rebif and per Rebif they are working on funding for her. Per Rebif if patient get's down to two injections she needs to call. Patient has been explained all details and she is aware.  We just need to wait at this point.

## 2017-03-20 ENCOUNTER — Other Ambulatory Visit: Payer: Self-pay | Admitting: Nurse Practitioner

## 2017-04-24 ENCOUNTER — Other Ambulatory Visit: Payer: Self-pay | Admitting: Internal Medicine

## 2017-04-24 DIAGNOSIS — Z1231 Encounter for screening mammogram for malignant neoplasm of breast: Secondary | ICD-10-CM

## 2017-05-12 ENCOUNTER — Ambulatory Visit
Admission: RE | Admit: 2017-05-12 | Discharge: 2017-05-12 | Disposition: A | Discharge status: Home / Self Care | Payer: Medicare Other | Source: Ambulatory Visit | Attending: Internal Medicine | Admitting: Internal Medicine

## 2017-05-12 DIAGNOSIS — Z1231 Encounter for screening mammogram for malignant neoplasm of breast: Secondary | ICD-10-CM

## 2017-08-20 NOTE — Progress Notes (Signed)
[facility]  PATIENT: [name] DOB: 05-17-1946   REASON FOR VISIT: Follow-up for multiple sclerosis, migraine HISTORY FROM: Patient    HISTORY OF PRESENT ILLNESS:[name], a 72 year old female , retired , married female ,returns for followup.She was last seen 07/03/15 and has been followed for multiple sclerosis since October 2008. She has had 2 migraines in the past month, worse during allergy season  She is on Imitrex with good results. She does not want to be on a preventative medication . She has associated photophobia, she used to have a visual aura of Zig zag lines and lights , now it is more of a tunnel vision arising from the periphery centrally with tiny little light dots.The headaches last with a pounding throbbing character about 3 hours up to a whole day . She is fatigued afterwards. She is highly sensitive to scents and smells.  She is currently on Rebif tolerating the medication without significant injection reactions. She does not pretreat. She denies spasms, focal weakness, sensory changes, visual changes, speech or swallowing problems, no problems with bowel or bladder function. She has not fallen, she does not use an assistive device.  Last MRI of the brain 07/02/13 read as abnormal - MRI scan of brain showing multilple periventricular and supratentorial white matter lesions consistent with multiple sclerosis. No enhancing lesions are noted. The presence of T 1 black holes indicates chronic disease. Overall mild increase in white matter lesion load compared to MRI 05/24/2007. She returns for reevaluation   08/20/16 [name], a 72 year old female , retired , married female ,returns for followup. She was  seen  In 2017  and has been followed for multiple sclerosis since October 2008.  She has had 2 migraines in the past  3 month, worse during allergy season,   She ison Imitrex with good results. She does not want to be on a  preventative medication . She has associated photophobia, she used to have a visual aura of Zig zag lines and lights , now it is more of a tunnel vision arising from the periphery centrally with tiny little light dots.The headaches last with a pounding throbbing character about 3 hours up to a whole da . She is fatigued afterwards. She is highly sensitive to scents and smells.  She is currently on Rebif tolerating the medication without significant injection reactions. She does not pretreat. She denies spasms, focal weakness, sensory changes, visual changes, speech or swallowing problems, no problems with bowel or bladder function. She has not fallen, she does not use an assistive device.  Last MRI of the brain 07/02/13 read as abnormal - MRI scan of brain showing multilple periventricular and supratentorial white matter lesions consistent with multiple sclerosis. No enhancing lesions are noted. The presence of T 1 black holes indicates chronic disease. Overall mild increase in white matter lesion load compared to MRI 05/24/2007. She returns for reevaluation. No MRI since 2014 and no clinical relapse signs.  UPDATE UPDATE 1/10/2019[name], 72 year old female returns for follow-up with a history of multiple sclerosis since October 2008.  She also has a history of migraines for which she uses Imitrex.  She has had more headaches recently due to the holidays however she is not interested in being on a preventive medication.  She awoke with a headache this morning took some Tylenol then later Imitrex with relief of her headache.  Her headaches have a pounding character and she can have a visual aura of zigzag lines  and lights.  She remains on rebif  for relapsing remitting multiple sclerosis.  She denies injection site issues.  She denies any weakness sensory changes visual changes bowel or bladder problems,.  She is fairly active.  Last MRI of 2014 and she has not had any clinical relapse signs.   She is not interested in having an MRI.  She returns for reevaluation REVIEW OF SYSTEMS: Full 14 system review of systems performed and notable only for those listed, all others are neg:  Constitutional: neg  Cardiovascular: neg Ear/Nose/Throat: neg  Skin: neg Eyes: neg Respiratory: neg Gastroitestinal: neg  Hematology/Lymphatic: neg  Endocrine: neg Musculoskeletal: Joint pain Allergy/Immunology: neg Neurological: History of headaches Psychiatric: neg Sleep : neg   ALLERGIES: Allergies  Allergen Reactions  . Penicillins Rash    As a child. Has patient had a PCN reaction causing immediate rash, facial/tongue/throat swelling, SOB or lightheadedness with hypotension: No Has patient had a PCN reaction causing severe rash involving mucus membranes or skin necrosis: No Has patient had a PCN reaction that required hospitalization: No Has patient had a PCN reaction occurring within the last 10 years: No If all of the above answers are "NO", then may proceed with Cephalosporin use.   . Codeine Nausea And Vomiting    HOME MEDICATIONS: Outpatient Medications Prior to Visit  Medication Sig Dispense Refill  . acetaminophen (TYLENOL) 160 MG chewable tablet Chew 160 mg by mouth every 6 (six) hours as needed for pain.    [name] latanoprost (XALATAN) 0.005 % ophthalmic solution Place 1 drop into both eyes at bedtime.  6  . levothyroxine (SYNTHROID, LEVOTHROID) 88 MCG tablet Take 88 mcg by mouth See admin instructions. Takes before breakfast Monday through Friday only.    . Multiple Vitamin (MULTIVITAMIN WITH MINERALS) TABS tablet Take 1 tablet by mouth daily.    [name] REBIF 44 MCG/0.5ML SOSY injection INJECT 44MCG SUBQ 3 TIMES A WEEK 36 Syringe 1  . SUMAtriptan (IMITREX) 100 MG tablet TAKE 1 TABLET BY MOUTH AS NEEDED FOR HEADACHE MAY REPEAT IN 2 HOURS IF HEADACHE PERSIST 10 tablet 3  . Vitamin D, Ergocalciferol, (DRISDOL) 50000 units CAPS capsule Take 50,000 Units by mouth See admin instructions.  Twice on month on the 15th and 30th.     No facility-administered medications prior to visit.     PAST MEDICAL HISTORY: Past Medical History:  Diagnosis Date  . Endometrial cancer ([place])   . Family history of adverse reaction to anesthesia    mother ponv  . Migraine   . Multiple sclerosis ([place])     PAST SURGICAL HISTORY: Past Surgical History:  Procedure Laterality Date  . cataracts Bilateral   . COLONOSCOPY WITH PROPOFOL N/A 12/11/2015   Procedure: COLONOSCOPY WITH PROPOFOL;  Surgeon: Garlan Fair, MD;  Location: [facility];  Service: Endoscopy;  Laterality: N/A;  . salzman nodules  12/2014   Had one eye then the other on 01/2015  . TONSILLECTOMY    . VAGINAL HYSTERECTOMY     complete    FAMILY HISTORY: Family History  Problem Relation Age of Onset  . Stroke Mother   . Congestive Heart Failure Mother   . Migraines Sister     SOCIAL HISTORY: Social History   Socioeconomic History  . Marital status: Married    Spouse name: [name]  . Number of children: 1  . Years of education: 87  . Highest education level: Not on file  Social Needs  . Financial resource strain: Not on file  .  Food insecurity - worry: Not on file  . Food insecurity - inability: Not on file  . Transportation needs - medical: Not on file  . Transportation needs - non-medical: Not on file  Occupational History    Employer: [employer]  Tobacco Use  . Smoking status: Never Smoker  . Smokeless tobacco: Never Used  Substance and Sexual Activity  . Alcohol use: Yes    Alcohol/week: 0.6 oz    Types: 1 Glasses of wine per week    Comment: wine on occ  . Drug use: No  . Sexual activity: Not on file  Other Topics Concern  . 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   Patient has one child.   Patient is retired.   Patient has a college education.   Patient is right-handed.   Patient drinks very little caffeine.     PHYSICAL EXAM  Vitals:     08/21/17 1105  BP: 106/63  Pulse: 60  Weight: 111 lb 12.8 oz (50.7 kg)   Body mass index is 19.19 kg/m. Generalized: Well developed, in no acute distress  Head: normocephalic and atraumatic,. Oropharynx benign  Neck: Supple, no carotid bruits  Musculoskeletal: No deformity   Neurological examination   Mentation: Alert oriented to time, place, history taking. Attention span and concentration appropriate. Recent and remote memory intact. Follows all commands speech and language fluent.   Cranial nerve II-XII: Fundoscopic exam reveals sharp disc margins.Pupils were equal round reactive to light extraocular movements were full, visual field were full on confrontational test. Facial sensation and strength were normal. hearing was intact to finger rubbing bilaterally. Uvula tongue midline. head turning and shoulder shrug were normal and symmetric.Tongue protrusion into cheek strength was normal. Motor: normal bulk and tone, full strength in the BUE, BLE, fine finger movements normal, no pronator drift. No focal weakness Sensory: normal and symmetric to light touch, pinprick, and Vibration in the upper and lower extremities,  Coordination: finger-nose-finger, heel-to-shin bilaterally, no dysmetria, no tremor Reflexes: Brachioradialis 2/2, biceps 2/2, triceps 2/2, patellar 2/2, Achilles 2/2, plantar responses were flexor bilaterally. Gait and Station: Rising up from seated position without assistance, normal stance, moderate stride, good arm swing, smooth turning, able to perform tiptoe, and heel walking without difficulty. Tandem gait is steady.  No assistive device DIAGNOSTIC DATA (LABS, IMAGING, TESTING) - ASSESSMENT AND PLAN 72 y.o. year old female has a past medical history of Migraine;; and Multiple sclerosis ([place]). here to follow-up. She has not had exacerbation of MS symptoms since last seen. Imitrex is used acutely for migraines.  Patient does not wish to be on a preventive  medication  PLAN: Continue Rebif 3 times a week, will get labs to monitor for adverse effects will refill Continue sumatriptan when necessary acute migraine will refill Exercise for overall health, healthy diet Follow-up in 6 months  Call for any exacerbation of MS symptoms Last MRI 2014, stable, patient does not wish to have another MRI at this time [name], [facility], [facility], [facility] [address], [place], [place] [address] [phone]

## 2017-08-21 ENCOUNTER — Encounter: Payer: Self-pay | Admitting: Nurse Practitioner

## 2017-08-21 ENCOUNTER — Ambulatory Visit: Payer: Medicare Other | Admitting: Nurse Practitioner

## 2017-08-21 VITALS — BP 106/63 | HR 60 | Wt 111.8 lb

## 2017-08-21 DIAGNOSIS — G43909 Migraine, unspecified, not intractable, without status migrainosus: Secondary | ICD-10-CM

## 2017-08-21 DIAGNOSIS — Z5181 Encounter for therapeutic drug level monitoring: Secondary | ICD-10-CM | POA: Diagnosis not present

## 2017-08-21 DIAGNOSIS — G35 Multiple sclerosis: Secondary | ICD-10-CM

## 2017-08-21 MED ORDER — INTERFERON BETA-1A 44 MCG/0.5ML ~~LOC~~ SOSY: PREFILLED_SYRINGE | SUBCUTANEOUS | 1 refills | Status: DC

## 2017-08-21 MED ORDER — SUMATRIPTAN SUCCINATE 100 MG PO TABS: ORAL_TABLET | ORAL | 3 refills | Status: DC

## 2017-08-21 NOTE — Patient Instructions (Signed)
Continue Rebif 3 times a week, will get labs will refill Continue sumatriptan when necessary acute migraine will refill Exercise for overall health, healthy diet Follow-up in 6 months

## 2017-08-22 ENCOUNTER — Telehealth: Payer: Self-pay | Admitting: *Deleted

## 2017-08-22 LAB — CBC WITH DIFFERENTIAL/PLATELET
BASOS: 0 %
Basophils Absolute: 0 10*3/uL (ref 0.0–0.2)
EOS (ABSOLUTE): 0.2 10*3/uL (ref 0.0–0.4)
EOS: 4 %
HEMATOCRIT: 41.6 % (ref 34.0–46.6)
HEMOGLOBIN: 14.1 g/dL (ref 11.1–15.9)
IMMATURE GRANS (ABS): 0 10*3/uL (ref 0.0–0.1)
Immature Granulocytes: 0 %
LYMPHS ABS: 1.6 10*3/uL (ref 0.7–3.1)
LYMPHS: 30 %
MCH: 33.7 pg — AB (ref 26.6–33.0)
MCHC: 33.9 g/dL (ref 31.5–35.7)
MCV: 99 fL — ABNORMAL HIGH (ref 79–97)
MONOCYTES: 11 %
Monocytes Absolute: 0.6 10*3/uL (ref 0.1–0.9)
NEUTROS ABS: 2.9 10*3/uL (ref 1.4–7.0)
Neutrophils: 55 %
Platelets: 226 10*3/uL (ref 150–379)
RBC: 4.19 x10E6/uL (ref 3.77–5.28)
RDW: 14 % (ref 12.3–15.4)
WBC: 5.3 10*3/uL (ref 3.4–10.8)

## 2017-08-22 LAB — COMPREHENSIVE METABOLIC PANEL
A/G RATIO: 2.2 (ref 1.2–2.2)
ALBUMIN: 4.9 g/dL — AB (ref 3.6–4.8)
ALT: 16 IU/L (ref 0–32)
AST: 27 IU/L (ref 0–40)
Alkaline Phosphatase: 77 IU/L (ref 39–117)
BILIRUBIN TOTAL: 0.6 mg/dL (ref 0.0–1.2)
BUN / CREAT RATIO: 19 (ref 12–28)
BUN: 18 mg/dL (ref 8–27)
CO2: 22 mmol/L (ref 20–29)
Calcium: 10.1 mg/dL (ref 8.7–10.3)
Chloride: 103 mmol/L (ref 96–106)
Creatinine, Ser: 0.93 mg/dL (ref 0.57–1.00)
GFR calc non Af Amer: 63 mL/min/{1.73_m2} (ref >59)
GFR, EST AFRICAN AMERICAN: 73 mL/min/{1.73_m2} (ref >59)
GLOBULIN, TOTAL: 2.2 g/dL (ref 1.5–4.5)
Glucose: 95 mg/dL (ref 65–99)
POTASSIUM: 4.5 mmol/L (ref 3.5–5.2)
SODIUM: 143 mmol/L (ref 134–144)
TOTAL PROTEIN: 7.1 g/dL (ref 6.0–8.5)

## 2017-08-22 NOTE — Telephone Encounter (Signed)
Spoke with patient and informed her that her labs are stable.  She verbalized understanding, appreciation.  

## 2017-08-22 NOTE — Progress Notes (Signed)
I agree with the assessment and plan as directed by NP .The patient is known to me .   Lorely Bubb, MD  

## 2018-01-27 ENCOUNTER — Telehealth: Payer: Self-pay | Admitting: *Deleted

## 2018-01-27 NOTE — Telephone Encounter (Signed)
Spoke to pt and she is taking the BN REBIF thru [place] pt assistance and receives REBIF thru [facility]. I spoke to [place] - pharmacist and relayed that pt to taking BN REBIF. (clarification was needed as provider signed DAW and substitution allowed).  They do not have generic for this drug.  They will fill as BN drug.

## 2018-01-27 NOTE — Telephone Encounter (Signed)
LMVM for pt ot return call re: rebif medication.

## 2018-01-28 NOTE — Telephone Encounter (Signed)
[facility] faxed receiving prescription and service request form for pt.  Will contact us fir further information or clarification is needed.

## 2018-02-17 NOTE — Progress Notes (Addendum)
[facility]  PATIENT: [name] DOB: 05-17-1946   REASON FOR VISIT: Follow-up for multiple sclerosis, migraine HISTORY FROM: Patient    HISTORY OF PRESENT ILLNESS:[name], a 72 year old female , retired , married female ,returns for followup.She was last seen 07/03/15 and has been followed for multiple sclerosis since October 2008. She has had 2 migraines in the past month, worse during allergy season  She is on Imitrex with good results. She does not want to be on a preventative medication . She has associated photophobia, she used to have a visual aura of Zig zag lines and lights , now it is more of a tunnel vision arising from the periphery centrally with tiny little light dots.The headaches last with a pounding throbbing character about 3 hours up to a whole day . She is fatigued afterwards. She is highly sensitive to scents and smells.  She is currently on Rebif tolerating the medication without significant injection reactions. She does not pretreat. She denies spasms, focal weakness, sensory changes, visual changes, speech or swallowing problems, no problems with bowel or bladder function. She has not fallen, she does not use an assistive device.  Last MRI of the brain 07/02/13 read as abnormal - MRI scan of brain showing multilple periventricular and supratentorial white matter lesions consistent with multiple sclerosis. No enhancing lesions are noted. The presence of T 1 black holes indicates chronic disease. Overall mild increase in white matter lesion load compared to MRI 05/24/2007. She returns for reevaluation   08/20/16 [name], a 72 year old female , retired , married female ,returns for followup. She was  seen  In 2017  and has been followed for multiple sclerosis since October 2008.  She has had 2 migraines in the past  3 month, worse during allergy season,   She ison Imitrex with good results. She does not want to be on a  preventative medication . She has associated photophobia, she used to have a visual aura of Zig zag lines and lights , now it is more of a tunnel vision arising from the periphery centrally with tiny little light dots.The headaches last with a pounding throbbing character about 3 hours up to a whole da . She is fatigued afterwards. She is highly sensitive to scents and smells.  She is currently on Rebif tolerating the medication without significant injection reactions. She does not pretreat. She denies spasms, focal weakness, sensory changes, visual changes, speech or swallowing problems, no problems with bowel or bladder function. She has not fallen, she does not use an assistive device.  Last MRI of the brain 07/02/13 read as abnormal - MRI scan of brain showing multilple periventricular and supratentorial white matter lesions consistent with multiple sclerosis. No enhancing lesions are noted. The presence of T 1 black holes indicates chronic disease. Overall mild increase in white matter lesion load compared to MRI 05/24/2007. She returns for reevaluation. No MRI since 2014 and no clinical relapse signs.  UPDATE UPDATE 1/10/2019[name], 72 year old female returns for follow-up with a history of multiple sclerosis since October 2008.  She also has a history of migraines for which she uses Imitrex.  She has had more headaches recently due to the holidays however she is not interested in being on a preventive medication.  She awoke with a headache this morning took some Tylenol then later Imitrex with relief of her headache.  Her headaches have a pounding character and she can have a visual aura of zigzag lines  and lights.  She remains on rebif  for relapsing remitting multiple sclerosis.  She denies injection site issues.  She denies any weakness sensory changes visual changes bowel or bladder problems,.  She is fairly active.  Last MRI of 2014 and she has not had any clinical relapse signs.   She is not interested in having an MRI.  She returns for reevaluation UPDATE 7/11/2019[name], 72 year old female returns for follow-up with history of multiple sclerosis since 2008.  She also has a history of migraines for which she uses Imitrex.  Her headaches are too infrequent to be on a preventive medication.  She is also on rebif relapsing remitting multiple sclerosis without injection site issues.  She denies any new visual changes sensory changes bowel or bladder problems weakness she remains fairly active.  Last MRI of the brain in 2014 and she has not had any clinical relapse.  She has no interest in having another MRI.  She returns for reevaluation REVIEW OF SYSTEMS: Full 14 system review of systems performed and notable only for those listed, all others are neg:  Constitutional: neg  Cardiovascular: neg Ear/Nose/Throat: neg  Skin: neg Eyes: neg Respiratory: neg Gastroitestinal: neg  Hematology/Lymphatic: neg  Endocrine: neg Musculoskeletal: Joint pain Allergy/Immunology: neg Neurological: History of headaches Psychiatric: neg Sleep : neg   ALLERGIES: Allergies  Allergen Reactions  . Penicillins Rash    As a child. Has patient had a PCN reaction causing immediate rash, facial/tongue/throat swelling, SOB or lightheadedness with hypotension: No Has patient had a PCN reaction causing severe rash involving mucus membranes or skin necrosis: No Has patient had a PCN reaction that required hospitalization: No Has patient had a PCN reaction occurring within the last 10 years: No If all of the above answers are "NO", then may proceed with Cephalosporin use.   . Codeine Nausea And Vomiting    HOME MEDICATIONS: Outpatient Medications Prior to Visit  Medication Sig Dispense Refill  . acetaminophen (TYLENOL) 160 MG chewable tablet Chew 160 mg by mouth every 6 (six) hours as needed for pain.    [name] interferon beta-1a (REBIF) 44 MCG/0.5ML SOSY injection INJECT 44MCG SUBQ 3  TIMES A WEEK 36 Syringe 1  . latanoprost (XALATAN) 0.005 % ophthalmic solution Place 1 drop into both eyes at bedtime.  6  . levothyroxine (SYNTHROID, LEVOTHROID) 88 MCG tablet Take 88 mcg by mouth See admin instructions. Takes before breakfast Monday through Friday only.    . Multiple Vitamin (MULTIVITAMIN WITH MINERALS) TABS tablet Take 1 tablet by mouth daily.    . SUMAtriptan (IMITREX) 100 MG tablet TAKE 1 TABLET BY MOUTH AS NEEDED FOR HEADACHE MAY REPEAT IN 2 HOURS IF HEADACHE PERSIST 10 tablet 3  . Vitamin D, Ergocalciferol, (DRISDOL) 50000 units CAPS capsule Take 50,000 Units by mouth See admin instructions. Twice on month on the 15th and 30th.     No facility-administered medications prior to visit.     PAST MEDICAL HISTORY: Past Medical History:  Diagnosis Date  . Endometrial cancer ([place])   . Family history of adverse reaction to anesthesia    mother ponv  . Migraine   . Multiple sclerosis ([place])     PAST SURGICAL HISTORY: Past Surgical History:  Procedure Laterality Date  . cataracts Bilateral   . COLONOSCOPY WITH PROPOFOL N/A 12/11/2015   Procedure: COLONOSCOPY WITH PROPOFOL;  Surgeon: Garlan Fair, MD;  Location: [facility];  Service: Endoscopy;  Laterality: N/A;  . salzman nodules  12/2014   Had  one eye then the other on 01/2015  . TONSILLECTOMY    . VAGINAL HYSTERECTOMY     complete    FAMILY HISTORY: Family History  Problem Relation Age of Onset  . Stroke Mother   . Congestive Heart Failure Mother   . Migraines Sister     SOCIAL HISTORY: Social History   Socioeconomic History  . Marital status: Married    Spouse name: [name]  . Number of children: 1  . Years of education: 32  . Highest education level: Not on file  Occupational History    Employer: [employer]  . Financial resource strain: Not on file  . Food insecurity:    Worry: Not on file    Inability: Not on file  . Transportation needs:    Medical: Not on file    Non-medical:  Not on file  Tobacco Use  . Smoking status: Never Smoker  . Smokeless tobacco: Never Used  Substance and Sexual Activity  . Alcohol use: Yes    Alcohol/week: 0.6 oz    Types: 1 Glasses of wine per week    Comment: wine on occ  . Drug use: No  . Sexual activity: Not on file  Lifestyle  . Physical activity:    Days per week: Not on file    Minutes per session: Not on file  . Stress: Not on file  Relationships  . Social connections:    Talks on phone: Not on file    Gets together: Not on file    Attends religious service: Not on file    Active member of club or organization: Not on file    Attends meetings of clubs or organizations: Not on file    Relationship status: Not on file  . Intimate partner violence:    Fear of current or ex partner: Not on file    Emotionally abused: Not on file    Physically abused: Not on file    Forced sexual activity: Not on file  Other Topics Concern  . 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   Patient has one child.   Patient is retired.   Patient has a college education.   Patient is right-handed.   Patient drinks very little caffeine.     PHYSICAL EXAM  Vitals:   02/19/18 1021  BP: (!) 93/50  Pulse: (!) 59  Weight: 110 lb (49.9 kg)  Height: 5\' 4"  (1.626 m)   Body mass index is 18.88 kg/m. Generalized: Well developed, in no acute distress  Head: normocephalic and atraumatic,. Oropharynx benign  Neck: Supple, no carotid bruits  Musculoskeletal: No deformity   Neurological examination   Mentation: Alert oriented to time, place, history taking. Attention span and concentration appropriate. Recent and remote memory intact. Follows all commands speech and language fluent.   Cranial nerve II-XII: Fundoscopic exam reveals sharp disc margins.Pupils were equal round reactive to light extraocular movements were full, visual field were full on confrontational test. Facial  sensation and strength were normal. hearing was intact to finger rubbing bilaterally. Uvula tongue midline. head turning and shoulder shrug were normal and symmetric.Tongue protrusion into cheek strength was normal. Motor: normal bulk and tone, full strength in the BUE, BLE, fine finger movements normal, no pronator drift. No focal weakness Sensory: normal and symmetric to light touch, pinprick, and Vibration in the upper and lower extremities,  Coordination: finger-nose-finger, heel-to-shin bilaterally, no dysmetria, no tremor  Reflexes: Brachioradialis 2/2, biceps 2/2, triceps 2/2, patellar 2/2, Achilles 2/2, plantar responses were flexor bilaterally. Gait and Station: Rising up from seated position without assistance, normal stance, moderate stride, good arm swing, smooth turning, able to perform tiptoe, and heel walking without difficulty. Tandem gait is steady.  No assistive device DIAGNOSTIC DATA (LABS, IMAGING, TESTING) - ASSESSMENT AND PLAN 72 y.o. year old female has a past medical history of Migraine;; and Multiple sclerosis ([place]). here to follow-up. She has not had exacerbation of MS symptoms since last seen. Imitrex is used acutely for migraines.  Patient does not wish to be on a preventive medication. The patient is a current patient of Dr. Brett Fairy who is out of the office today . This note is sent to the work in doctor.     PLAN: Continue Rebif 3 times a week, reviewed labs  will refill Continue sumatriptan when necessary acute migraine will refill Exercise for overall health, healthy diet Follow-up in 6 months  Call for any exacerbation of MS symptoms Last MRI 2014, stable, patient does not wish to have another MRI at this time [name], [facility], [facility], [facility] [address], [place], [place] [address] [phone]  I reviewed the above note and documentation by the Nurse Practitioner and agree with the history, physical exam,  assessment and plan as outlined above. I was immediately available for face-to-face consultation. Mandy Age, MD, PhD [facility])

## 2018-02-19 ENCOUNTER — Ambulatory Visit: Payer: Medicare Other | Admitting: Nurse Practitioner

## 2018-02-19 ENCOUNTER — Encounter: Payer: Self-pay | Admitting: Nurse Practitioner

## 2018-02-19 VITALS — BP 93/50 | HR 59 | Ht 64.0 in | Wt 110.0 lb

## 2018-02-19 DIAGNOSIS — R51 Headache: Secondary | ICD-10-CM | POA: Diagnosis not present

## 2018-02-19 DIAGNOSIS — G35 Multiple sclerosis: Secondary | ICD-10-CM | POA: Diagnosis not present

## 2018-02-19 DIAGNOSIS — R519 Headache, unspecified: Secondary | ICD-10-CM

## 2018-02-19 MED ORDER — SUMATRIPTAN SUCCINATE 100 MG PO TABS: ORAL_TABLET | ORAL | 3 refills | Status: DC

## 2018-02-19 MED ORDER — INTERFERON BETA-1A 44 MCG/0.5ML ~~LOC~~ SOSY: PREFILLED_SYRINGE | SUBCUTANEOUS | 1 refills | Status: DC

## 2018-02-19 NOTE — Patient Instructions (Signed)
Continue Rebif 3 times a week, reviewed labs  will refill Continue sumatriptan when necessary acute migraine will refill Exercise for overall health, healthy diet Follow-up in 6 months  Call for any exacerbation of MS symptoms Last MRI 2014, stable, patient does not wish to have another MRI at this time

## 2018-04-17 ENCOUNTER — Other Ambulatory Visit: Payer: Self-pay | Admitting: Internal Medicine

## 2018-04-17 DIAGNOSIS — Z1231 Encounter for screening mammogram for malignant neoplasm of breast: Secondary | ICD-10-CM

## 2018-05-13 ENCOUNTER — Ambulatory Visit
Admission: RE | Admit: 2018-05-13 | Discharge: 2018-05-13 | Disposition: A | Discharge status: Home / Self Care | Payer: Medicare Other | Source: Ambulatory Visit | Attending: Internal Medicine | Admitting: Internal Medicine

## 2018-05-13 DIAGNOSIS — Z1231 Encounter for screening mammogram for malignant neoplasm of breast: Secondary | ICD-10-CM

## 2018-06-09 ENCOUNTER — Other Ambulatory Visit: Payer: Self-pay | Admitting: Nurse Practitioner

## 2018-08-19 ENCOUNTER — Telehealth: Payer: Self-pay | Admitting: *Deleted

## 2018-08-19 NOTE — Telephone Encounter (Signed)
Called [facility] PA dept re: fax received from [facility] stating Rebif exceeds plan limits. Spoke with Andris Flurry who stated no PA required from her end.  Called [facility] pharmacy, spoke with [facility] who stated the patient last got a 3 month refill (36 syringes) on 07/31/18. She stated if the medication needs a PA it will be sent to this office by the pharmacy when she refills Rebif the next time. At this time a PA is not needed. This RN thanked her.

## 2018-08-28 ENCOUNTER — Other Ambulatory Visit: Payer: Self-pay | Admitting: Internal Medicine

## 2018-08-28 DIAGNOSIS — M85859 Other specified disorders of bone density and structure, unspecified thigh: Secondary | ICD-10-CM

## 2018-08-31 NOTE — Progress Notes (Signed)
[facility]  PATIENT: [name] DOB: 05-17-1946   REASON FOR VISIT: Follow-up for multiple sclerosis, migraine HISTORY FROM: Patient    HISTORY OF PRESENT ILLNESS:[name], a 73 year old female , retired , married female ,returns for followup.She was last seen 07/03/15 and has been followed for multiple sclerosis since October 2008. She has had 2 migraines in the past month, worse during allergy season  She is on Imitrex with good results. She does not want to be on a preventative medication . She has associated photophobia, she used to have a visual aura of Zig zag lines and lights , now it is more of a tunnel vision arising from the periphery centrally with tiny little light dots.The headaches last with a pounding throbbing character about 3 hours up to a whole day . She is fatigued afterwards. She is highly sensitive to scents and smells.  She is currently on Rebif tolerating the medication without significant injection reactions. She does not pretreat. She denies spasms, focal weakness, sensory changes, visual changes, speech or swallowing problems, no problems with bowel or bladder function. She has not fallen, she does not use an assistive device.  Last MRI of the brain 07/02/13 read as abnormal - MRI scan of brain showing multilple periventricular and supratentorial white matter lesions consistent with multiple sclerosis. No enhancing lesions are noted. The presence of T 1 black holes indicates chronic disease. Overall mild increase in white matter lesion load compared to MRI 05/24/2007. She returns for reevaluation   08/20/16 CD [name], a 73 year old female , retired , married female ,returns for followup. She was  seen  In 2017  and has been followed for multiple sclerosis since October 2008.  She has had 2 migraines in the past  3 month, worse during allergy season,   She ison Imitrex with good results. She does not want to be on a  preventative medication . She has associated photophobia, she used to have a visual aura of Zig zag lines and lights , now it is more of a tunnel vision arising from the periphery centrally with tiny little light dots.The headaches last with a pounding throbbing character about 3 hours up to a whole da . She is fatigued afterwards. She is highly sensitive to scents and smells.  She is currently on Rebif tolerating the medication without significant injection reactions. She does not pretreat. She denies spasms, focal weakness, sensory changes, visual changes, speech or swallowing problems, no problems with bowel or bladder function. She has not fallen, she does not use an assistive device.  Last MRI of the brain 07/02/13 read as abnormal - MRI scan of brain showing multilple periventricular and supratentorial white matter lesions consistent with multiple sclerosis. No enhancing lesions are noted. The presence of T 1 black holes indicates chronic disease. Overall mild increase in white matter lesion load compared to MRI 05/24/2007. She returns for reevaluation. No MRI since 2014 and no clinical relapse signs.  UPDATE UPDATE 1/10/2019CM [name], 73 year old female returns for follow-up with a history of multiple sclerosis since October 2008.  She also has a history of migraines for which she uses Imitrex.  She has had more headaches recently due to the holidays however she is not interested in being on a preventive medication.  She awoke with a headache this morning took some Tylenol then later Imitrex with relief of her headache.  Her headaches have a pounding character and she can have a visual aura of zigzag lines  and lights.  She remains on rebif  for relapsing remitting multiple sclerosis.  She denies injection site issues.  She denies any weakness sensory changes visual changes bowel or bladder problems,.  She is fairly active.  Last MRI of 2014 and she has not had any clinical relapse signs.   She is not interested in having an MRI.  She returns for reevaluation UPDATE 7/11/2019CM Ms. Kneece, 73 year old female returns for follow-up with history of multiple sclerosis since 2008.  She also has a history of migraines for which she uses Imitrex.  Her headaches are too infrequent to be on a preventive medication.  She is also on rebif relapsing remitting multiple sclerosis without injection site issues.  She denies any new visual changes sensory changes bowel or bladder problems weakness she remains fairly active.  Last MRI of the brain in 2014 and she has not had any clinical relapse.  She has no interest in having another MRI.  She returns for reevaluation UPDATE 1/21/2020CM .  Ms. Bumgarner, 73 year old female returns for follow-up with history of multiple sclerosis relapsing remitting since 2008.  She also has a history of migraines.  Her headaches are too infrequent to be on a preventive medication.  Her headaches are relieved with Imitrex.  She is on rebif for her multiple sclerosis without injection site reactions.  She has not had any exacerbation of symptoms.  Last MRI of the brain in 2014.  She denies any problems with bowel or bladder control, no new sensory changes, no falls, no visual disturbance no weakness no speech or swallowing difficulty.  She returns for reevaluation REVIEW OF SYSTEMS: Full 14 system review of systems performed and notable only for those listed, all others are neg:  Constitutional: neg  Cardiovascular: neg Ear/Nose/Throat: neg  Skin: neg Eyes: neg Respiratory: Cough Gastroitestinal: neg  Hematology/Lymphatic: neg  Endocrine: neg Musculoskeletal: neg Allergy/Immunology: neg Neurological: History of headaches Psychiatric: neg Sleep : neg   ALLERGIES: Allergies  Allergen Reactions  . Penicillins Rash    As a child. Has patient had a PCN reaction causing immediate rash, facial/tongue/throat swelling, SOB or lightheadedness with hypotension: No Has  patient had a PCN reaction causing severe rash involving mucus membranes or skin necrosis: No Has patient had a PCN reaction that required hospitalization: No Has patient had a PCN reaction occurring within the last 10 years: No If all of the above answers are "NO", then may proceed with Cephalosporin use.   . Codeine Nausea And Vomiting    HOME MEDICATIONS: Outpatient Medications Prior to Visit  Medication Sig Dispense Refill  . acetaminophen (TYLENOL) 160 MG chewable tablet Chew 160 mg by mouth every 6 (six) hours as needed for pain.    [name] latanoprost (XALATAN) 0.005 % ophthalmic solution Place 1 drop into both eyes at bedtime.  6  . levothyroxine (SYNTHROID, LEVOTHROID) 88 MCG tablet Take 88 mcg by mouth See admin instructions. Takes before breakfast Monday through Friday only.    . Multiple Vitamin (MULTIVITAMIN WITH MINERALS) TABS tablet Take 1 tablet by mouth daily.    [name] REBIF 44 MCG/0.5ML SOSY injection Inject  44 mcg Sub-q 3 times weekly Rotate site after each injection 36 Syringe 10  . SUMAtriptan (IMITREX) 100 MG tablet TAKE 1 TABLET BY MOUTH AS NEEDED FOR HEADACHE MAY REPEAT IN 2 HOURS IF HEADACHE PERSIST 10 tablet 3   No facility-administered medications prior to visit.     PAST MEDICAL HISTORY: Past Medical History:  Diagnosis Date  . Endometrial  cancer ([place])   . Family history of adverse reaction to anesthesia    mother ponv  . Migraine   . Multiple sclerosis ([place])     PAST SURGICAL HISTORY: Past Surgical History:  Procedure Laterality Date  . cataracts Bilateral   . COLONOSCOPY WITH PROPOFOL N/A 12/11/2015   Procedure: COLONOSCOPY WITH PROPOFOL;  Surgeon: Garlan Fair, MD;  Location: [facility];  Service: Endoscopy;  Laterality: N/A;  . salzman nodules  12/2014   Had one eye then the other on 01/2015  . TONSILLECTOMY    . VAGINAL HYSTERECTOMY     complete    FAMILY HISTORY: Family History  Problem Relation Age of Onset  . Stroke Mother   . Congestive  Heart Failure Mother   . Migraines Sister     SOCIAL HISTORY: Social History   Socioeconomic History  . Marital status: Married    Spouse name: [name]  . Number of children: 1  . Years of education: 64  . Highest education level: Not on file  Occupational History    Employer: [employer]  . Financial resource strain: Not on file  . Food insecurity:    Worry: Not on file    Inability: Not on file  . Transportation needs:    Medical: Not on file    Non-medical: Not on file  Tobacco Use  . Smoking status: Never Smoker  . Smokeless tobacco: Never Used  Substance and Sexual Activity  . Alcohol use: Yes    Alcohol/week: 1.0 standard drinks    Types: 1 Glasses of wine per week    Comment: wine on occ  . Drug use: No  . Sexual activity: Not on file  Lifestyle  . Physical activity:    Days per week: Not on file    Minutes per session: Not on file  . Stress: Not on file  Relationships  . Social connections:    Talks on phone: Not on file    Gets together: Not on file    Attends religious service: Not on file    Active member of club or organization: Not on file    Attends meetings of clubs or organizations: Not on file    Relationship status: Not on file  . Intimate partner violence:    Fear of current or ex partner: Not on file    Emotionally abused: Not on file    Physically abused: Not on file    Forced sexual activity: Not on file  Other Topics Concern  . 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   Patient has one child.   Patient is retired.   Patient has a college education.   Patient is right-handed.   Patient drinks very little caffeine.     PHYSICAL EXAM  Vitals:   09/01/18 1024  BP: (!) 104/54  Pulse: 62  Weight: 109 lb 9.6 oz (49.7 kg)  Height: 5\' 4"  (1.626 m)   Body mass index is 18.81 kg/m. Generalized: Well developed, in no acute distress  Head: normocephalic and atraumatic,.  Oropharynx benign  Neck: Supple,  Musculoskeletal: No deformity   Neurological examination   Mentation: Alert oriented to time, place, history taking. Attention span and concentration appropriate. Recent and remote memory intact. Follows all commands speech and language fluent.   Cranial nerve II-XII: Fundoscopic exam reveals sharp disc margins.Pupils were equal round reactive to light extraocular movements were full, visual field  were full on confrontational test. Facial sensation and strength were normal. hearing was intact to finger rubbing bilaterally. Uvula tongue midline. head turning and shoulder shrug were normal and symmetric.Tongue protrusion into cheek strength was normal. Motor: normal bulk and tone, full strength in the BUE, BLE,  Sensory: normal and symmetric to light touch, pinprick, and Vibration in the upper and lower extremities,  Coordination: finger-nose-finger, heel-to-shin bilaterally, no dysmetria, no tremor Reflexes: Symmetric upper and lower, plantar responses were flexor bilaterally. Gait and Station: Rising up from seated position without assistance, normal stance, moderate stride, good arm swing, smooth turning, able to perform tiptoe, and heel walking without difficulty. Tandem gait is steady.  No assistive device DIAGNOSTIC DATA (LABS, IMAGING, TESTING) - ASSESSMENT AND PLAN 73y.o. year old female has a past medical history of Migraine;; and Multiple sclerosis ([place]). here to follow-up. She has not had exacerbation of MS symptoms since last seen. Imitrex is used acutely for migraines.  Patient does not wish to be on a preventive medication.  Last 2014 we will repeat     PLAN: Continue Rebif 3 times a week,   will refill Will sign to get recent labs from PCP Continue sumatriptan when necessary acute migraine  Exercise for overall health, healthy diet Follow-up in 6 months  Call for any exacerbation of MS symptoms Last MRI 2014, stable, Will repeat to follow  progression of the disease [name], [facility], [facility], [facility] [address] [place], [place] [address] [phone]

## 2018-09-01 ENCOUNTER — Ambulatory Visit: Payer: Medicare Other | Admitting: Nurse Practitioner

## 2018-09-01 ENCOUNTER — Telehealth: Payer: Self-pay | Admitting: Nurse Practitioner

## 2018-09-01 ENCOUNTER — Encounter: Payer: Self-pay | Admitting: Nurse Practitioner

## 2018-09-01 VITALS — BP 104/54 | HR 62 | Ht 64.0 in | Wt 109.6 lb

## 2018-09-01 DIAGNOSIS — G35 Multiple sclerosis: Secondary | ICD-10-CM | POA: Diagnosis not present

## 2018-09-01 DIAGNOSIS — G43909 Migraine, unspecified, not intractable, without status migrainosus: Secondary | ICD-10-CM

## 2018-09-01 MED ORDER — SUMATRIPTAN SUCCINATE 100 MG PO TABS: ORAL_TABLET | ORAL | 3 refills | Status: DC

## 2018-09-01 MED ORDER — INTERFERON BETA-1A 44 MCG/0.5ML ~~LOC~~ SOSY: PREFILLED_SYRINGE | SUBCUTANEOUS | 6 refills | Status: DC

## 2018-09-01 NOTE — Patient Instructions (Signed)
Continue Rebif 3 times a week,   will refill Will sign to get recent labs from PCP Continue sumatriptan when necessary acute migraine  Exercise for overall health, healthy diet Follow-up in 6 months  Call for any exacerbation of MS symptoms Last MRI 2014, stable, Will repeat to follow progression of the disease

## 2018-09-01 NOTE — Telephone Encounter (Signed)
[employer] medicare order sent to GI pt is aware.

## 2018-09-02 ENCOUNTER — Telehealth: Payer: Self-pay | Admitting: *Deleted

## 2018-09-02 NOTE — Telephone Encounter (Signed)
Received fax from [facility] asking to refill Rebif x 3 months as patient has previously refilled x 3 months at a time. Spoke with NP who saw patient, and she stated patient had letter from her insurance stating they'll only pay for 1 month at a time. That is why NP put in refill x 1 month. I called pharmacy, spoke with Milta Deiters and advised him of reason for Rx x 1 month. E stated he would make a note, verbalized understanding.

## 2018-09-14 ENCOUNTER — Ambulatory Visit
Admission: RE | Admit: 2018-09-14 | Discharge: 2018-09-14 | Disposition: A | Discharge status: Home / Self Care | Payer: Medicare Other | Source: Ambulatory Visit | Attending: Nurse Practitioner | Admitting: Nurse Practitioner

## 2018-09-14 DIAGNOSIS — G35 Multiple sclerosis: Secondary | ICD-10-CM

## 2018-09-14 MED ORDER — GADOBENATE DIMEGLUMINE 529 MG/ML IV SOLN
10.0000 mL | Freq: Once | INTRAVENOUS | Status: AC | PRN
  Administered 2018-09-14: 10 mL via INTRAVENOUS

## 2018-09-15 ENCOUNTER — Telehealth: Payer: Self-pay | Admitting: *Deleted

## 2018-09-15 NOTE — Telephone Encounter (Signed)
-----   Message from [name], NP sent at 09/15/2018  9:08 AM EST ----- MRI of the brain with normal enhancement pattern and no acute findings when compared to 2014.  Please call the patient

## 2018-09-15 NOTE — Telephone Encounter (Signed)
Spoke to pt and relayed that the MRI brain with normal enhancement pattern (post contrast) and no acute findings compared to MRI 2014.  She is seeing pcp about ear (she had flu sx).   She verbalized understanding.

## 2018-09-30 ENCOUNTER — Telehealth: Payer: Self-pay | Admitting: *Deleted

## 2018-09-30 MED ORDER — INTERFERON BETA-1A 44 MCG/0.5ML ~~LOC~~ SOSY: PREFILLED_SYRINGE | SUBCUTANEOUS | 6 refills | Status: DC

## 2018-09-30 NOTE — Telephone Encounter (Signed)
Spoke to [facility] in [place] [phone] and they need prescription faxed to them (as pt is trying to get funding).  She is needing prescription only not enrollment form.  Fax to then at [phone].   Printed and to CM/NP for signature.

## 2018-09-30 NOTE — Telephone Encounter (Signed)
Received fax back from [place] and they are asking for rebidose.  I called and LMVM for pt to return call.

## 2018-09-30 NOTE — Telephone Encounter (Signed)
Signed copy faxed with confirmation received [phone].

## 2018-09-30 NOTE — Telephone Encounter (Signed)
Pt returned call. Please call back as soon as available.  °

## 2018-09-30 NOTE — Telephone Encounter (Signed)
I received another fax, stating needed prescription for rebidose.  Spoke to pt and she did not want rebidose.  She will call them.  I provided # and Member ID # [id number].

## 2018-10-23 ENCOUNTER — Other Ambulatory Visit: Payer: Medicare Other

## 2018-11-17 ENCOUNTER — Other Ambulatory Visit: Payer: Medicare Other

## 2019-01-19 ENCOUNTER — Other Ambulatory Visit: Payer: Self-pay

## 2019-01-19 ENCOUNTER — Ambulatory Visit
Admission: RE | Admit: 2019-01-19 | Discharge: 2019-01-19 | Disposition: A | Discharge status: Home / Self Care | Payer: Medicare Other | Source: Ambulatory Visit | Attending: Internal Medicine | Admitting: Internal Medicine

## 2019-01-19 DIAGNOSIS — M85859 Other specified disorders of bone density and structure, unspecified thigh: Secondary | ICD-10-CM

## 2019-03-02 ENCOUNTER — Ambulatory Visit: Payer: Medicare Other | Admitting: Neurology

## 2019-03-02 ENCOUNTER — Encounter: Payer: Self-pay | Admitting: Neurology

## 2019-03-02 ENCOUNTER — Other Ambulatory Visit: Payer: Self-pay

## 2019-03-02 VITALS — BP 112/68 | HR 59 | Temp 97.0°F | Ht 64.5 in | Wt 110.0 lb

## 2019-03-02 DIAGNOSIS — G43909 Migraine, unspecified, not intractable, without status migrainosus: Secondary | ICD-10-CM | POA: Diagnosis not present

## 2019-03-02 DIAGNOSIS — C541 Malignant neoplasm of endometrium: Secondary | ICD-10-CM | POA: Diagnosis not present

## 2019-03-02 DIAGNOSIS — Z79899 Other long term (current) drug therapy: Secondary | ICD-10-CM

## 2019-03-02 DIAGNOSIS — G35 Multiple sclerosis: Secondary | ICD-10-CM

## 2019-03-02 NOTE — Progress Notes (Signed)
[facility]  PATIENT: [name] DOB: July 10, 1946   REASON FOR VISIT: Follow-up for multiple sclerosis, migraine HISTORY FROM: Patient    HISTORY OF PRESENT ILLNESS: [name], a 73 year old female , retired , married female ,returns for followup.She was last seen 07/03/15 and has been followed for multiple sclerosis since October 2008. She has had 2 migraines in the past month, worse during allergy season  She is on Imitrex with good results. She does not want to be on a preventative medication . She has associated photophobia, she used to have a visual aura of Zig zag lines and lights , now it is more of a tunnel vision arising from the periphery centrally with tiny little light dots.The headaches last with a pounding throbbing character about 3 hours up to a whole day . She is fatigued afterwards. She is highly sensitive to scents and smells.  She is currently on Rebif tolerating the medication without significant injection reactions. She does not pretreat. She denies spasms, focal weakness, sensory changes, visual changes, speech or swallowing problems, no problems with bowel or bladder function. She has not fallen, she does not use an assistive device.  Last MRI of the brain 07/02/13 read as abnormal - MRI scan of brain showing multilple periventricular and supratentorial white matter lesions consistent with multiple sclerosis. No enhancing lesions are noted. The presence of T 1 black holes indicates chronic disease. Overall mild increase in white matter lesion load compared to MRI 05/24/2007. She returns for reevaluation   08/20/16 [name], a 73 year old  married female returns for follow up,  has been followed for multiple sclerosis since October 2008.  She has had 2 migraines in the past  3 month, worse during allergy season,   She ison Imitrex with good results. She does not want to be on a preventative medication . She has associated  photophobia, she used to have a visual aura of Zig zag lines and lights , now it is more of a tunnel vision arising from the periphery centrally with tiny little light dots.The headaches last with a pounding throbbing character about 3 hours up to a whole da . She is fatigued afterwards. She is highly sensitive to scents and smells.  She is currently on Rebif tolerating the medication without significant injection reactions. She does not pretreat. She denies spasms, focal weakness, sensory changes, visual changes, speech or swallowing problems, no problems with bowel or bladder function. She has not fallen, she does not use an assistive device.  Last MRI of the brain 07/02/13 read as abnormal - MRI scan of brain showing multilple periventricular and supratentorial white matter lesions consistent with multiple sclerosis. No enhancing lesions are noted. The presence of T 1 black holes indicates chronic disease. Overall mild increase in white matter lesion load compared to MRI 05/24/2007. She returns for reevaluation. No MRI since 2014 and no clinical relapse signs.   UPDATE UPDATE 1/10/2019[name], 73 year old female returns for follow-up with a history of multiple sclerosis since October 2008.  She also has a history of migraines for which she uses Imitrex.  She has had more headaches recently due to the holidays however she is not interested in being on a preventive medication.  She awoke with a headache this morning took some Tylenol then later Imitrex with relief of her headache.  Her headaches have a pounding character and she can have a visual aura of zigzag lines and lights.  She remains on rebif  for relapsing remitting multiple sclerosis.  She denies injection site issues.  She denies any weakness sensory changes visual changes bowel or bladder problems,.  She is fairly active.  Last MRI of 2014 and she has not had any clinical relapse signs.  She is not interested in having an MRI.   She returns for reevaluation UPDATE 7/11/2019[name], 73 year old female returns for follow-up with history of multiple sclerosis since 2008.  She also has a history of migraines for which she uses Imitrex.  Her headaches are too infrequent to be on a preventive medication.  She is also on rebif relapsing remitting multiple sclerosis without injection site issues.  She denies any new visual changes sensory changes bowel or bladder problems weakness she remains fairly active.  Last MRI of the brain in 2014 and she has not had any clinical relapse.  She has no interest in having another MRI.  She returns for reevaluation   Update 03-02-2019, [name], a retired 73 year old Caucasian married female has been active in her church, and has lost mother and mother in-law in recent years, of both she was taking care. She has been doing well since 2008 on REBIF injections and continues .  She has currently less Migraine, she calls her friends. She mets outside, but mises contact.  She drinks tea occasionally. A coffee in AM- and she is not taking sodas, alcohol, and is not a tobaco user.      ALLERGIES: Allergies  Allergen Reactions  . Penicillins Rash    As a child. Has patient had a PCN reaction causing immediate rash, facial/tongue/throat swelling, SOB or lightheadedness with hypotension: No Has patient had a PCN reaction causing severe rash involving mucus membranes or skin necrosis: No Has patient had a PCN reaction that required hospitalization: No Has patient had a PCN reaction occurring within the last 10 years: No If all of the above answers are "NO", then may proceed with Cephalosporin use.   . Codeine Nausea And Vomiting    HOME MEDICATIONS: Outpatient Medications Prior to Visit  Medication Sig Dispense Refill  . acetaminophen (TYLENOL) 160 MG chewable tablet Chew 160 mg by mouth every 6 (six) hours as needed for pain.    . COLLAGEN PO Take 1 tablet by mouth daily.    .  interferon beta-1a (REBIF) 44 MCG/0.5ML SOSY injection Inject  44 mcg Sub-q 3 times weekly Rotate site after each injection 12 Syringe 6  . latanoprost (XALATAN) 0.005 % ophthalmic solution Place 1 drop into both eyes at bedtime.  6  . levothyroxine (SYNTHROID, LEVOTHROID) 88 MCG tablet Take 88 mcg by mouth See admin instructions. Takes before breakfast Monday through Friday only.    . Multiple Vitamin (MULTIVITAMIN WITH MINERALS) TABS tablet Take 1 tablet by mouth daily.    . SUMAtriptan (IMITREX) 100 MG tablet TAKE 1 TABLET BY MOUTH AS NEEDED FOR HEADACHE MAY REPEAT IN 2 HOURS IF HEADACHE PERSIST 10 tablet 3   No facility-administered medications prior to visit.     PAST MEDICAL HISTORY: Past Medical History:  Diagnosis Date  . Endometrial cancer ([place])   . Family history of adverse reaction to anesthesia    mother ponv  . Migraine   . Multiple sclerosis ([place])     PAST SURGICAL HISTORY: Past Surgical History:  Procedure Laterality Date  . cataracts Bilateral   . COLONOSCOPY WITH PROPOFOL N/A 12/11/2015   Procedure: COLONOSCOPY WITH PROPOFOL;  Surgeon: Garlan Fair, MD;  Location: [facility];  Service: Endoscopy;  Laterality: N/A;  . salzman nodules  12/2014   Had one eye then the other on 01/2015  . TONSILLECTOMY    . VAGINAL HYSTERECTOMY     complete    FAMILY HISTORY: Family History  Problem Relation Age of Onset  . Stroke Mother   . Congestive Heart Failure Mother   . Migraines Sister     SOCIAL HISTORY: Social History   Socioeconomic History  . Marital status: Married    Spouse name: [name]  . Number of children: 1  . Years of education: 29  . Highest education level: Not on file  Occupational History    Employer: [employer]  . Financial resource strain: Not on file  . Food insecurity    Worry: Not on file    Inability: Not on file  . Transportation needs    Medical: Not on file    Non-medical: Not on file  Tobacco Use  . Smoking status:  Never Smoker  . Smokeless tobacco: Never Used  Substance and Sexual Activity  . Alcohol use: Yes    Alcohol/week: 1.0 standard drinks    Types: 1 Glasses of wine per week    Comment: wine on occ  . Drug use: No  . Sexual activity: Not on file  Lifestyle  . Physical activity    Days per week: Not on file    Minutes per session: Not on file  . Stress: Not on file  Relationships  . Social Herbalist on phone: Not on file    Gets together: Not on file    Attends religious service: Not on file    Active member of club or organization: Not on file    Attends meetings of clubs or organizations: Not on file    Relationship status: Not on file  . Intimate partner violence    Fear of current or ex partner: Not on file    Emotionally abused: Not on file    Physically abused: Not on file    Forced sexual activity: Not on file  Other Topics Concern  . 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   Patient has one child.   Patient is retired.   Patient has a college education.   Patient is right-handed.   Patient drinks very little caffeine.     PHYSICAL EXAM  Vitals:   03/02/19 0921  BP: 112/68  Pulse: (!) 59  Temp: (!) 97 F (36.1 C)  Weight: 110 lb (49.9 kg)  Height: 5' 4.5" (1.638 m)   Body mass index is 18.59 kg/m. Generalized: Well developed, in no acute distress  Head: normocephalic and atraumatic.  Oropharynx benign  Neck: Supple,  Musculoskeletal: No deformity   General: The patient is awake, alert and appears in no acute distress. The patient is well groomed. HEENT: Normocephalic, atraumatic.  Face is symmetric with no facial masking.  Pupils are equal, round & reactive to light & accomodation.  Extraocular tracking shows no nystagmus .  Hearing grossly intact.  Speech is clear and not  hypophonic with no dysarthria noted.    Oropharynx exam reveals: Tongue protrudes centrally & palate elevates  symmetrically.  Tonsils are not present. Neck:  Supple. Cardiovascular:  Regular rate and rhythm , without murmurs, rubs or carotid bruit. She has a mitral valve click- likely prolaps related.  Respiratory: Lungs are clear   Extremities:  There  is no pitting edema in the distal lower extremities bilaterally.  Pedal pulses are intact. Skin:  Warm & dry without trophic changes noted.   Musculoskeletal:  Exam reveals no joint deformities, tenderness, swelling or erythema.   Neurologic exam : Mental Status:  The patient is awake, alert, oriented to all 4 spheres.  Speech is clear with normal prosody & enunciation.  Thought process is linear.  Cranial nerves: Described above under HEENT exam.  In addition, shoulder shrug is normal with equal shoulder height noted. Motor exam: Normal tone and normal muscle bulk and symmetric normal strength in all extremities.  No resting tremor, no postural or action tremor.  Romberg is negative.  Reflexes are 2+ throughout. Fine Motor Testing: No impairment of finger taps.  Normal hand movements.  Cerebellar testing:  No dysmetria or intention tremor on finger-to-nose testing.   There is no truncal or gait ataxia. Sensory:  Fine touch, vibration were normal in all extremities.     DIAGNOSTIC DATA (LABS, IMAGING, TESTING):  Glucose 65 - 99 mg/dL 95  94  91 R   Comment: Specimen received in contact with cells. No visible hemolysis  present. However GLUC may be decreased and K increased. Clinical  correlation indicated.   BUN 8 - 27 mg/dL '18  14  13 ' R   Creatinine, Ser 0.57 - 1.00 mg/dL 0.93  0.71  0.73 R   GFR calc non Af Amer >59 mL/min/1.73 63  88  >60 R   GFR calc Af Amer >59 mL/min/1.73 73  101  >60      The eGFR has been calculated  using the MDRD equation.  This calculation has not been  validated in all clinical  R    BUN/Creatinine Ratio 12 - '28 19  20    ' Sodium 134 - 144 mmol/L 143  143  142 R   Potassium 3.5 - 5.2 mmol/L 4.5  4.3  3.1Low  R    Comment: Specimen received in contact with cells. No visible hemolysis  present. However GLUC may be decreased and K increased. Clinical  correlation indicated.   Chloride 96 - 106 mmol/L 103  101  107 R   CO2 20 - 29 mmol/L 22  30High  R  29 R   Calcium 8.7 - 10.3 mg/dL 10.1  9.5  8.7 R   Total Protein 6.0 - 8.5 g/dL 7.1  6.8    Albumin 3.6 - 4.8 g/dL 4.9High   4.5    Globulin, Total 1.5 - 4.5 g/dL 2.2  2.3    Albumin/Globulin Ratio 1.2 - 2.2 2.2  2.0    Bilirubin Total 0.0 - 1.2 mg/dL 0.6  0.3    Alkaline Phosphatase 39 - 117 IU/L 77  100    AST 0 - 40 IU/L 27  25    ALT 0 - 32 IU/L 16  20    Resulting Agency  [facility]    Narrative Performed by: Maryan Puls Performed at: [address]  [address], [place], [place] [id number]  Lab Director: Rush Farmer MD, Phone: [phone]    Specimen Collected: 08/21/17 11:59 Last Resulted: 08/22/17 05:40     Lab Flowsheet   Order Details   View Encounter   Lab and Collection Details   Routing   Result History     R=Reference range differs from displayed range      Other Results from 08/21/2017  Result Notes for CBC with Differential/Platelet  Notes recorded  by Minna Antis, RN on 08/22/2017 at 8:42 AM EST  Spoke with patient and informed her that her labs are stable. She verbalized understanding, appreciation.  ------   Notes recorded by [name], NP on 08/22/2017 at 8:28 AM EST  Labs stable please call the patient  Follow-up Communication for CBC with Differential/Platelet   08/22/2017 Telephone     Contains abnormal data CBC with Differential/Platelet Order: [id number]  Status:  Final result Visible to patient:  Yes (MyChart) Next appt:  None Dx:  MS (multiple sclerosis) ([place]); Therap...  Ref Range & Units 62yrago 210yrgo [id number]  WBC 3.4 - 10.8 x10E3/uL 5.3  6.4  4.6 R   RBC 3.77 - 5.28 x10E6/uL 4.19  3.85  3.60Low  R   Hemoglobin 11.1 - 15.9 g/dL 14.1  13.0   12.8 RESULT CALLED TO, READ BACK BY AND VERIFIED WITH: PENNY SHELTON,RN AT 1322 12/05/09 BY ZPERRY. R   Hematocrit 34.0 - 46.6 % 41.6  38.6  36.3 R   MCV 79 - 97 fL 99High   100High   100.9High  R   MCH 26.6 - 33.0 pg 33.7High   33.8High     MCHC 31.5 - 35.7 g/dL 33.9  33.7  35.3 R   RDW 12.3 - 15.4 % 14.0  13.7  13.1 R   Platelets 150 - 379 x10E3/uL 226  221  147Low  R   Neutrophils Not Estab. % 55  60    Lymphs Not Estab. % 30  25    Monocytes Not Estab. % 11  10    Eos Not Estab. % 4  4    Basos Not Estab. % 0  1    Neutrophils Absolute 1.4 - 7.0 x10E3/uL 2.9  3.8    Lymphocytes Absolute 0.7 - 3.1 x10E3/uL 1.6  1.6    Monocytes Absolute 0.1 - 0.9 x10E3/uL 0.6  0.6    EOS (ABSOLUTE) 0.0 - 0.4 x10E3/uL 0.2  0.3    Basophils Absolute 0.0 - 0.2 x10E3/uL 0.0  0.0    Immature Granulocytes Not Estab. % 0  0    Immature Grans (Abs) 0.0 - 0.1 x10E3/uL 0.0  0.0    Resulting Agency  [facility]    Narrative Performed by: LabMaryan Pulsrformed at: [id number] Director: SanRush Farmer, Phone: [phone] Specimen Collected: 08/21/17 11:59 Last Resulted: 08/22/17 05:40     Lab Flowsheet   Order Details   View Encounter   Lab and Collection Details   Routing   Result History     R=Reference range differs from displayed range      Result Notes for Comprehensive metabolic panel   MS patient on Rebif , MS diagnosed October 2008. Migraine is well controlled.   The Patient suffered from endometrial cancer in 2009, had no recurrence. She was never on hormone replacement therapy.  She took hormonal contraceptives in her 30s52s ASSESSMENT AND PLAN [age]. year old female has a past medical history of Migraine;; and [place]. She has not had exacerbation of MS symptoms since last seen. Imitrex is used acutely for migraines.  Patient does not wish to be on a preventive  medication.   Please note that I reviewed the actual images with the patient and there are vascular small vessel disease changes and some small lesions of demyelinating origin( pontine ,  right cortical u -fibers affected.)  PLAN: Continue Rebif 3 times a week,   will refill at current dose.  Will repeat CBC and diff, CMET.  Last MRI was 09-14-2018, no repeat needed unless clinical changes are noted  Continue sumatriptan when necessary acute migraine  Exercise for overall health, healthy diet Follow-up in 6 months  Call for any exacerbation of MS symptoms   Larey Seat, MD  [facility] [address], [place], [place] [address] [phone]

## 2019-03-02 NOTE — Patient Instructions (Signed)
Interferon Beta-1a injection What is this medicine? INTERFERON BETA-1a (in ter FEER on BAY ta wun aye) helps to decrease the number of multiple sclerosis attacks and to slow physical disability in people with relapsing forms of the disease. The medicine does not cure multiple sclerosis. This medicine may be used for other purposes; ask your health care provider or pharmacist if you have questions. COMMON BRAND NAME(S): Avonex, Rebif, Rebif Rebidose What should I tell my health care provider before I take this medicine? They need to know if you have any of these conditions:  depression  drink more than 3 alcohol containing drinks per day  heart disease or irregular heart beats/rhythm  immune system problems  liver disease  seizures  thyroid disease  an unusual or allergic reaction to interferon, hamster proteins, albumin, mannitol, or other medicines, foods, dyes, or preservatives  pregnant or trying to get pregnant  breast-feeding How should I use this medicine? Rebif is for injection under the skin. Avonex is for injection into a muscle. You will be taught how to prepare and give this medicine. Use exactly as directed. Take your medicine at regular intervals. Do not take it more often than directed. It is important that you put your used needles and syringes in a special sharps container. Do not put them in a trash can. If you do not have a sharps container, call your pharmacist or healthcare provider to get one. A special MedGuide will be given to you by the pharmacist with each prescription and refill. Be sure to read this information carefully each time. Talk to your pediatrician regarding the use of this medicine in children. Special care may be needed. The manufacturer of the medicine offers free information to patients and their health care partners. Call 629-181-5021 for more information about Rebif. Call (847)[phone] for more information about Avonex. Overdosage: If you  think you have taken too much of this medicine contact a poison control center or emergency room at once. NOTE: This medicine is only for you. Do not share this medicine with others. What if I miss a dose? If you miss a dose, take it as soon as you can. If it is almost time for your next dose, take only that dose. Do not take double or extra doses. Do not give 2 injections within 2 days of each other. If you accidentally take a dose on 2 consecutive days, call your doctor or health care professional. What may interact with this medicine?  zidovudine, AZT This list may not describe all possible interactions. Give your health care provider a list of all the medicines, herbs, non-prescription drugs, or dietary supplements you use. Also tell them if you smoke, drink alcohol, or use illegal drugs. Some items may interact with your medicine. What should I watch for while using this medicine? Tell your doctor or healthcare professional if your symptoms do not start to get better or if they get worse. Visit your doctor or health care professional for regular checks on your progress. You will need frequent blood checks. Flu-like symptoms are common with the medicine. Using this medicine at night may help. Ask your doctor or health care professional about taking acetaminophen or ibuprofen before your dose and for 24 hours after you receive your injection. Do not become pregnant while taking this medicine. Women should inform their doctor if they wish to become pregnant or think they might be pregnant. There is a potential for serious side effects to an unborn child. Talk to your  health care professional or pharmacist for more information. What side effects may I notice from receiving this medicine? Side effects that you should report to your doctor or health care professional as soon as possible:  a skin sore with a black-blue color, swelling, or drainage  allergic reactions like skin rash, itching or hives,  swelling of the face, lips, or tongue  breathing problems  depression  seizures  severe stomach pain  signs and symptoms of infection like fever or chills,cough, sore throat, pain or difficulty passing urine  swelling of the ankles  unusual bleeding or bruising  unusually weak or tired  yellowing of the eyes or skin Side effects that usually do not require medical attention (report to your doctor or health care professional if they continue or are bothersome):  dizziness  headache  menstrual changes  muscle aches  nausea  pain, redness, or irritation at site where injected  tiredness This list may not describe all possible side effects. Call your doctor for medical advice about side effects. You may report side effects to FDA at 1-800-FDA-1088. Where should I keep my medicine? Keep out of the reach of children. Rebif: Store in a refrigerator between 2 and 8 degrees C (36 and 46 degrees F). Do not freeze. If a refrigerator is not available, the medicine can be kept cool at or below 25 degrees C (77 degrees F) for up to 30 days. Protect from light. Throw away any unused medicine after the expiration date. Avonex Vials: Store in a refrigerator between 2 and 8 degrees C (36 and 46 degrees F). Do not freeze. If a refrigerator is not available, the vials may be kept at room temperature at or below 25 degrees C (77 degrees F) for up to 30 days. Protect from light. Throw away any unused solution. Avonex Pre-filled syringes: Store in a refrigerator between 2 and 8 degrees C (36 and 46 degrees F). Do not freeze. If a refrigerator is not available, the pre-filled syringe can be stored at a temperature at or below 25 degrees C (77 degrees F) for up to 7 days. Protect from light. Avonex Auto-Injector Pens: Store in a refrigerator between 2 and 8 degrees C (36 and 46 degrees F). Do not freeze. If a refrigerator is not available, the auto-injector pen may be stored at a temperature at or  below 25 degrees C (77 degrees F) for up to 7 days. Protect from light. NOTE: This sheet is a summary. It may not cover all possible information. If you have questions about this medicine, talk to your doctor, pharmacist, or health care provider.  2020 Elsevier/Gold Standard (2013-11-17 10:50:38)

## 2019-03-03 ENCOUNTER — Encounter: Payer: Self-pay | Admitting: Neurology

## 2019-03-03 LAB — CBC WITH DIFFERENTIAL/PLATELET
Basophils Absolute: 0 10*3/uL (ref 0.0–0.2)
Basos: 1 %
EOS (ABSOLUTE): 0.2 10*3/uL (ref 0.0–0.4)
Eos: 4 %
Hematocrit: 42.1 % (ref 34.0–46.6)
Hemoglobin: 14.1 g/dL (ref 11.1–15.9)
Immature Grans (Abs): 0 10*3/uL (ref 0.0–0.1)
Immature Granulocytes: 0 %
Lymphocytes Absolute: 1.3 10*3/uL (ref 0.7–3.1)
Lymphs: 28 %
MCH: 32.7 pg (ref 26.6–33.0)
MCHC: 33.5 g/dL (ref 31.5–35.7)
MCV: 98 fL — ABNORMAL HIGH (ref 79–97)
Monocytes Absolute: 0.4 10*3/uL (ref 0.1–0.9)
Monocytes: 9 %
Neutrophils Absolute: 2.8 10*3/uL (ref 1.4–7.0)
Neutrophils: 58 %
Platelets: 200 10*3/uL (ref 150–450)
RBC: 4.31 x10E6/uL (ref 3.77–5.28)
RDW: 12.3 % (ref 11.7–15.4)
WBC: 4.8 10*3/uL (ref 3.4–10.8)

## 2019-03-08 ENCOUNTER — Telehealth: Payer: Self-pay | Admitting: Neurology

## 2019-03-08 NOTE — Telephone Encounter (Signed)
Called and reviewed with the patient about the lab work and advised of the findings. Pt had no questions at this time but was encouraged to call back if questions arise. Pt verbalized understanding.

## 2019-03-08 NOTE — Telephone Encounter (Signed)
-----   Message from Larey Seat, MD sent at 03/03/2019  9:09 AM EDT ----- Improving MCV - normal MCH- no changes in MS therapy needed.

## 2019-04-20 ENCOUNTER — Other Ambulatory Visit: Payer: Self-pay | Admitting: Neurology

## 2019-04-20 MED ORDER — REBIF 44 MCG/0.5ML ~~LOC~~ SOSY: PREFILLED_SYRINGE | SUBCUTANEOUS | 1 refills | Status: DC

## 2019-05-20 ENCOUNTER — Other Ambulatory Visit: Payer: Self-pay | Admitting: Internal Medicine

## 2019-05-20 DIAGNOSIS — Z1231 Encounter for screening mammogram for malignant neoplasm of breast: Secondary | ICD-10-CM

## 2019-05-24 ENCOUNTER — Ambulatory Visit
Admission: RE | Admit: 2019-05-24 | Discharge: 2019-05-24 | Disposition: A | Discharge status: Home / Self Care | Payer: Medicare Other | Source: Ambulatory Visit | Attending: Internal Medicine | Admitting: Internal Medicine

## 2019-05-24 ENCOUNTER — Other Ambulatory Visit: Payer: Self-pay

## 2019-05-24 DIAGNOSIS — Z1231 Encounter for screening mammogram for malignant neoplasm of breast: Secondary | ICD-10-CM

## 2019-08-17 ENCOUNTER — Other Ambulatory Visit: Payer: Self-pay | Admitting: Neurology

## 2019-08-17 ENCOUNTER — Telehealth: Payer: Self-pay

## 2019-08-17 MED ORDER — REBIF 44 MCG/0.5ML ~~LOC~~ SOSY: PREFILLED_SYRINGE | SUBCUTANEOUS | 1 refills | Status: DC

## 2019-08-17 NOTE — Telephone Encounter (Signed)
Refill sent for the patient.  

## 2019-08-17 NOTE — Telephone Encounter (Signed)
1) Medication(s) Requested (by name): interferon beta-1a (REBIF) 44 MCG/0.5ML SOSY injection  2) Pharmacy of Choice:  [facility] [place]  [id number], [place] [address]

## 2019-09-15 ENCOUNTER — Other Ambulatory Visit: Payer: Self-pay | Admitting: Neurology

## 2019-09-16 ENCOUNTER — Other Ambulatory Visit: Payer: Self-pay | Admitting: Neurology

## 2019-09-16 MED ORDER — REBIF 44 MCG/0.5ML ~~LOC~~ SOSY: 44.0000 ug | PREFILLED_SYRINGE | SUBCUTANEOUS | 1 refills | Status: DC

## 2020-03-07 ENCOUNTER — Other Ambulatory Visit: Payer: Self-pay | Admitting: Neurology

## 2020-03-08 ENCOUNTER — Ambulatory Visit: Payer: Medicare Other | Admitting: Neurology

## 2020-03-08 ENCOUNTER — Encounter: Payer: Self-pay | Admitting: Neurology

## 2020-03-08 ENCOUNTER — Telehealth: Payer: Self-pay | Admitting: Neurology

## 2020-03-08 VITALS — BP 97/53 | HR 58 | Ht 64.5 in | Wt 109.0 lb

## 2020-03-08 DIAGNOSIS — G43909 Migraine, unspecified, not intractable, without status migrainosus: Secondary | ICD-10-CM | POA: Diagnosis not present

## 2020-03-08 DIAGNOSIS — G35 Multiple sclerosis: Secondary | ICD-10-CM | POA: Diagnosis not present

## 2020-03-08 DIAGNOSIS — Z79899 Other long term (current) drug therapy: Secondary | ICD-10-CM | POA: Diagnosis not present

## 2020-03-08 NOTE — Telephone Encounter (Signed)
[employer] medicare no auth. Patient is scheduled at GI for 04/01/20. I spoke with Erline Levine at GI radiology to let her know that Dr. Brett Fairy wants Dr. Felecia Shelling to read it.

## 2020-03-08 NOTE — Patient Instructions (Signed)
Multiple Sclerosis  Multiple sclerosis (MS) is a disease of the brain, spinal cord, and optic nerves (central nervous system). It causes the body's disease-fighting (immune) system to destroy the protective covering (myelin sheath) around nerves in the brain. When this happens, signals (nerve impulses) going to and from the brain and spinal cord do not get sent properly or may not get sent at all.  There are several types of MS:  · Relapsing-remitting MS. This is the most common type. This causes sudden attacks of symptoms. After an attack, you may recover completely until the next attack, or some symptoms may remain permanently.  · Secondary progressive MS. This usually develops after the onset of relapsing-remitting MS. Similar to relapsing-remitting MS, this type also causes sudden attacks of symptoms. Attacks may be less frequent, but symptoms slowly get worse (progress) over time.  · Primary progressive MS. This causes symptoms that steadily progress over time. This type of MS does not cause sudden attacks of symptoms.  The age of onset of MS varies, but it often develops between 20-40 years of age. MS is a lifelong (chronic) condition. There is no cure, but treatment can help slow down the progression of the disease.  What are the causes?  The cause of this condition is not known.  What increases the risk?  You are more likely to develop this condition if:  · You are a woman.  · You have a relative with MS. However, the condition is not passed from parent to child (inherited).  · You have a lack (deficiency) of vitamin D.  · You smoke.  MS is more common in the [place] than in the southern [place].  What are the signs or symptoms?  Relapsing-remitting and secondary progressive MS cause symptoms to occur in episodes or attacks that may last weeks to months. There may be long periods between attacks in which there are almost no symptoms. Primary progressive MS causes symptoms to steadily  progress after they develop.  Symptoms of MS vary because of the many different ways it affects the central nervous system. The main symptoms include:  · Vision problems and eye pain.  · Numbness.  · Weakness.  · Inability to move your arms, hands, feet, or legs (paralysis).  · Balance problems.  · Shaking that you cannot control (tremors).  · Muscle spasms.  · Problems with thinking (cognitive changes).  MS can also cause symptoms that are associated with the disease, but are not always the direct result of an MS attack. They may include:  · Inability to control urination or bowel movements (incontinence).  · Headaches.  · Fatigue.  · Inability to tolerate heat.  · Emotional changes.  · Depression.  · Pain.  How is this diagnosed?  This condition is diagnosed based on:  · Your symptoms.  · A neurological exam. This involves checking central nervous system function, such as nerve function, reflexes, and coordination.  · MRIs of the brain and spinal cord.  · Lab tests, including a lumbar puncture that tests the fluid that surrounds the brain and spinal cord (cerebrospinal fluid).  · Tests to measure the electrical activity of the brain in response to stimulation (evoked potentials).  How is this treated?  There is no cure for MS, but medicines can help decrease the number and frequency of attacks and help relieve nuisance symptoms. Treatment options may include:  · Medicines that reduce the frequency of attacks. These medicines may be given by   you move around (assistive devices), such as braces, a cane, or a walker.  Physical therapy to strengthen and stretch your muscles.  Occupational therapy to help you with everyday  tasks.  Alternative or complementary treatments such as exercise, massage, or acupuncture. Follow these instructions at home:  Take over-the-counter and prescription medicines only as told by your health care provider.  Do not drive or use heavy machinery while taking prescription pain medicine.  Use assistive devices as recommended by your physical therapist or your health care provider.  Exercise as directed by your health care provider.  Return to your normal activities as told by your health care provider. Ask your health care provider what activities are safe for you.  Reach out for support. Share your feelings with friends, family, or a support group.  Keep all follow-up visits as told by your health care provider and therapists. This is important. Where to find more information  National Multiple Sclerosis Society: https://www.nationalmssociety.org Contact a health care provider if:  You feel depressed.  You develop new pain or numbness.  You have tremors.  You have problems with sexual function. Get help right away if:  You develop paralysis.  You develop numbness.  You have problems with your bladder or bowel function.  You develop double vision.  You lose vision in one or both eyes.  You develop suicidal thoughts.  You develop severe confusion. If you ever feel like you may hurt yourself or others, or have thoughts about taking your own life, get help right away. You can go to your nearest emergency department or call:  Your local emergency services (911 in the [place]  A suicide crisis helpline, such as the National Suicide Prevention Lifeline at 1-800-273-8255. This is open 24 hours a day. Summary  Multiple sclerosis (MS) is a disease of the central nervous system that causes the body's immune system to destroy the protective covering (myelin sheath) around nerves in the brain.  There are 3 types of MS: relapsing-remitting, secondary progressive, and  primary progressive. Relapsing-remitting and secondary progressive MS cause symptoms to occur in episodes or attacks that may last weeks to months. Primary progressive MS causes symptoms to steadily progress after they develop.  There is no cure for MS, but medicines can help decrease the number and frequency of attacks and help relieve nuisance symptoms. Treatment may also include physical or occupational therapy.  If you develop numbness, paralysis, vision problems, or other neurological symptoms, get help right away. This information is not intended to replace advice given to you by your health care provider. Make sure you discuss any questions you have with your health care provider. Document Revised: 07/11/2017 Document Reviewed: 10/07/2016 Elsevier Patient Education  2020 Elsevier Inc.  

## 2020-03-08 NOTE — Progress Notes (Addendum)
[facility]  PATIENT: [name] DOB: 12-01-1945   REASON FOR VISIT: Follow-up for multiple sclerosis, migraine HISTORY FROM: Patient    HISTORY OF PRESENT ILLNESS: [name], a 74 year old retired , married female returns for followup.She was last seen 07/03/15 and has been followed for multiple sclerosis since October 2008. She has had 2 migraines in the past month, worse during allergy season  She is on Imitrex with good results. She does not want to be on a preventative medication . She has associated photophobia, she used to have a visual aura of Zig zag lines and lights , now it is more of a tunnel vision arising from the periphery centrally with tiny little light dots.The headaches last with a pounding throbbing character about 3 hours up to a whole day . She is fatigued afterwards. She is highly sensitive to scents and smells.  She is currently on Rebif tolerating the medication without significant injection reactions. She does not pretreat. She denies spasms, focal weakness, sensory changes, visual changes, speech or swallowing problems, no problems with bowel or bladder function. She has not fallen, she does not use an assistive device.  Last MRI of the brain 07/02/13 read as abnormal - MRI scan of brain showing multilple periventricular and supratentorial white matter lesions consistent with multiple sclerosis. No enhancing lesions are noted. The presence of T 1 black holes indicates chronic disease. Overall mild increase in white matter lesion load compared to MRI 05/24/2007. She returns for reevaluation   08/20/16 [name], a 74 year old  married female returns for follow up,  has been followed for multiple sclerosis since October 2008.  She has had 2 migraines in the past  3 month, worse during allergy season,   She ison Imitrex with good results. She does not want to be on a preventative medication . She has associated  photophobia, she used to have a visual aura of Zig zag lines and lights , now it is more of a tunnel vision arising from the periphery centrally with tiny little light dots.The headaches last with a pounding throbbing character about 3 hours up to a whole da . She is fatigued afterwards. She is highly sensitive to scents and smells.  She is currently on Rebif tolerating the medication without significant injection reactions. She does not pretreat. She denies spasms, focal weakness, sensory changes, visual changes, speech or swallowing problems, no problems with bowel or bladder function. She has not fallen, she does not use an assistive device.  Last MRI of the brain 07/02/13 read as abnormal - MRI scan of brain showing multilple periventricular and supratentorial white matter lesions consistent with multiple sclerosis. No enhancing lesions are noted. The presence of T 1 black holes indicates chronic disease. Overall mild increase in white matter lesion load compared to MRI 05/24/2007. She returns for reevaluation. No MRI since 2014 and no clinical relapse signs.   UPDATE UPDATE 1/10/2019[name], 74 year old female returns for follow-up with a history of multiple sclerosis since October 2008.  She also has a history of migraines for which she uses Imitrex.  She has had more headaches recently due to the holidays however she is not interested in being on a preventive medication.  She awoke with a headache this morning took some Tylenol then later Imitrex with relief of her headache.  Her headaches have a pounding character and she can have a visual aura of zigzag lines and lights.  She remains on rebif  for relapsing  remitting multiple sclerosis.  She denies injection site issues.  She denies any weakness sensory changes visual changes bowel or bladder problems,.  She is fairly active.  Last MRI of 2014 and she has not had any clinical relapse signs.  She is not interested in having an MRI.   She returns for reevaluation UPDATE 7/11/2019[name], 74 year old female returns for follow-up with history of multiple sclerosis since 2008.  She also has a history of migraines for which she uses Imitrex.  Her headaches are too infrequent to be on a preventive medication.  She is also on rebif relapsing remitting multiple sclerosis without injection site issues.  She denies any new visual changes sensory changes bowel or bladder problems weakness she remains fairly active.  Last MRI of the brain in 2014 and she has not had any clinical relapse.  She has no interest in having another MRI.  She returns for reevaluation   Update 03-02-2019, [name], a retired 74 year old Caucasian married female has been active in her church, and has lost mother and mother in-law in recent years, of both she was taking care. She has been doing well since 2008 on REBIF injections and continues .  She has currently less Migraine, she calls her friends. She mets outside, but mises contact.  She drinks tea occasionally. A coffee in AM- and she is not taking sodas, alcohol, and is not a tobaco user.   RV on 03-08-2020. MS patient since 2008 followed at [facility]- 74 year- old. Regular exerciser, moderate diet. Healthy looking.  Patient has rarely Migraines now, visual aura is tunnel vision- last one yesterday, but aura did not evolve into the full headaches.  This is a routine RV.      ALLERGIES: Allergies  Allergen Reactions  . Penicillins Rash    As a child. Has patient had a PCN reaction causing immediate rash, facial/tongue/throat swelling, SOB or lightheadedness with hypotension: No Has patient had a PCN reaction causing severe rash involving mucus membranes or skin necrosis: No Has patient had a PCN reaction that required hospitalization: No Has patient had a PCN reaction occurring within the last 10 years: No If all of the above answers are "NO", then may proceed with Cephalosporin use.   .  Codeine Nausea And Vomiting    HOME MEDICATIONS: Outpatient Medications Prior to Visit  Medication Sig Dispense Refill  . acetaminophen (TYLENOL) 500 MG tablet Take 500-1,000 mg by mouth every 6 (six) hours as needed.    . Calcium Carbonate (CALCIUM 500 PO) Take 2 tablets by mouth daily.    . COLLAGEN PO Take 1 tablet by mouth daily.    [name] latanoprost (XALATAN) 0.005 % ophthalmic solution Place 1 drop into both eyes at bedtime.  6  . levothyroxine (SYNTHROID, LEVOTHROID) 88 MCG tablet Take 88 mcg by mouth See admin instructions. Takes before breakfast Monday through Friday only.    . Multiple Vitamin (MULTIVITAMIN WITH MINERALS) TABS tablet Take 1 tablet by mouth daily.    [name] REBIF 44 MCG/0.5ML SOSY injection Inject  44 mcg Sub-q 3 times weekly Rotate site after each injection 18 mL 0  . SUMAtriptan (IMITREX) 100 MG tablet TAKE 1 TABLET BY MOUTH AS NEEDED FOR HEADACHE MAY REPEAT IN 2 HOURS IF HEADACHE PERSIST 10 tablet 3  . acetaminophen (TYLENOL) 160 MG chewable tablet Chew 160 mg by mouth every 6 (six) hours as needed for pain.     No facility-administered medications prior to visit.  PAST MEDICAL HISTORY: Past Medical History:  Diagnosis Date  . Endometrial cancer ([place])   . Family history of adverse reaction to anesthesia    mother ponv  . Migraine   . Multiple sclerosis ([place])     PAST SURGICAL HISTORY: Past Surgical History:  Procedure Laterality Date  . cataracts Bilateral   . COLONOSCOPY WITH PROPOFOL N/A 12/11/2015   Procedure: COLONOSCOPY WITH PROPOFOL;  Surgeon: Garlan Fair, MD;  Location: [facility];  Service: Endoscopy;  Laterality: N/A;  . salzman nodules  12/2014   Had one eye then the other on 01/2015  . TONSILLECTOMY    . VAGINAL HYSTERECTOMY     complete    FAMILY HISTORY: Family History  Problem Relation Age of Onset  . Stroke Mother   . Congestive Heart Failure Mother   . Migraines Sister     SOCIAL HISTORY: Social History   Socioeconomic  History  . Marital status: Married    Spouse name: [name]  . Number of children: 1  . Years of education: 60  . Highest education level: Not on file  Occupational History    Employer: [employer]  Tobacco Use  . Smoking status: Never Smoker  . Smokeless tobacco: Never Used  Substance and Sexual Activity  . Alcohol use: Yes    Alcohol/week: 1.0 standard drink    Types: 1 Glasses of wine per week    Comment: wine on occ  . Drug use: No  . Sexual activity: Not on file  Other Topics Concern  . 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   Patient has one child.   Patient is retired.   Patient has a college education.   Patient is right-handed.   Patient drinks very little caffeine.   Social Determinants of Health   Financial Resource Strain:   . Difficulty of Paying Living Expenses:   Food Insecurity:   . Worried About Charity fundraiser in the Last Year:   . Arboriculturist in the Last Year:   Transportation Needs:   . Film/video editor (Medical):   [name] Lack of Transportation (Non-Medical):   Physical Activity:   . Days of Exercise per Week:   . Minutes of Exercise per Session:   Stress:   . Feeling of Stress :   Social Connections:   . Frequency of Communication with Friends and Family:   . Frequency of Social Gatherings with Friends and Family:   . Attends Religious Services:   . Active Member of Clubs or Organizations:   . Attends Archivist Meetings:   [name] Marital Status:   Intimate Partner Violence:   . Fear of Current or Ex-Partner:   . Emotionally Abused:   [name] Physically Abused:   . Sexually Abused:      PHYSICAL EXAM  Vitals:   03/08/20 1008  BP: (!) 97/53  Pulse: 58  Weight: 109 lb (49.4 kg)  Height: 5' 4.5" (1.638 m)   Body mass index is 18.42 kg/m. Generalized: Well developed, in no acute distress  Head: normocephalic and atraumatic. Oropharynx benign  Neck: Supple,    Musculoskeletal: No deformity   General: The patient is awake, alert and appears in no acute distress. The patient is well groomed. HEENT: Neck:  Supple. Cardiovascular: low BP-  Regular rate and rhythm , without murmurs, rubs or carotid bruit. She has a mitral valve click- likely prolaps related.  Extremities:  There is no pitting edema in the distal lower extremities bilaterally.  Pedal pulses are intact. Skin:  Bluish at the feet. Raynauds.  Warm & dry without trophic changes noted.   Musculoskeletal:  Exam reveals no joint deformities, tenderness, swelling or erythema.   Neurologic exam : Mental Status:  The patient is awake, alert, oriented to all 4 spheres.  Speech is clear with normal prosody & enunciation.  Thought process is linear.  Cranial nerves: Normocephalic, atraumatic.  Face is symmetric with no facial masking.  Pupils are equal, round & reactive to light & accomodation.   Extraocular tracking shows no nystagmus .  Hearing grossly intact.  Speech is clear and not  hypophonic with no dysarthria noted.    Oropharynx exam reveals: Tongue protrudes centrally & palate elevates symmetrically.  Tonsils are not present.  In addition, shoulder shrug is normal with equal shoulder height noted. Motor exam: Normal tone and normal muscle bulk and symmetric strength in all extremities.  No resting tremor, no postural or action tremor.   Reflexes are 2+ throughout. Fine Motor Testing: Normal hand movements. Unchanged handwriting.  Cerebellar testing:  No dysmetria or intention tremor on finger-to-nose testing.   There is no truncal or gait ataxia. Sensory:  Fine touch, vibration were normal in all extremities.     DIAGNOSTIC DATA (LABS, IMAGING, TESTING): Other Results from 08/21/2017   Result Notes for Comprehensive metabolic panel   MS patient on Rebif , MS diagnosed October 2008. Migraine is well controlled.   The Patient suffered from endometrial cancer in 2009, had no  recurrence. She was never on hormone replacement therapy.  She took hormonal contraceptives in her 65s.   ASSESSMENT AND PLAN This cooperative and pleasant 74 year -old female has a past medical history of Migraine;; and Multiple sclerosis ([place]) initialy relapsing= remitting, now without relapse for many years. . here to follow-up. She has not had exacerbation of MS symptoms since 2018 .   Imitrex is used acutely for migraines.  Patient does not wish to be on a preventive medication.    Please note that I reviewed the actual images with the patient and there are vascular small vessel disease changes and some small lesions of demyelinating origin( pontine , right cortical u -fibers affected.)  PLAN: Last MRI was 09-14-2018, no repeat needed unless clinical changes are noted - if MRI bran and cervical spine are unchanged again, consider d/c REBIF.   I will ask Dr Felecia Shelling to read the MRI for [place].   Continue sumatriptan when necessary acute migraine  Exercise for overall health, healthy diet Follow-up in 6 months with either me or Np.  Call for any exacerbation of MS symptoms   Larey Seat, MD  [facility] [address], [place], [place] [address] [phone]

## 2020-03-09 ENCOUNTER — Encounter: Payer: Self-pay | Admitting: Neurology

## 2020-03-09 LAB — COMPREHENSIVE METABOLIC PANEL
ALT: 22 IU/L (ref 0–32)
AST: 29 IU/L (ref 0–40)
Albumin/Globulin Ratio: 2.2 (ref 1.2–2.2)
Albumin: 4 g/dL (ref 3.7–4.7)
Alkaline Phosphatase: 84 IU/L (ref 48–121)
BUN/Creatinine Ratio: 19 (ref 12–28)
BUN: 16 mg/dL (ref 8–27)
Bilirubin Total: 0.6 mg/dL (ref 0.0–1.2)
CO2: 26 mmol/L (ref 20–29)
Calcium: 9.2 mg/dL (ref 8.7–10.3)
Chloride: 103 mmol/L (ref 96–106)
Creatinine, Ser: 0.86 mg/dL (ref 0.57–1.00)
GFR calc Af Amer: 79 mL/min/{1.73_m2} (ref >59)
GFR calc non Af Amer: 68 mL/min/{1.73_m2} (ref >59)
Globulin, Total: 1.8 g/dL (ref 1.5–4.5)
Glucose: 103 mg/dL — ABNORMAL HIGH (ref 65–99)
Potassium: 3.7 mmol/L (ref 3.5–5.2)
Sodium: 141 mmol/L (ref 134–144)
Total Protein: 5.8 g/dL — ABNORMAL LOW (ref 6.0–8.5)

## 2020-03-09 NOTE — Progress Notes (Signed)
Non fasting glucose is not valid. Good results for MRI contrast study. Please make sure Dr Felecia Shelling will read the images.

## 2020-04-01 ENCOUNTER — Other Ambulatory Visit: Payer: Self-pay

## 2020-04-01 ENCOUNTER — Ambulatory Visit
Admission: RE | Admit: 2020-04-01 | Discharge: 2020-04-01 | Disposition: A | Discharge status: Home / Self Care | Payer: Medicare Other | Source: Ambulatory Visit | Attending: Neurology | Admitting: Neurology

## 2020-04-01 DIAGNOSIS — G35 Multiple sclerosis: Secondary | ICD-10-CM

## 2020-04-01 DIAGNOSIS — G43909 Migraine, unspecified, not intractable, without status migrainosus: Secondary | ICD-10-CM

## 2020-04-01 DIAGNOSIS — Z79899 Other long term (current) drug therapy: Secondary | ICD-10-CM

## 2020-04-01 MED ORDER — GADOBENATE DIMEGLUMINE 529 MG/ML IV SOLN
10.0000 mL | Freq: Once | INTRAVENOUS | Status: AC | PRN
  Administered 2020-04-01: 10 mL via INTRAVENOUS

## 2020-04-03 NOTE — Progress Notes (Signed)
Confirming MS related lesions in the brain, but no acute, new lesions.    Multiple T2/FLAIR hyperintense foci in the hemispheres and pons in a pattern and configuration consistent with chronic demyelinating plaque associated with multiple sclerosis.  None of the foci appears to be acute.  They do not enhance.  Compared to the MRI from 09/14/2018, there are no new lesions. 2.    There is a normal enhancement pattern and no acute findings.

## 2020-04-03 NOTE — Progress Notes (Signed)
IMPRESSION: This MRI of the cervical spine with and without contrast shows the following: 1.   The spinal cord appears normal. 2.   Mild disc degenerative changes at C4-C5 to C6-C7 that do not lead to nerve root compression or spinal stenosis.  The mild degenerative changes at C4-C5 and C5-C6 are unchanged compared to the 2008 MRI.  There has been progression at C6-C7. 3.   There is a normal enhancement pattern.   INTERPRETING PHYSICIAN:  Richard A. Felecia Shelling, MD, PhD, FAAN Certified in  Neuroimaging by [employer] Exam Ended: 04/01/20 17:13   Key message - no MS lesions in the spinal cord !

## 2020-04-05 ENCOUNTER — Telehealth: Payer: Self-pay | Admitting: *Deleted

## 2020-04-05 NOTE — Telephone Encounter (Signed)
I spoke to the patient and she verbalized understanding of the MRI findings below.

## 2020-04-05 NOTE — Telephone Encounter (Signed)
I spoke to the patient who verbalized understanding of her MRI cervical findings.   Reports having three episodes of positional dizziness, especially from lying down. States she historically has a blood pressure that runs on the lower end of normal. She has not had any further issues but wanted this noted. I instructed her to make sure she is staying well hydrated. Also, to change positions slowly to help avoid injury. If it continues to be problematic, she is going to schedule an appointment with her PCP for further evaluation.

## 2020-04-05 NOTE — Telephone Encounter (Signed)
-----   Message from Larey Seat, MD sent at 04/03/2020  1:32 PM EDT ----- Confirming MS related lesions in the brain, but no acute, new lesions.    Multiple T2/FLAIR hyperintense foci in the hemispheres and pons in a pattern and configuration consistent with chronic demyelinating plaque associated with multiple sclerosis.  None of the foci appears to be acute.  They do not enhance.  Compared to the MRI from 09/14/2018, there are no new lesions. 2.    There is a normal enhancement pattern and no acute findings.

## 2020-04-05 NOTE — Telephone Encounter (Signed)
-----   Message from Larey Seat, MD sent at 04/03/2020 11:58 AM EDT ----- IMPRESSION: This MRI of the cervical spine with and without contrast shows the following: 1.   The spinal cord appears normal. 2.   Mild disc degenerative changes at C4-C5 to C6-C7 that do not lead to nerve root compression or spinal stenosis.  The mild degenerative changes at C4-C5 and C5-C6 are unchanged compared to the 2008 MRI.  There has been progression at C6-C7. 3.   There is a normal enhancement pattern.   INTERPRETING PHYSICIAN:  Richard A. Felecia Shelling, MD, PhD, FAAN Certified in  Neuroimaging by [employer] Exam Ended: 04/01/20 17:13   Key message - no MS lesions in the spinal cord !

## 2020-05-31 ENCOUNTER — Other Ambulatory Visit: Payer: Self-pay | Admitting: Neurology

## 2020-06-08 ENCOUNTER — Encounter: Payer: Self-pay | Admitting: Neurology

## 2020-06-08 ENCOUNTER — Ambulatory Visit: Payer: Medicare Other | Admitting: Neurology

## 2020-06-08 ENCOUNTER — Telehealth: Payer: Self-pay | Admitting: Neurology

## 2020-06-08 VITALS — BP 89/52 | HR 67 | Ht 64.5 in | Wt 109.0 lb

## 2020-06-08 DIAGNOSIS — Z5181 Encounter for therapeutic drug level monitoring: Secondary | ICD-10-CM

## 2020-06-08 DIAGNOSIS — G35 Multiple sclerosis: Secondary | ICD-10-CM | POA: Diagnosis not present

## 2020-06-08 DIAGNOSIS — Z79899 Other long term (current) drug therapy: Secondary | ICD-10-CM

## 2020-06-08 DIAGNOSIS — G43909 Migraine, unspecified, not intractable, without status migrainosus: Secondary | ICD-10-CM

## 2020-06-08 DIAGNOSIS — C541 Malignant neoplasm of endometrium: Secondary | ICD-10-CM

## 2020-06-08 NOTE — Telephone Encounter (Signed)
[facility] medicare order sent to [facility]. No auth they will reach out tot he patient to schedule.  

## 2020-06-08 NOTE — Progress Notes (Signed)
[facility]  PATIENT: [name] DOB: August 23, 1945   REASON FOR VISIT: Follow-up for multiple sclerosis, migraine HISTORY FROM: Patient    HISTORY OF PRESENT ILLNESS;  CD: 06-08-2020: I am happy to see [name] today and meanwhile 74 year old Caucasian female patient diagnosed in the year 2008 with MS.  At the time her very first MRI was diagnostic.  Since then she has had a slight increase in the overall white matter lesion burden in comparison to 2008 study in 2014 documented this rather mild progression.  She had done another MRI versus in 2020, and or neuroradiologist commented on a lesion was in the pons that was previously not documented.  The only clinical relapse sign was in 2018 and since then there have been no progressions.  We compared the MRI from 04-02-20 to the January 2020 study and there was no progression noted.  I feel confident that the patient had no clinical silent progression for at least 2 years based on these 2 studies and that we could discuss the discontinuation of rebirth and also should follow with another MRI if either symptoms recur that could indicate a relapse.  Or just in 6 months to make sure there is no clinical silent progression without symptoms noticeable to the patient.    : [name], a 74 year old retired , married female returns for followup.She was last seen 07/03/15 and has been followed for multiple sclerosis since October 2008. She has had 2 migraines in the past month, worse during allergy season  She is on Imitrex with good results. She does not want to be on a preventative medication . She has associated photophobia, she used to have a visual aura of Zig zag lines and lights , now it is more of a tunnel vision arising from the periphery centrally with tiny little light dots.The headaches last with a pounding throbbing character about 3 hours up to a whole day . She is fatigued afterwards. She is  highly sensitive to scents and smells.  She is currently on Rebif tolerating the medication without significant injection reactions. She does not pretreat. She denies spasms, focal weakness, sensory changes, visual changes, speech or swallowing problems, no problems with bowel or bladder function. She has not fallen, she does not use an assistive device.  Last MRI of the brain 07/02/13 read as abnormal - MRI scan of brain showing multilple periventricular and supratentorial white matter lesions consistent with multiple sclerosis. No enhancing lesions are noted. The presence of T 1 black holes indicates chronic disease. Overall mild increase in white matter lesion load compared to MRI 05/24/2007. She returns for reevaluation   08/20/16 [name], a 74 year old  married female returns for follow up,  has been followed for multiple sclerosis since October 2008.  She has had 2 migraines in the past  3 month, worse during allergy season,   She ison Imitrex with good results. She does not want to be on a preventative medication . She has associated photophobia, she used to have a visual aura of Zig zag lines and lights , now it is more of a tunnel vision arising from the periphery centrally with tiny little light dots.The headaches last with a pounding throbbing character about 3 hours up to a whole da . She is fatigued afterwards. She is highly sensitive to scents and smells.  She is currently on Rebif tolerating the medication without significant injection reactions. She does not pretreat. She denies spasms, focal weakness,  sensory changes, visual changes, speech or swallowing problems, no problems with bowel or bladder function. She has not fallen, she does not use an assistive device.  Last MRI of the brain 07/02/13 read as abnormal - MRI scan of brain showing multilple periventricular and supratentorial white matter lesions consistent with multiple sclerosis. No enhancing lesions are  noted. The presence of T 1 black holes indicates chronic disease. Overall mild increase in white matter lesion load compared to MRI 05/24/2007. She returns for reevaluation. No MRI since 2014 and no clinical relapse signs.   UPDATE UPDATE 1/10/2019[name], 74 year old female returns for follow-up with a history of multiple sclerosis since October 2008.  She also has a history of migraines for which she uses Imitrex.  She has had more headaches recently due to the holidays however she is not interested in being on a preventive medication.  She awoke with a headache this morning took some Tylenol then later Imitrex with relief of her headache.  Her headaches have a pounding character and she can have a visual aura of zigzag lines and lights.  She remains on rebif  for relapsing remitting multiple sclerosis.  She denies injection site issues.  She denies any weakness sensory changes visual changes bowel or bladder problems,.  She is fairly active.  Last MRI of 2014 and she has not had any clinical relapse signs.  She is not interested in having an MRI.  She returns for reevaluation UPDATE 7/11/2019[name], 74 year old female returns for follow-up with history of multiple sclerosis since 2008.  She also has a history of migraines for which she uses Imitrex.  Her headaches are too infrequent to be on a preventive medication.  She is also on rebif relapsing remitting multiple sclerosis without injection site issues.  She denies any new visual changes sensory changes bowel or bladder problems weakness she remains fairly active.  Last MRI of the brain in 2014 and she has not had any clinical relapse.  She has no interest in having another MRI.  She returns for reevaluation   Update 03-02-2019, [name], a retired 74 year old Caucasian married female has been active in her church, and has lost mother and mother in-law in recent years, of both she was taking care. She has been doing well  since 2008 on REBIF injections and continues .  She has currently less Migraine, she calls her friends. She mets outside, but mises contact.  She drinks tea occasionally. A coffee in AM- and she is not taking sodas, alcohol, and is not a tobaco user.   RV on 03-08-2020. MS patient since 2008 followed at [facility]- 74 year- old. Regular exerciser, moderate diet. Healthy looking.  Patient has rarely Migraines now, visual aura is tunnel vision- last one yesterday, but aura did not evolve into the full headaches.  This is a routine RV.      ALLERGIES: Allergies  Allergen Reactions  . Penicillins Rash    As a child. Has patient had a PCN reaction causing immediate rash, facial/tongue/throat swelling, SOB or lightheadedness with hypotension: No Has patient had a PCN reaction causing severe rash involving mucus membranes or skin necrosis: No Has patient had a PCN reaction that required hospitalization: No Has patient had a PCN reaction occurring within the last 10 years: No If all of the above answers are "NO", then may proceed with Cephalosporin use.   . Codeine Nausea And Vomiting    HOME MEDICATIONS: Outpatient Medications Prior to Visit  Medication Sig Dispense Refill  . acetaminophen (TYLENOL) 500 MG tablet Take 500-1,000 mg by mouth every 6 (six) hours as needed.    . Calcium Carbonate (CALCIUM 500 PO) Take 2 tablets by mouth daily.    . COLLAGEN PO Take 1 tablet by mouth daily.    [name] latanoprost (XALATAN) 0.005 % ophthalmic solution Place 1 drop into both eyes at bedtime.  6  . levothyroxine (SYNTHROID, LEVOTHROID) 88 MCG tablet Take 88 mcg by mouth See admin instructions. Takes before breakfast Monday through Friday only.    . Multiple Vitamin (MULTIVITAMIN WITH MINERALS) TABS tablet Take 1 tablet by mouth daily.    [name] REBIF 44 MCG/0.5ML SOSY injection Inject  28mcg Sub-q 3 times weekly Rotate site after each injection 18 mL 0  . SUMAtriptan (IMITREX) 100 MG tablet TAKE 1 TABLET BY MOUTH  AS NEEDED FOR HEADACHE MAY REPEAT IN 2 HOURS IF HEADACHE PERSIST 10 tablet 3   No facility-administered medications prior to visit.    PAST MEDICAL HISTORY: Past Medical History:  Diagnosis Date  . Endometrial cancer ([place])   . Family history of adverse reaction to anesthesia    mother ponv  . Migraine   . Multiple sclerosis ([place])     PAST SURGICAL HISTORY: Past Surgical History:  Procedure Laterality Date  . cataracts Bilateral   . COLONOSCOPY WITH PROPOFOL N/A 12/11/2015   Procedure: COLONOSCOPY WITH PROPOFOL;  Surgeon: Garlan Fair, MD;  Location: [facility];  Service: Endoscopy;  Laterality: N/A;  . salzman nodules  12/2014   Had one eye then the other on 01/2015  . TONSILLECTOMY    . VAGINAL HYSTERECTOMY     complete    FAMILY HISTORY: Family History  Problem Relation Age of Onset  . Stroke Mother   . Congestive Heart Failure Mother   . Migraines Sister     SOCIAL HISTORY: Social History   Socioeconomic History  . Marital status: Married    Spouse name: [name]  . Number of children: 1  . Years of education: 63  . Highest education level: Not on file  Occupational History    Employer: [employer]  Tobacco Use  . Smoking status: Never Smoker  . Smokeless tobacco: Never Used  Substance and Sexual Activity  . Alcohol use: Yes    Alcohol/week: 1.0 standard drink    Types: 1 Glasses of wine per week    Comment: wine on occ  . Drug use: No  . Sexual activity: Not on file  Other Topics Concern  . 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   Patient has one child.   Patient is retired.   Patient has a college education.   Patient is right-handed.   Patient drinks very little caffeine.   Social Determinants of Health   Financial Resource Strain:   . Difficulty of Paying Living Expenses: Not on file  Food Insecurity:   . Worried About Charity fundraiser in the Last Year: Not on file  . Ran Out of Food  in the Last Year: Not on file  Transportation Needs:   . Lack of Transportation (Medical): Not on file  . Lack of Transportation (Non-Medical): Not on file  Physical Activity:   . Days of Exercise per Week: Not on file  . Minutes of Exercise per Session: Not on file  Stress:   . Feeling of Stress : Not on file  Social Connections:   .  Frequency of Communication with Friends and Family: Not on file  . Frequency of Social Gatherings with Friends and Family: Not on file  . Attends Religious Services: Not on file  . Active Member of Clubs or Organizations: Not on file  . Attends Archivist Meetings: Not on file  . Marital Status: Not on file  Intimate Partner Violence:   . Fear of Current or Ex-Partner: Not on file  . Emotionally Abused: Not on file  . Physically Abused: Not on file  . Sexually Abused: Not on file     PHYSICAL EXAM  Vitals:   06/08/20 1320  BP: (!) 89/52  Pulse: 67  Weight: 109 lb (49.4 kg)  Height: 5' 4.5" (1.638 m)   Body mass index is 18.42 kg/m. Generalized: Well developed, in no acute distress  Head: normocephalic and atraumatic. Oropharynx benign  Neck: Supple,  Musculoskeletal: No deformity   General: The patient is awake, alert and appears in no acute distress. The patient is well groomed. HEENT: Neck:  Supple. Cardiovascular: low BP-  Regular rate and rhythm , without murmurs, rubs or carotid bruit. She has a mitral valve click- likely prolaps related.  Extremities:  There is no pitting edema in the distal lower extremities bilaterally.  Pedal pulses are intact. Skin:  Bluish at the feet. Raynauds.  Warm & dry without trophic changes noted.   Musculoskeletal:  Exam reveals no joint deformities, tenderness, swelling or erythema.   Neurologic exam : Mental Status:  The patient is awake, alert, oriented to all 4 spheres.  Speech is clear with normal prosody & enunciation.  Thought process is linear.  Cranial nerves: no loss of smell or  taste- Covid booster given on 05-15-2020.   Face is symmetric with no facial masking.  Pupils are equal, round & reactive to light & accomodation.   Extraocular tracking shows no nystagmus. Hearing grossly intact.  Speech is clear and not  hypophonic with no dysarthria noted.    In addition, shoulder shrug is normal with equal shoulder height noted. Motor exam: Normal tone and normal muscle bulk and symmetric strength in all extremities.  No resting tremor, no postural or action tremor.   Reflexes are 2+ throughout. Fine Motor Testing: Normal hand movements. Unchanged handwriting.  Cerebellar testing:  No dysmetria or intention tremor on finger-to-nose testing.   There is no truncal or gait ataxia. No change in penmanship.  Sensory:  Fine touch, vibration were normal in all extremities.   DIAGNOSTIC DATA (LABS, IMAGING, TESTING): MRI Results from 08/21/2018- compared to 02-2020.    Result Notes for Comprehensive metabolic panel   MS patient on Rebif , MS diagnosed October 2008. Migraine is well controlled.   The Patient suffered from endometrial cancer in 2009, had no recurrence. She was never on hormone replacement therapy.  She took hormonal contraceptives in her 31s.   ASSESSMENT AND PLAN This cooperative and pleasant 74 year -old female has a past medical history of Migraine;; and Multiple sclerosis ([place]) initialy relapsing= remitting, now without relapse for many years- had some increase in white matter lesions between 2008 and 2014- and in 2020 a pontine lesion was identified, not present in 2014- the p last MRI was "cold' no changes in the interval since 08-2018.   here to follow-up an psossibly d/c MS medication- . She has not had exacerbation of MS symptoms since 2018 .   Imitrex is used acutely for migraines.  Patient does not wish to be on a preventive  medication.    Please note that I reviewed the actual images with the patient and there are vascular small vessel disease  changes and some small lesions of demyelinating origin( pontine , right cortical u -fibers affected.) No changes were seen in the last MRI of this year - in comparison to 08/2018.  Will safely d/c rebif.  Continue sumatriptan when necessary acute migraine  Exercise for overall health, healthy diet Call for any exacerbation of MS symptoms   Larey Seat, MD  [facility] [address] [place], [place] [address] [phone]

## 2020-06-09 LAB — CBC WITH DIFFERENTIAL/PLATELET
Basophils Absolute: 0 10*3/uL (ref 0.0–0.2)
Basos: 1 %
EOS (ABSOLUTE): 0.2 10*3/uL (ref 0.0–0.4)
Eos: 3 %
Hematocrit: 39.6 % (ref 34.0–46.6)
Hemoglobin: 13.6 g/dL (ref 11.1–15.9)
Immature Grans (Abs): 0 10*3/uL (ref 0.0–0.1)
Immature Granulocytes: 0 %
Lymphocytes Absolute: 1.6 10*3/uL (ref 0.7–3.1)
Lymphs: 28 %
MCH: 33.9 pg — ABNORMAL HIGH (ref 26.6–33.0)
MCHC: 34.3 g/dL (ref 31.5–35.7)
MCV: 99 fL — ABNORMAL HIGH (ref 79–97)
Monocytes Absolute: 0.5 10*3/uL (ref 0.1–0.9)
Monocytes: 9 %
Neutrophils Absolute: 3.3 10*3/uL (ref 1.4–7.0)
Neutrophils: 59 %
Platelets: 213 10*3/uL (ref 150–450)
RBC: 4.01 x10E6/uL (ref 3.77–5.28)
RDW: 12.4 % (ref 11.7–15.4)
WBC: 5.6 10*3/uL (ref 3.4–10.8)

## 2020-06-09 LAB — COMPREHENSIVE METABOLIC PANEL
ALT: 16 IU/L (ref 0–32)
AST: 25 IU/L (ref 0–40)
Albumin/Globulin Ratio: 2 (ref 1.2–2.2)
Albumin: 4.5 g/dL (ref 3.7–4.7)
Alkaline Phosphatase: 88 IU/L (ref 44–121)
BUN/Creatinine Ratio: 28 (ref 12–28)
BUN: 21 mg/dL (ref 8–27)
Bilirubin Total: 0.6 mg/dL (ref 0.0–1.2)
CO2: 28 mmol/L (ref 20–29)
Calcium: 9.6 mg/dL (ref 8.7–10.3)
Chloride: 102 mmol/L (ref 96–106)
Creatinine, Ser: 0.75 mg/dL (ref 0.57–1.00)
GFR calc Af Amer: 93 mL/min/{1.73_m2} (ref >59)
GFR calc non Af Amer: 80 mL/min/{1.73_m2} (ref >59)
Globulin, Total: 2.2 g/dL (ref 1.5–4.5)
Glucose: 92 mg/dL (ref 65–99)
Potassium: 3.9 mmol/L (ref 3.5–5.2)
Sodium: 143 mmol/L (ref 134–144)
Total Protein: 6.7 g/dL (ref 6.0–8.5)

## 2020-06-09 NOTE — Progress Notes (Signed)
Consistently elevated MCV and MCH, compared with 10 years of data since being on MS therapy. No other abnormalities.

## 2020-06-12 ENCOUNTER — Telehealth: Payer: Self-pay | Admitting: Neurology

## 2020-06-12 NOTE — Telephone Encounter (Signed)
I have called the patient and verified. She is to have the MRI's completed in 6 months and then she will follow up after the MRI's

## 2020-06-12 NOTE — Telephone Encounter (Signed)
-----   Message from Larey Seat, MD sent at 06/09/2020 11:27 AM EDT ----- Consistently elevated MCV and MCH, compared with 10 years of data since being on MS therapy. No other abnormalities.

## 2020-06-12 NOTE — Telephone Encounter (Signed)
I just want to verify the patient is to have her MRI scheduled in 6 months?

## 2020-06-12 NOTE — Telephone Encounter (Signed)
Called the patient and reviewed her lab results with her. She verbalized understanding and had no further questions.

## 2020-07-05 ENCOUNTER — Other Ambulatory Visit: Payer: Self-pay | Admitting: Internal Medicine

## 2020-07-05 DIAGNOSIS — Z1231 Encounter for screening mammogram for malignant neoplasm of breast: Secondary | ICD-10-CM

## 2020-07-25 ENCOUNTER — Ambulatory Visit
Admission: RE | Admit: 2020-07-25 | Discharge: 2020-07-25 | Disposition: A | Discharge status: Home / Self Care | Payer: Medicare Other | Source: Ambulatory Visit | Attending: Internal Medicine | Admitting: Internal Medicine

## 2020-07-25 ENCOUNTER — Other Ambulatory Visit: Payer: Self-pay

## 2020-07-25 DIAGNOSIS — Z1231 Encounter for screening mammogram for malignant neoplasm of breast: Secondary | ICD-10-CM

## 2020-08-17 DIAGNOSIS — H401231 Low-tension glaucoma, bilateral, mild stage: Secondary | ICD-10-CM | POA: Diagnosis not present

## 2020-08-17 DIAGNOSIS — Z961 Presence of intraocular lens: Secondary | ICD-10-CM | POA: Diagnosis not present

## 2020-08-17 DIAGNOSIS — H04123 Dry eye syndrome of bilateral lacrimal glands: Secondary | ICD-10-CM | POA: Diagnosis not present

## 2020-09-11 DIAGNOSIS — Z0000 Encounter for general adult medical examination without abnormal findings: Secondary | ICD-10-CM | POA: Diagnosis not present

## 2020-09-11 DIAGNOSIS — Z7189 Other specified counseling: Secondary | ICD-10-CM | POA: Diagnosis not present

## 2020-09-11 DIAGNOSIS — G35 Multiple sclerosis: Secondary | ICD-10-CM | POA: Diagnosis not present

## 2020-09-11 DIAGNOSIS — E039 Hypothyroidism, unspecified: Secondary | ICD-10-CM | POA: Diagnosis not present

## 2020-09-11 DIAGNOSIS — I341 Nonrheumatic mitral (valve) prolapse: Secondary | ICD-10-CM | POA: Diagnosis not present

## 2020-09-11 DIAGNOSIS — J301 Allergic rhinitis due to pollen: Secondary | ICD-10-CM | POA: Diagnosis not present

## 2020-09-11 DIAGNOSIS — Z1389 Encounter for screening for other disorder: Secondary | ICD-10-CM | POA: Diagnosis not present

## 2020-09-11 DIAGNOSIS — E559 Vitamin D deficiency, unspecified: Secondary | ICD-10-CM | POA: Diagnosis not present

## 2020-11-14 ENCOUNTER — Other Ambulatory Visit: Payer: Self-pay

## 2020-11-14 ENCOUNTER — Ambulatory Visit
Admission: RE | Admit: 2020-11-14 | Discharge: 2020-11-14 | Disposition: A | Discharge status: Home / Self Care | Payer: Medicare Other | Source: Ambulatory Visit | Attending: Neurology | Admitting: Neurology

## 2020-11-14 DIAGNOSIS — Z79899 Other long term (current) drug therapy: Secondary | ICD-10-CM

## 2020-11-14 DIAGNOSIS — G35 Multiple sclerosis: Secondary | ICD-10-CM

## 2020-11-14 DIAGNOSIS — G43909 Migraine, unspecified, not intractable, without status migrainosus: Secondary | ICD-10-CM

## 2020-11-14 MED ORDER — GADOBENATE DIMEGLUMINE 529 MG/ML IV SOLN
10.0000 mL | Freq: Once | INTRAVENOUS | Status: AC | PRN
  Administered 2020-11-14: 10 mL via INTRAVENOUS

## 2020-11-17 NOTE — Progress Notes (Signed)
IMPRESSION:   MRI brain with and without contrast demonstrating: - Stable supratentorial chronic demyelinating plaques.   - No acute plaques.  No change from 04/01/2020.   INTERPRETING PHYSICIAN:  Penni Bombard, MD

## 2020-11-21 ENCOUNTER — Telehealth: Payer: Self-pay | Admitting: Neurology

## 2020-11-21 NOTE — Telephone Encounter (Signed)
Called the pt and advised the MRI findings were reviewed and Dr Dohmeier felt that there was no new changes from previous imaging. Pt verbalized understanding. Pt had no questions at this time but was encouraged to call back if questions arise.

## 2020-11-21 NOTE — Telephone Encounter (Signed)
-----   Message from Larey Seat, MD sent at 11/17/2020 11:43 AM EDT ----- IMPRESSION:   MRI brain with and without contrast demonstrating: - Stable supratentorial chronic demyelinating plaques.   - No acute plaques.  No change from 04/01/2020.   INTERPRETING PHYSICIAN:  Penni Bombard, MD

## 2020-12-07 ENCOUNTER — Ambulatory Visit: Payer: Medicare Other | Admitting: Neurology

## 2021-01-11 ENCOUNTER — Other Ambulatory Visit: Payer: Self-pay

## 2021-01-11 ENCOUNTER — Encounter: Payer: Self-pay | Admitting: Adult Health

## 2021-01-11 ENCOUNTER — Ambulatory Visit: Payer: Medicare Other | Admitting: Adult Health

## 2021-01-11 VITALS — BP 96/61 | HR 90 | Ht 64.0 in | Wt 109.0 lb

## 2021-01-11 DIAGNOSIS — G35 Multiple sclerosis: Secondary | ICD-10-CM | POA: Diagnosis not present

## 2021-01-11 DIAGNOSIS — G43909 Migraine, unspecified, not intractable, without status migrainosus: Secondary | ICD-10-CM

## 2021-01-11 MED ORDER — SUMATRIPTAN SUCCINATE 100 MG PO TABS: ORAL_TABLET | ORAL | 3 refills | Status: DC

## 2021-01-11 NOTE — Progress Notes (Signed)
PATIENT: [name] DOB: [birth year]  REASON FOR VISIT: follow up HISTORY FROM: patient  HISTORY OF PRESENT ILLNESS: Today 01/11/21:  Ms. [name] is a 75 year old female with a history of migraine headaches and multiple sclerosis.  She returns today for follow-up.  The patient is no longer on any disease modifying therapy.  She states overall she has been stable.  Had repeat imaging of the brain in April that was stable.  She states that she has noticed some tingling in the left hand and on the right side of the scalp in the temporal region.  She states that this occurs on occasion but is not consistent.  She denies any changes with her gait or balance.  No changes with the bowels or bladder.  No changes with her vision.  She states that her migraines have been stable.  She states that they typically wake her up out of sleep and this is new for her.  She states that she has not had a migraine in 2 months.  She continues to use Imitrex if needed.  HISTORY (Copied from Dr.Dohmeier's note) Ms. [name], a 75 year old retired , married female returns for followup.She was last seen 07/03/15 and has been followed for multiple sclerosis since October 2008. She has had 2 migraines in the past month, worse during allergy season  She is on Imitrex with good results. She does not want to be on a preventative medication . She has associated photophobia, she used to have a visual aura of Zig zag lines and lights , now it is more of a tunnel vision arising from the periphery centrally with tiny little light dots.The headaches last with a pounding throbbing character about 3 hours up to a whole day . She is fatigued afterwards. She is highly sensitive to scents and smells.  She is currently on Rebif tolerating the medication without significant injection reactions. She does not pretreat. She denies spasms, focal weakness, sensory changes, visual changes, speech or swallowing problems, no  problems with bowel or bladder function. She has not fallen, she does not use an assistive device.  Last MRI of the brain 07/02/13 read as abnormal - MRI scan of brain showing multilple periventricular and supratentorial white matter lesions consistent with multiple sclerosis. No enhancing lesions are noted. The presence of T 1 black holes indicates chronic disease. Overall mild increase in white matter lesion load compared to MRI 05/24/2007. She returns for reevaluation  REVIEW OF SYSTEMS: Out of a complete 14 system review of symptoms, the patient complains only of the following symptoms, and all other reviewed systems are negative.  See HPI  ALLERGIES: Allergies  Allergen Reactions  . Penicillins Rash    As a child. Has patient had a PCN reaction causing immediate rash, facial/tongue/throat swelling, SOB or lightheadedness with hypotension: No Has patient had a PCN reaction causing severe rash involving mucus membranes or skin necrosis: No Has patient had a PCN reaction that required hospitalization: No Has patient had a PCN reaction occurring within the last 10 years: No If all of the above answers are "NO", then may proceed with Cephalosporin use.   . Codeine Nausea And Vomiting    HOME MEDICATIONS: Outpatient Medications Prior to Visit  Medication Sig Dispense Refill  . acetaminophen (TYLENOL) 500 MG tablet Take 500-1,000 mg by mouth every 6 (six) hours as needed.    . Calcium Carbonate (CALCIUM 500 PO) Take 2 tablets by mouth daily.    . COLLAGEN PO Take 1  tablet by mouth daily.    [name] latanoprost (XALATAN) 0.005 % ophthalmic solution Place 1 drop into both eyes at bedtime.  6  . levothyroxine (SYNTHROID, LEVOTHROID) 88 MCG tablet Take 88 mcg by mouth See admin instructions. Takes before breakfast Monday through Friday only.    . Multiple Vitamin (MULTIVITAMIN WITH MINERALS) TABS tablet Take 1 tablet by mouth daily.    . SUMAtriptan (IMITREX) 100 MG tablet TAKE 1 TABLET BY  MOUTH AS NEEDED FOR HEADACHE MAY REPEAT IN 2 HOURS IF HEADACHE PERSIST 10 tablet 3  . REBIF 44 MCG/0.5ML SOSY injection Inject  6mcg Sub-q 3 times weekly Rotate site after each injection (Patient not taking: Reported on 01/11/2021) 18 mL 0   No facility-administered medications prior to visit.    PAST MEDICAL HISTORY: Past Medical History:  Diagnosis Date  . Endometrial cancer ([place])   . Family history of adverse reaction to anesthesia    mother ponv  . Migraine   . Multiple sclerosis ([place])     PAST SURGICAL HISTORY: Past Surgical History:  Procedure Laterality Date  . cataracts Bilateral   . COLONOSCOPY WITH PROPOFOL N/A 12/11/2015   Procedure: COLONOSCOPY WITH PROPOFOL;  Surgeon: Garlan Fair, MD;  Location: [facility];  Service: Endoscopy;  Laterality: N/A;  . salzman nodules  12/2014   Had one eye then the other on 01/2015  . TONSILLECTOMY    . VAGINAL HYSTERECTOMY     complete    FAMILY HISTORY: Family History  Problem Relation Age of Onset  . Stroke Mother   . Congestive Heart Failure Mother   . Migraines Sister     SOCIAL HISTORY: Social History   Socioeconomic History  . Marital status: Married    Spouse name: [name]  . Number of children: 1  . Years of education: 63  . Highest education level: Not on file  Occupational History    Employer: [employer]  Tobacco Use  . Smoking status: Never Smoker  . Smokeless tobacco: Never Used  Substance and Sexual Activity  . Alcohol use: Yes    Alcohol/week: 1.0 standard drink    Types: 1 Glasses of wine per week    Comment: wine on occ  . Drug use: No  . Sexual activity: Not on file  Other Topics Concern  . 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   Patient has one child.   Patient is retired.   Patient has a college education.   Patient is right-handed.   Patient drinks very little caffeine.   Social Determinants of Health   Financial Resource Strain:  Not on file  Food Insecurity: Not on file  Transportation Needs: Not on file  Physical Activity: Not on file  Stress: Not on file  Social Connections: Not on file  Intimate Partner Violence: Not on file      PHYSICAL EXAM  Vitals:   01/11/21 1057  BP: 96/61  Pulse: 90  Weight: 109 lb (49.4 kg)  Height: 5\' 4"  (1.626 m)   Body mass index is 18.71 kg/m.  Generalized: Well developed, in no acute distress   Neurological examination  Mentation: Alert oriented to time, place, history taking. Follows all commands speech and language fluent Cranial nerve II-XII: Pupils were equal round reactive to light. Extraocular movements were full, visual field were full on confrontational test. Facial sensation and strength were normal. Uvula tongue midline. Head turning and shoulder shrug  were normal  and symmetric. Motor: The motor testing reveals 5 over 5 strength of all 4 extremities. Good symmetric motor tone is noted throughout.  Sensory: Sensory testing is intact to soft touch on all 4 extremities. No evidence of extinction is noted.  Coordination: Cerebellar testing reveals good finger-nose-finger and heel-to-shin bilaterally.  Gait and station: Gait is normal.  Reflexes: Deep tendon reflexes are symmetric and normal bilaterally.   DIAGNOSTIC DATA (LABS, IMAGING, TESTING) - I reviewed patient records, labs, notes, testing and imaging myself where available.  Lab Results  Component Value Date   WBC 5.6 06/08/2020   HGB 13.6 06/08/2020   HCT 39.6 06/08/2020   MCV 99 (H) 06/08/2020   PLT 213 06/08/2020      Component Value Date/Time   NA 143 06/08/2020 1404   K 3.9 06/08/2020 1404   CL 102 06/08/2020 1404   CO2 28 06/08/2020 1404   GLUCOSE 92 06/08/2020 1404   GLUCOSE 91 05/28/2007 0410   BUN 21 06/08/2020 1404   CREATININE 0.75 06/08/2020 1404   CALCIUM 9.6 06/08/2020 1404   PROT 6.7 06/08/2020 1404   ALBUMIN 4.5 06/08/2020 1404   AST 25 06/08/2020 1404   ALT 16  06/08/2020 1404   ALKPHOS 88 06/08/2020 1404   BILITOT 0.6 06/08/2020 1404   GFRNONAA 80 06/08/2020 1404   GFRAA 93 06/08/2020 1404   Lab Results  Component Value Date   CHOL  05/25/2007    196        ATP III CLASSIFICATION:  <200     mg/dL   Desirable  200-239  mg/dL   Borderline High  >=240    mg/dL   High   HDL 80 05/25/2007   LDLCALC (H) 05/25/2007    112        Total Cholesterol/HDL:CHD Risk Coronary Heart Disease Risk Table                     Men   Women  1/2 Average Risk   3.4   3.3   TRIG 22 05/25/2007   CHOLHDL 2.5 05/25/2007   No results found for: HGBA1C No results found for: VITAMINB12     ASSESSMENT AND PLAN 75 y.o. year old female  has a past medical history of Endometrial cancer ([place]), Family history of adverse reaction to anesthesia, Migraine, and Multiple sclerosis ([place]). here with:  1.  Multiple sclerosis  --Continue to monitor symptoms -- We will consider repeating imaging at the next visit  2.  Migraine headaches  --Continue Imitrex as abortive therapy -- Advised if headache frequency or severity increases she should let us know  --Follow-up in 1 year or sooner if needed  Ward Givens, MSN, NP-C 01/11/2021, 11:03 AM [facility] [address], [place], [place] [address] [phone]

## 2021-01-11 NOTE — Patient Instructions (Signed)
Your Plan:  Continue Imitrex If your symptoms worsen or you develop new symptoms please let us know.   Thank you for coming to see [place] at [facility]. I hope we have been able to provide you high quality care today.  You may receive a patient satisfaction survey over the next few weeks. We would appreciate your feedback and comments so that we may continue to improve ourselves and the health of our patients.

## 2021-01-17 DIAGNOSIS — K5790 Diverticulosis of intestine, part unspecified, without perforation or abscess without bleeding: Secondary | ICD-10-CM | POA: Diagnosis not present

## 2021-01-17 DIAGNOSIS — R197 Diarrhea, unspecified: Secondary | ICD-10-CM | POA: Diagnosis not present

## 2021-01-17 DIAGNOSIS — R319 Hematuria, unspecified: Secondary | ICD-10-CM | POA: Diagnosis not present

## 2021-02-20 DIAGNOSIS — H401231 Low-tension glaucoma, bilateral, mild stage: Secondary | ICD-10-CM | POA: Diagnosis not present

## 2021-02-20 DIAGNOSIS — H04123 Dry eye syndrome of bilateral lacrimal glands: Secondary | ICD-10-CM | POA: Diagnosis not present

## 2021-02-20 DIAGNOSIS — Z961 Presence of intraocular lens: Secondary | ICD-10-CM | POA: Diagnosis not present

## 2021-02-27 DIAGNOSIS — M85859 Other specified disorders of bone density and structure, unspecified thigh: Secondary | ICD-10-CM | POA: Diagnosis not present

## 2021-02-27 DIAGNOSIS — D6859 Other primary thrombophilia: Secondary | ICD-10-CM | POA: Diagnosis not present

## 2021-02-27 DIAGNOSIS — I499 Cardiac arrhythmia, unspecified: Secondary | ICD-10-CM | POA: Diagnosis not present

## 2021-02-27 DIAGNOSIS — Z1389 Encounter for screening for other disorder: Secondary | ICD-10-CM | POA: Diagnosis not present

## 2021-02-27 DIAGNOSIS — Z1231 Encounter for screening mammogram for malignant neoplasm of breast: Secondary | ICD-10-CM | POA: Diagnosis not present

## 2021-02-27 DIAGNOSIS — E559 Vitamin D deficiency, unspecified: Secondary | ICD-10-CM | POA: Diagnosis not present

## 2021-02-27 DIAGNOSIS — R0600 Dyspnea, unspecified: Secondary | ICD-10-CM | POA: Diagnosis not present

## 2021-02-27 DIAGNOSIS — I480 Paroxysmal atrial fibrillation: Secondary | ICD-10-CM | POA: Diagnosis not present

## 2021-02-27 DIAGNOSIS — Z0000 Encounter for general adult medical examination without abnormal findings: Secondary | ICD-10-CM | POA: Diagnosis not present

## 2021-02-27 DIAGNOSIS — E039 Hypothyroidism, unspecified: Secondary | ICD-10-CM | POA: Diagnosis not present

## 2021-02-27 DIAGNOSIS — K5792 Diverticulitis of intestine, part unspecified, without perforation or abscess without bleeding: Secondary | ICD-10-CM | POA: Diagnosis not present

## 2021-02-27 DIAGNOSIS — R197 Diarrhea, unspecified: Secondary | ICD-10-CM | POA: Diagnosis not present

## 2021-02-27 DIAGNOSIS — Z1211 Encounter for screening for malignant neoplasm of colon: Secondary | ICD-10-CM | POA: Diagnosis not present

## 2021-03-27 DIAGNOSIS — M85859 Other specified disorders of bone density and structure, unspecified thigh: Secondary | ICD-10-CM | POA: Diagnosis not present

## 2021-03-27 DIAGNOSIS — D6859 Other primary thrombophilia: Secondary | ICD-10-CM | POA: Diagnosis not present

## 2021-03-27 DIAGNOSIS — I480 Paroxysmal atrial fibrillation: Secondary | ICD-10-CM | POA: Diagnosis not present

## 2021-03-27 DIAGNOSIS — E039 Hypothyroidism, unspecified: Secondary | ICD-10-CM | POA: Diagnosis not present

## 2021-03-29 ENCOUNTER — Other Ambulatory Visit: Payer: Self-pay | Admitting: Internal Medicine

## 2021-03-29 DIAGNOSIS — M8580 Other specified disorders of bone density and structure, unspecified site: Secondary | ICD-10-CM

## 2021-05-02 ENCOUNTER — Encounter: Payer: Self-pay | Admitting: Cardiology

## 2021-05-02 ENCOUNTER — Ambulatory Visit (INDEPENDENT_AMBULATORY_CARE_PROVIDER_SITE_OTHER): Payer: Medicare Other

## 2021-05-02 ENCOUNTER — Other Ambulatory Visit: Payer: Self-pay

## 2021-05-02 ENCOUNTER — Ambulatory Visit: Payer: Medicare Other | Admitting: Cardiology

## 2021-05-02 DIAGNOSIS — I4891 Unspecified atrial fibrillation: Secondary | ICD-10-CM | POA: Diagnosis not present

## 2021-05-02 DIAGNOSIS — Z8679 Personal history of other diseases of the circulatory system: Secondary | ICD-10-CM | POA: Diagnosis not present

## 2021-05-02 DIAGNOSIS — R5383 Other fatigue: Secondary | ICD-10-CM | POA: Diagnosis not present

## 2021-05-02 DIAGNOSIS — I4819 Other persistent atrial fibrillation: Secondary | ICD-10-CM

## 2021-05-02 HISTORY — DX: Other persistent atrial fibrillation: I48.19

## 2021-05-02 NOTE — Progress Notes (Unsigned)
Enrolled patient for a 14 day Zio XT  monitor to be mailed to patients home  °

## 2021-05-02 NOTE — Patient Instructions (Signed)
Medication Instructions:   No changes  *If you need a refill on your cardiac medications before your next appointment, please call your pharmacy*   Lab Work:  Not needed   Testing/Procedures: Will be schedule at [place] has requested that you have an echocardiogram. Echocardiography is a painless test that uses sound waves to create images of your heart. It provides your doctor with information about the size and shape of your heart and how well your heart's chambers and valves are working. This procedure takes approximately one hour. There are no restrictions for this procedure.     Will be mailed to your home   Your physician has recommended that you wear a holter monitor 14 days ZIO. Holter monitors are medical devices that record the heart's electrical activity. Doctors most often use these monitors to diagnose arrhythmias. Arrhythmias are problems with the speed or rhythm of the heartbeat. The monitor is a small, portable device. You can wear one while you do your normal daily activities. This is usually used to diagnose what is causing palpitations/syncope (passing out).    Follow-Up: At [facility], you and your health needs are our priority.  As part of our continuing mission to provide you with exceptional heart care, we have created designated Provider Care Teams.  These Care Teams include your primary Cardiologist (physician) and Advanced Practice Providers (APPs -  Physician Assistants and Nurse Practitioners) who all work together to provide you with the care you need, when you need it.     Your next appointment:   6 to 8 week(s)  The format for your next appointment:   In Person  Provider:   Glenetta Hew, MD   Other Instructions  ZIO XT- Long Term Monitor Instructions  Your physician has requested you wear a ZIO patch monitor for 14 days.  This is a single patch monitor. [employer] supplies one patch monitor per  enrollment. Additional stickers are not available. Please do not apply patch if you will be having a Nuclear Stress Test,  Echocardiogram, Cardiac CT, MRI, or Chest Xray during the period you would be wearing the  monitor. The patch cannot be worn during these tests. You cannot remove and re-apply the  ZIO XT patch monitor.  Your ZIO patch monitor will be mailed 3 day USPS to your address on file. It may take 3-5 days  to receive your monitor after you have been enrolled.  Once you have received your monitor, please review the enclosed instructions. Your monitor  has already been registered assigning a specific monitor serial # to you.  Billing and Patient Assistance Program Information  We have supplied [employer] with any of your insurance information on file for billing purposes. [employer] offers a sliding scale Patient Assistance Program for patients that do not have  insurance, or whose insurance does not completely cover the cost of the ZIO monitor.  You must apply for the Patient Assistance Program to qualify for this discounted rate.  To apply, please call [employer] at [phone], select option 4, select option 2, ask to apply for  Patient Assistance Program. Theodore Demark will ask your household income, and how many people  are in your household. They will quote your out-of-pocket cost based on that information.  [employer] will also be able to set up a 38-month, interest-free payment plan if needed.  Applying the monitor   Shave hair from upper left chest.  Hold abrader disc by orange tab. Rub abrader  in 40 strokes over the upper left chest as  indicated in your monitor instructions.  Clean area with 4 enclosed alcohol pads. Let dry.  Apply patch as indicated in monitor instructions. Patch will be placed under collarbone on left  side of chest with arrow pointing upward.  Rub patch adhesive wings for 2 minutes. Remove white label marked "1". Remove the white  label marked "2". Rub patch  adhesive wings for 2 additional minutes.  While looking in a mirror, press and release button in center of patch. A small green light will  flash 3-4 times. This will be your only indicator that the monitor has been turned on.  Do not shower for the first 24 hours. You may shower after the first 24 hours.  Press the button if you feel a symptom. You will hear a small click. Record Date, Time and  Symptom in the Patient Logbook.  When you are ready to remove the patch, follow instructions on the last 2 pages of Patient  Logbook. Stick patch monitor onto the last page of Patient Logbook.  Place Patient Logbook in the blue and white box. Use locking tab on box and tape box closed  securely. The blue and white box has prepaid postage on it. Please place it in the mailbox as  soon as possible. Your physician should have your test results approximately 7 days after the  monitor has been mailed back to [facility].  Call [facility] at [phone] if you have questions regarding  your ZIO XT patch monitor. Call them immediately if you see an orange light blinking on your  monitor.  If your monitor falls off in less than 4 days, contact our Monitor department at [phone].  If your monitor becomes loose or falls off after 4 days call [employer] at [phone] for  suggestions on securing your monitor

## 2021-05-02 NOTE — Progress Notes (Signed)
Primary Care Provider: Leeroy Cha, MD Cardiologist: Glenetta Hew, MD Neurologist: Larey Seat, MD.  Vaughan Browner, FNP [facility] neurology) Ophthalmology: Dr. Ellin Mayhew Electrophysiologist: None  Clinic Note: Chief Complaint  Patient presents with   New Patient (Initial Visit)   Atrial Fibrillation    Recent diagnosis of A. fib; started on Eliquis   ===================================  ASSESSMENT/PLAN   Problem List Items Addressed This Visit       Cardiology Problems   Atrial fibrillation ([place]) (Chronic)    New diagnosis.  Seems to be pretty persistent since she probably was in it when she saw the PCP and is still in it now.  This patients CHA2DS2-VASc Score and unadjusted Ischemic Stroke Rate (% per year) is equal to 2.2 % stroke rate/year from a score of 2  Above score calculated as 1 point each if present [CHF, HTN, DM, Vascular=MI/PAD/Aortic Plaque, Age if 65-74, or Female] Above score calculated as 2 points each if present [Age > 75, or Stroke/TIA/TE]   Plan: Will check 2D echocardiogram for structural abnormality especially in light of history of MVP and soft murmur on exam. If abnormal, would probably consider ischemic evaluation.  For now, in the absence of any chest pain or pressure, will hold off. 14-day Zio patch monitor to assess A. fib burden Continue Eliquis.  Rate seems to be controlled without rate control.  Will hold off for now      Relevant Medications   apixaban (ELIQUIS) 5 MG TABS tablet   Other Relevant Orders   EKG 12-Lead (Completed)   ECHOCARDIOGRAM COMPLETE   LONG TERM MONITOR (3-14 DAYS)     Other   Fatigue    I suspect that she may be a little more tired than usual because she is in A. fib.  We can assess her burden and EF.  If there is any abnormalities there or if she has significant amount of A. fib, would probably consider an ischemic evaluation so that we could consider antiarrhythmics.  With fatigue, would be  reluctant to add AV nodal agent unless rates get faster.      Relevant Orders   EKG 12-Lead (Completed)   ECHOCARDIOGRAM COMPLETE   LONG TERM MONITOR (3-14 DAYS)   History of mitral valve prolapse in adulthood    By report, she had mitral prolapse diagnosed in 1994.  Has not had a formal evaluation that I can tell. I do not hear anything to suggest mitral prolapse on exam besides a soft murmur.  Plan: Check 2D echocardiogram.       ===================================  HPI:    [name] is a 75 y.o. female with a PMH notable for Multiple Sclerosis (well controlled currently), hypothyroidism (on Synthroid), Occasional Migraine Headaches and new diagnosis of Atrial Fibrillation who is being seen today for the evaluation of New Diagnosis of ATRIAL FIBRILLATION (associated with dizziness for few months) at the request of Fara Olden, Rupashree,*.  [name] was last seen on on February 27, 2021 by her PCP for parotic wellness exam.  She noted intermittent dizziness and palpitations but no syncope or near syncope.  She notes that her heart had a regular heartbeat.  Occasional noted exertional dyspnea, and fatigue.  No chest pain or pressure.  No edema. => EKG showed A. fib with a rate of 109 bpm. Started on Eliquis 5 mg twice daily.  Recent Hospitalizations: None  Reviewed  CV studies:    The following studies were reviewed today: (if available, images/films reviewed: From  Epic Chart or Care Everywhere) No previous studies available:   Interval History:   [name] presents for evaluation of atrial fibrillation that is a new diagnosis for her.  She says that only if she pays attention will she noted her heart rate feels little unusual or fast-usually this is when she is lying down at night.  She also will say that it feels little bit faster as if it is fluttering when she is walking.  She actually says that this usually gets better when she is active  and worse at rest.  No exertional or resting chest pain or pressure. She really does not get short of breath when she walks around, just has little more fatigued than usual.  Maybe some exercise intolerance.  Not doing as much of the physical activity that she used to.  She used to work in the yard doing yard work several hours a day, now does not do nearly as much.  She also notes that she has had easy fatigability and feeling tired.  When that happens, she is more likely to feel the palpitations and other associated dizziness etc.  CV Review of Symptoms (Summary) Cardiovascular ROS: positive for - dyspnea on exertion, irregular heartbeat, palpitations, rapid heart rate, and fatigue, decreased energy and exercise intolerance. negative for - chest pain, orthopnea, paroxysmal nocturnal dyspnea, shortness of breath, or syncope/near syncope or TIA/amaurosis fugax, claudication  REVIEWED OF SYSTEMS   Review of Systems  Constitutional:  Positive for malaise/fatigue (Has noted somewhat reduced energy levels in the last month or 2.  Not doing as much of the yard work or other activities.). Negative for weight loss.  HENT:  Negative for congestion and nosebleeds.   Respiratory:  Positive for shortness of breath (Maybe a little more than usual on exertion.  Also occasionally has to take deep breaths to catch her breath.).   Cardiovascular:  Positive for palpitations.  Gastrointestinal:  Negative for blood in stool, melena and nausea.  Genitourinary:  Negative for hematuria.  Musculoskeletal:  Positive for joint pain (Minimal).  Neurological:  Positive for dizziness (Off and on). Negative for weakness.  Endo/Heme/Allergies:  Does not bruise/bleed easily.  Psychiatric/Behavioral:  Negative for depression and memory loss. The patient is nervous/anxious. The patient does not have insomnia.    I have reviewed and (if needed) personally updated the patient's problem list, medications, allergies, past medical  and surgical history, social and family history.   PAST MEDICAL HISTORY   Past Medical History:  Diagnosis Date   Diverticulosis 2012   With intermittent diverticulitis   Endometrial cancer ([place]) 2009   S/p vaginal hysterectomy   Family history of adverse reaction to anesthesia    mother ponv   Glaucoma    History of mitral valve prolapse 1994   Diagnosis result of murmur.  (Not confirmed by recent echo)   Hypothyroidism due to thyroiditis treated with radioactive iodine    Migraine    Multiple sclerosis ([place]) 2008   Well-controlled, currently not on medications.  Repatha subcu 3 times weekly from 2008-2021.  No further relapse.    PAST SURGICAL HISTORY   Past Surgical History:  Procedure Laterality Date   cataracts Bilateral    COLONOSCOPY WITH PROPOFOL N/A 12/11/2015   Procedure: COLONOSCOPY WITH PROPOFOL;  Surgeon: Garlan Fair, MD;  Location: [facility];  Service: Endoscopy;  Laterality: N/A;   salzman nodules  12/2014   Had one eye then the other on 01/2015   TONSILLECTOMY  VAGINAL HYSTERECTOMY     complete    Immunization History  Administered Date(s) Administered   Influenza-Unspecified 06/17/2014   PFIZER(Purple Top)SARS-COV-2 Vaccination 09/22/2019, 10/13/2019, 05/08/2020    MEDICATIONS/ALLERGIES   Current Meds  Medication Sig   acetaminophen (TYLENOL) 500 MG tablet Take 500-1,000 mg by mouth every 6 (six) hours as needed.   apixaban (ELIQUIS) 5 MG TABS tablet    Calcium Carbonate (CALCIUM 500 PO) Take 2 tablets by mouth daily.   COLLAGEN PO Take 1 tablet by mouth daily.   latanoprost (XALATAN) 0.005 % ophthalmic solution Place 1 drop into both eyes at bedtime.   levothyroxine (SYNTHROID, LEVOTHROID) 88 MCG tablet Take 88 mcg by mouth See admin instructions. Takes before breakfast Monday through Friday only.   Multiple Vitamin (MULTIVITAMIN WITH MINERALS) TABS tablet Take 1 tablet by mouth daily.   SUMAtriptan (IMITREX) 100 MG tablet TAKE 1 TABLET BY  MOUTH AS NEEDED FOR HEADACHE MAY REPEAT IN 2 HOURS IF HEADACHE PERSIST    Allergies  Allergen Reactions   Penicillins Rash    As a child. Has patient had a PCN reaction causing immediate rash, facial/tongue/throat swelling, SOB or lightheadedness with hypotension: No Has patient had a PCN reaction causing severe rash involving mucus membranes or skin necrosis: No Has patient had a PCN reaction that required hospitalization: No Has patient had a PCN reaction occurring within the last 10 years: No If all of the above answers are "NO", then may proceed with Cephalosporin use.    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   Patient has one child -daughter.>    Patient is retired.   Patient has a college education.   Patient is right-handed.   Patient drinks very little caffeine.   Exercise at the gym 2 to 3 days a week, walks routinely and does intermittent weights.   Family History  Problem Relation Age of Onset   Stroke Mother 23       lived 36 yrs   Congestive Heart Failure Mother 106   Fainting Father    Pneumonia Father        Thought to be aspiration pneumonia following an episode of syncope.   Migraines Sister       OBJCTIVE -PE, EKG, labs   Wt Readings from Last 3 Encounters:  05/02/21 109 lb 6.4 oz (49.6 kg)  01/11/21 109 lb (49.4 kg)  06/08/20 109 lb (49.4 kg)    Physical Exam: BP 115/60   Pulse 88   Ht 5\' 4"  (1.626 m)   Wt 109 lb 6.4 oz (49.6 kg)   SpO2 97%   BMI 18.78 kg/m heart rate ranged from 88-100 bpm Physical Exam Vitals reviewed.   Constitutional:      General: She is not in acute distress.    Appearance: Normal appearance. She is normal weight. She is not ill-appearing or toxic-appearing.     Comments: Pleasant, healthy-appearing.  Well-groomed.  HENT:     Head: Normocephalic and atraumatic.  Ears:     Comments: Mild hard of hearing Eyes:     Extraocular Movements: Extraocular movements intact.     Pupils: Pupils are equal, round, and reactive to light.  Neck:     Vascular: No carotid bruit or JVD.  Cardiovascular:     Rate and Rhythm: Normal rate. Rhythm irregularly irregular. No extrasystoles are present.    Chest Wall: PMI is not displaced.     Pulses: Normal pulses.     Heart sounds: S1 normal and S2 normal. Murmur (Cannot exclude soft systolic murmur.) heard.    No gallop. No S4 sounds.  Pulmonary:     Effort: Pulmonary effort is normal. No respiratory distress.     Breath sounds: Normal breath sounds. No wheezing, rhonchi or rales.  Abdominal:     General: Abdomen is flat. Bowel sounds are normal. There is no distension.     Palpations: Abdomen is soft. There is no mass (No HSM or bruit.).     Tenderness: There is no abdominal tenderness.  Musculoskeletal:        General: No swelling. Normal range of motion.     Cervical back: Normal range of motion and neck supple.  Skin:    General: Skin is warm and dry.  Neurological:     General: No focal deficit present.     Mental Status: She is alert and oriented to person, place, and time.     Motor: No weakness.     Gait: Gait normal.  Psychiatric:        Mood and Affect: Mood normal.        Behavior: Behavior normal.        Thought Content: Thought content normal.        Judgment: Judgment normal.     Adult ECG Report  Rate: 88 ;  Rhythm: atrial fibrillation and possible anterior infarct, age-indeterminate (poor R wave progression in precordial leads) ; otherwise normal axis, intervals and durations.  Borderline low voltage.  Narrative  Interpretation: Confirmed to be in A. fib.  Recent Labs:  reviewed 02/28/2021 Na+ 142, K+ 4.0, Cl- 103, HCO3-32, BUN 19, Cr 0.94, Glu 93, Ca2+ 10; AST 30, ALT 23, AlkP 94; ALB 4.5.  T Prot 6.8.  T bili 0.8. CBC: W 5.0, H/H 14.0/41.4 (MCH 33.1), Plt 188; TSH 1.52; vitamin D 56.3.  Troponin T 11.  CPK 50.  Lab Results  Component Value Date   CREATININE 0.75 06/08/2020   BUN 21 06/08/2020   NA 143 06/08/2020   K 3.9 06/08/2020   CL 102 06/08/2020   CO2 28 06/08/2020   CBC Latest Ref Rng & Units 06/08/2020 03/02/2019 08/21/2017  WBC 3.4 - 10.8 x10E3/uL 5.6 4.8 5.3  Hemoglobin 11.1 - 15.9 g/dL 13.6 14.1 14.1  Hematocrit 34.0 - 46.6 % 39.6 42.1 41.6  Platelets 150 - 450 x10E3/uL 213 200 226    No results found for: HGBA1C  ==================================================  COVID-19 Education: The signs and symptoms of COVID-19 were discussed with the patient and how to seek care for testing (follow up with PCP or arrange E-visit).    I spent a total of 28 minutes with the patient spent in direct patient consultation.  Additional time spent with chart review  / charting (studies, outside notes, etc): 33 min Total Time: 61 min  Current medicines are reviewed at length with the patient today.  (+/- concerns) N/A  This visit occurred during the SARS-CoV-2 public health emergency.  Safety protocols were in  place, including screening questions prior to the visit, additional usage of staff PPE, and extensive cleaning of exam room while observing appropriate contact time as indicated for disinfecting solutions.  Notice: This dictation was prepared with Dragon dictation along with smart phrase technology. Any transcriptional errors that result from this process are unintentional and may not be corrected upon review.  Patient Instructions / Medication Changes & Studies & Tests Ordered   Patient Instructions  Medication Instructions:   No changes  *If you need a refill on your cardiac  medications before your next appointment, please call your pharmacy*   Lab Work:  Not needed   Testing/Procedures: Will be schedule at [place] has requested that you have an echocardiogram. Echocardiography is a painless test that uses sound waves to create images of your heart. It provides your doctor with information about the size and shape of your heart and how well your heart's chambers and valves are working. This procedure takes approximately one hour. There are no restrictions for this procedure.     Will be mailed to your home   Your physician has recommended that you wear a holter monitor 14 days ZIO. Holter monitors are medical devices that record the heart's electrical activity. Doctors most often use these monitors to diagnose arrhythmias. Arrhythmias are problems with the speed or rhythm of the heartbeat. The monitor is a small, portable device. You can wear one while you do your normal daily activities. This is usually used to diagnose what is causing palpitations/syncope (passing out).    Follow-Up: At [facility], you and your health needs are our priority.  As part of our continuing mission to provide you with exceptional heart care, we have created designated Provider Care Teams.  These Care Teams include your primary Cardiologist (physician) and Advanced Practice Providers (APPs -  Physician Assistants and Nurse Practitioners) who all work together to provide you with the care you need, when you need it.     Your next appointment:   6 to 8 week(s)  The format for your next appointment:   In Person  Provider:   Glenetta Hew, MD   Other Instructions  ZIO XT- Long Term Monitor Instructions  Your physician has requested you wear a ZIO patch monitor for 14 days.  This is a single patch monitor. [employer] supplies one patch monitor per enrollment. Additional stickers are not available. Please do not apply patch if you  will be having a Nuclear Stress Test,  Echocardiogram, Cardiac CT, MRI, or Chest Xray during the period you would be wearing the  monitor. The patch cannot be worn during these tests. You cannot remove and re-apply the  ZIO XT patch monitor.  Your ZIO patch monitor will be mailed 3 day USPS to your address on file. It may take 3-5 days  to receive your monitor after you have been enrolled.  Once you have received your monitor, please review the enclosed instructions. Your monitor  has already been registered assigning a specific monitor serial # to you.  Billing and Patient Assistance Program Information  We have supplied [employer] with any of your insurance information on file for billing purposes. [employer] offers a sliding scale Patient Assistance Program for patients that do not have  insurance, or whose insurance does not completely cover the cost of the ZIO monitor.  You must apply for the Patient Assistance Program to qualify for this discounted rate.  To apply, please call [employer] at  [phone], select option 4, select option 2, ask to apply for  Patient Assistance Program. Theodore Demark will ask your household income, and how many people  are in your household. They will quote your out-of-pocket cost based on that information.  [employer] will also be able to set up a 31-month, interest-free payment plan if needed.  Applying the monitor   Shave hair from upper left chest.  Hold abrader disc by orange tab. Rub abrader in 40 strokes over the upper left chest as  indicated in your monitor instructions.  Clean area with 4 enclosed alcohol pads. Let dry.  Apply patch as indicated in monitor instructions. Patch will be placed under collarbone on left  side of chest with arrow pointing upward.  Rub patch adhesive wings for 2 minutes. Remove white label marked "1". Remove the white  label marked "2". Rub patch adhesive wings for 2 additional minutes.  While looking in a mirror, press and release  button in center of patch. A small green light will  flash 3-4 times. This will be your only indicator that the monitor has been turned on.  Do not shower for the first 24 hours. You may shower after the first 24 hours.  Press the button if you feel a symptom. You will hear a small click. Record Date, Time and  Symptom in the Patient Logbook.  When you are ready to remove the patch, follow instructions on the last 2 pages of Patient  Logbook. Stick patch monitor onto the last page of Patient Logbook.  Place Patient Logbook in the blue and white box. Use locking tab on box and tape box closed  securely. The blue and white box has prepaid postage on it. Please place it in the mailbox as  soon as possible. Your physician should have your test results approximately 7 days after the  monitor has been mailed back to [facility].  Call [place] at [phone] if you have questions regarding  your ZIO XT patch monitor. Call them immediately if you see an orange light blinking on your  monitor.  If your monitor falls off in less than 4 days, contact our Monitor department at [phone].  If your monitor becomes loose or falls off after 4 days call [employer] at [phone] for  suggestions on securing your monitor    Studies Ordered:   Orders Placed This Encounter  Procedures   LONG TERM MONITOR (3-14 DAYS)   EKG 12-Lead   ECHOCARDIOGRAM COMPLETE     Glenetta Hew, M.D., M.S. Interventional Cardiologist   Pager # [phone] Phone # [phone] [address] [place], [place] [address]   Thank you for choosing Heartcare at [facility]!!

## 2021-05-06 ENCOUNTER — Encounter: Payer: Self-pay | Admitting: Cardiology

## 2021-05-06 DIAGNOSIS — Z8679 Personal history of other diseases of the circulatory system: Secondary | ICD-10-CM | POA: Insufficient documentation

## 2021-05-06 DIAGNOSIS — R5383 Other fatigue: Secondary | ICD-10-CM | POA: Diagnosis not present

## 2021-05-06 DIAGNOSIS — I4891 Unspecified atrial fibrillation: Secondary | ICD-10-CM | POA: Diagnosis not present

## 2021-05-06 NOTE — Assessment & Plan Note (Addendum)
New diagnosis.  Seems to be pretty persistent since she probably was in it when she saw the PCP and is still in it now.  This patients CHA2DS2-VASc Score and unadjusted Ischemic Stroke Rate (% per year) is equal to 2.2 % stroke rate/year from a score of 2  Above score calculated as 1 point each if present [CHF, HTN, DM, Vascular=MI/PAD/Aortic Plaque, Age if 65-74, or Female] Above score calculated as 2 points each if present [Age > 75, or Stroke/TIA/TE]   Plan:  Will check 2D echocardiogram for structural abnormality especially in light of history of MVP and soft murmur on exam.  If abnormal, would probably consider ischemic evaluation.  For now, in the absence of any chest pain or pressure, will hold off.  14-day Zio patch monitor to assess A. fib burden  Continue Eliquis.  Rate seems to be controlled without rate control.  Will hold off for now

## 2021-05-06 NOTE — Assessment & Plan Note (Signed)
By report, she had mitral prolapse diagnosed in 1994.  Has not had a formal evaluation that I can tell. I do not hear anything to suggest mitral prolapse on exam besides a soft murmur.  Plan: Check 2D echocardiogram.

## 2021-05-06 NOTE — Assessment & Plan Note (Signed)
I suspect that she may be a little more tired than usual because she is in A. fib.  We can assess her burden and EF.  If there is any abnormalities there or if she has significant amount of A. fib, would probably consider an ischemic evaluation so that we could consider antiarrhythmics.  With fatigue, would be reluctant to add AV nodal agent unless rates get faster.

## 2021-05-12 HISTORY — PX: OTHER SURGICAL HISTORY: SHX169

## 2021-05-16 ENCOUNTER — Other Ambulatory Visit: Payer: Self-pay

## 2021-05-16 ENCOUNTER — Ambulatory Visit (HOSPITAL_COMMUNITY): Payer: Medicare Other | Attending: Cardiovascular Disease

## 2021-05-16 DIAGNOSIS — I4891 Unspecified atrial fibrillation: Secondary | ICD-10-CM | POA: Insufficient documentation

## 2021-05-16 DIAGNOSIS — R5383 Other fatigue: Secondary | ICD-10-CM | POA: Diagnosis not present

## 2021-05-16 DIAGNOSIS — I428 Other cardiomyopathies: Secondary | ICD-10-CM | POA: Insufficient documentation

## 2021-05-16 DIAGNOSIS — I341 Nonrheumatic mitral (valve) prolapse: Secondary | ICD-10-CM | POA: Insufficient documentation

## 2021-05-16 DIAGNOSIS — I340 Nonrheumatic mitral (valve) insufficiency: Secondary | ICD-10-CM

## 2021-05-16 DIAGNOSIS — I3139 Other pericardial effusion (noninflammatory): Secondary | ICD-10-CM | POA: Insufficient documentation

## 2021-05-16 HISTORY — PX: TRANSTHORACIC ECHOCARDIOGRAM: SHX275

## 2021-05-16 HISTORY — DX: Nonrheumatic mitral (valve) insufficiency: I34.0

## 2021-05-16 HISTORY — DX: Other pericardial effusion (noninflammatory): I31.39

## 2021-05-16 LAB — ECHOCARDIOGRAM COMPLETE
MV M vel: 5.03 m/s
MV Peak grad: 101 mmHg
P 1/2 time: 537 msec
Radius: 0.5 cm
S' Lateral: 2.9 cm

## 2021-05-21 ENCOUNTER — Telehealth: Payer: Self-pay | Admitting: Cardiology

## 2021-05-21 NOTE — Telephone Encounter (Signed)
Patient called regarding her test results. She wanted to know if it was OK for her to wait and be seen until 06/19/21

## 2021-05-21 NOTE — Telephone Encounter (Signed)
Reviewed via mychart, patient called to see if appt needs to be sooner than 06/19/21.  RN  informed patient will contact patient on 05/22/21. Once reviewed with Dr Ellyn Hack . Patient verbalized understanding.

## 2021-05-22 NOTE — Telephone Encounter (Signed)
Probably sooner than that   Mandy Hew, MD 05/22/21   Spoke to patient - appointment schedule for 05/28/21 at 4:30 pm . patient is aware,verbalized understanding.

## 2021-05-25 DIAGNOSIS — I4891 Unspecified atrial fibrillation: Secondary | ICD-10-CM | POA: Diagnosis not present

## 2021-05-25 DIAGNOSIS — R5383 Other fatigue: Secondary | ICD-10-CM | POA: Diagnosis not present

## 2021-05-28 ENCOUNTER — Encounter: Payer: Self-pay | Admitting: Cardiology

## 2021-05-28 ENCOUNTER — Other Ambulatory Visit: Payer: Self-pay

## 2021-05-28 ENCOUNTER — Ambulatory Visit: Payer: Medicare Other | Admitting: Cardiology

## 2021-05-28 VITALS — BP 110/80 | HR 99 | Ht 64.0 in | Wt 109.0 lb

## 2021-05-28 DIAGNOSIS — I4819 Other persistent atrial fibrillation: Secondary | ICD-10-CM

## 2021-05-28 DIAGNOSIS — I420 Dilated cardiomyopathy: Secondary | ICD-10-CM | POA: Diagnosis not present

## 2021-05-28 DIAGNOSIS — I208 Other forms of angina pectoris: Secondary | ICD-10-CM | POA: Insufficient documentation

## 2021-05-28 DIAGNOSIS — R5383 Other fatigue: Secondary | ICD-10-CM

## 2021-05-28 DIAGNOSIS — I341 Nonrheumatic mitral (valve) prolapse: Secondary | ICD-10-CM

## 2021-05-28 DIAGNOSIS — I340 Nonrheumatic mitral (valve) insufficiency: Secondary | ICD-10-CM

## 2021-05-28 DIAGNOSIS — Z7901 Long term (current) use of anticoagulants: Secondary | ICD-10-CM | POA: Diagnosis not present

## 2021-05-28 DIAGNOSIS — I3139 Other pericardial effusion (noninflammatory): Secondary | ICD-10-CM

## 2021-05-28 HISTORY — DX: Dilated cardiomyopathy: I42.0

## 2021-05-28 MED ORDER — METOPROLOL SUCCINATE ER 25 MG PO TB24: 25.0000 mg | ORAL_TABLET | Freq: Every day | ORAL | 2 refills | Status: DC

## 2021-05-28 NOTE — Assessment & Plan Note (Signed)
Essentially persistent A. fib with reduced EF and mitral prolapse. Calculated CHA2DS2-VASc score is 3-4.  Female, age, CHF.  Quite likely has aortic plaque.  Tolerating Eliquis well.  No major issues. Will need to be on a hold 40 hours preop from cath.  We will start the night after cath.

## 2021-05-28 NOTE — Assessment & Plan Note (Signed)
Not sure what the chest pain when lying down is, could be angina decubitus.  With significant reduced EF, need to exclude ischemic CAD.  Plan: Right and Left Heart Catheterization  Start low-dose beta-blocker

## 2021-05-28 NOTE — H&P (View-Only) (Signed)
Primary Care Provider: Leeroy Cha, MD Cardiologist: Glenetta Hew, MD Electrophysiologist: None  Clinic Note: Chief Complaint  Patient presents with   PHQ-9 4 Week Follow-up    Discussed echocardiogram results   Atrial Fibrillation    Monitor results-persistent A. fib   Cardiomyopathy    Echo with EF 25 to 30%.    ===================================  ASSESSMENT/PLAN   Problem List Items Addressed This Visit       Cardiology Problems   Atypical angina [facility])    Not sure what the chest pain when lying down is, could be angina decubitus.  With significant reduced EF, need to exclude ischemic CAD.  Plan: Right and Left Heart Catheterization Start low-dose beta-blocker      Relevant Medications   metoprolol succinate (TOPROL XL) 25 MG 24 hr tablet   Other Relevant Orders   Basic metabolic panel   CBC   Dilated cardiomyopathy ([place]) - Primary (Chronic)    Significantly reduced EF with no regional wall motion abnormality.  Question is which came first the reduced EF versus A. fib.  There is also the mitral prolapse with regurgitation and pericardial effusion.  Need to exclude ischemic CAD and establish baseline evaluation: Right Left Heart Catheterization with Possible PCI Anticipate if nonischemic cardiomyopathy checking cardiac MRI to exclude myocarditis or other type of cardiomyopathy. Need to establish better rate control, but BP precludes use of high doses of beta-blocker. ->  Start Toprol 25 mg daily. May consider referral for atrial fibrillation ablation      Relevant Medications   metoprolol succinate (TOPROL XL) 25 MG 24 hr tablet   Other Relevant Orders   Basic metabolic panel   CBC   Idiopathic pericardial effusion (Chronic)    Interesting that she is got mitral valve prolapse, pericardial effusion with reduced EF.  Need to exclude ischemic cardiomyopathy, but also need to potentially consider myopericarditis as the initial etiologies leading to  A. Fib.  I am reluctant to use colchicine since there are no symptoms, but will need to reassess with least limited echo in a few months.  No sign of tamponade from a symptom standpoint.  Consider checking cardiac MRI pending Results      Relevant Medications   metoprolol succinate (TOPROL XL) 25 MG 24 hr tablet   Persistent atrial fibrillation (HCC) (Chronic)    Pretty much persistent A. fib.  Was in A. fib the entire time she wore the monitor.  I suspect that she is been in A. fib since July.  This could be the etiology of her cardiomyopathy, however with the much regurgitation and pericardial effusion, I think myocarditis or other TAVR cardiomyopathy is also possible.    I am concerned about the chest tightness that she has and dyspnea while lying down.  Consider this possibly angina decubitus.  Need to exclude ischemic cardiomyopathy with reduced EF, now we need to exclude ischemic etiology for A. Fib.  Plan: With heart rates in the 100 270/min range, will start low-dose beta-blocker and low threshold to use amiodarone based on reduced EF to try to have better rate control. Ischemic evaluation with cardiac catheterization-right left heart cath.  Pending results, anticipate discussion with EP for the possibility of atrial fibrillation ablation.      Relevant Medications   metoprolol succinate (TOPROL XL) 25 MG 24 hr tablet   Mitral valve regurgitation due to prolapse of cusp (Chronic)    Unusual finding, at the systolic murmur I was suspecting to be systolic ejection and to  be much regurgitation.  Only moderate regurgitation but there is mitral prolapse.  We will need to follow closely.  I suspect that this is exacerbated by being in A. Fib.  Will need to reassess next year.      Relevant Medications   metoprolol succinate (TOPROL XL) 25 MG 24 hr tablet     Other   Fatigue   Relevant Orders   Basic metabolic panel   CBC   Current use of long term anticoagulation:  CHA2DS2-VASc score 4 (Chronic)    Essentially persistent A. fib with reduced EF and mitral prolapse. Calculated CHA2DS2-VASc score is 3-4.  Female, age, CHF.  Quite likely has aortic plaque.  Tolerating Eliquis well.  No major issues. Will need to be on a hold 40 hours preop from cath.  We will start the night after cath.       ===================================  HPI:    [name] is a 75 y.o. female with a PMH notable for a relatively New Diagnosis of A. fib who presents today for close follow-up.  [name] was seen for initial evaluation on May 02, 2021: She really noted the fast heart rates and irregular heartbeats when laying down at night.  Otherwise not usually during the course of the day when she is active.  Maybe that her heart rate takes a little longer to slow down and has some fluttering when she is walking fast. No exertional chest tightness and not really any significant dyspnea either.  Mostly resting dyspnea.  She does note easy fatigability, feeling tired most of the time, and when she does feel tired, the palpitations seem to recur (pick up).  Recent Hospitalizations: None  Reviewed  CV studies:    The following studies were reviewed today: (if available, images/films reviewed: From Epic Chart or Care Everywhere) 14-Day Zio Patch Monitor: October 2022 100% A. fib rates ranging from 54 to 178 bpm.  Rare PVCs with occasional couplets. 2D Echo 05/16/2021: Severely reduced LV function-EF of 25 to 30%.  Unable assess diastolic dysfunction due to A. fib.  Mild to moderate LA dilation.  Moderate pericardial effusion.  Moderate MR with holosystolic prolapse of the posterior MV.  Normal RVP and PAP   Interval History:   [name] returns here today with her husband to discuss results of her studies.  She really has not noticed any real difference in symptoms.  No real PND or orthopnea.  She just notes that she feels the  irregular heartbeats when she lies down.  She says that sometimes her heart rate goes fast, but it does not last long.  No associated dizziness or wooziness unless it is very fast.  She also occasionally notes some tightness or heaviness in her chest that is usually when lying down, not with exertion.  She actually says she feels better when she gets up to do exercise.  CV Review of Symptoms (Summary) Cardiovascular ROS: positive for - chest pain, dyspnea on exertion, irregular heartbeat, palpitations, rapid heart rate, and shortness of breath negative for - edema, loss of consciousness, orthopnea, paroxysmal nocturnal dyspnea, or Near syncope, TIA/amaurosis fugax or claudication  REVIEWED OF SYSTEMS   Review of Systems  Constitutional:  Positive for malaise/fatigue (Persistent exercise intolerance.  Does not do as well doing yard work, but okay walking without carrying something). Negative for weight loss.  HENT:  Negative for nosebleeds.   Respiratory:  Positive for shortness of breath (Just at baseline-no real worsening..  Brief  little bursts of shortness of breath.). Negative for cough (Nothing sustained).   Cardiovascular:  Negative for claudication and leg swelling.  Gastrointestinal:  Negative for abdominal pain, blood in stool, melena and nausea.  Genitourinary:  Negative for hematuria.  Musculoskeletal:  Positive for joint pain. Negative for myalgias.  Neurological:  Negative for dizziness (Off and on, mostly when she first wakes up.) and weakness.  Psychiatric/Behavioral:  Negative for depression and memory loss. The patient is nervous/anxious. The patient does not have insomnia.    I have reviewed and (if needed) personally updated the patient's problem list, medications, allergies, past medical and surgical history, social and family history.   PAST MEDICAL HISTORY   Past Medical History:  Diagnosis Date   Dilated cardiomyopathy ([place]) 05/28/2021   Diverticulosis 2012   With  intermittent diverticulitis   Endometrial cancer ([place]) 2009   S/p vaginal hysterectomy   Family history of adverse reaction to anesthesia    mother ponv   Glaucoma    Hypothyroidism due to thyroiditis treated with radioactive iodine    Idiopathic pericardial effusion 05/16/2021   Migraine    Mitral valve regurgitation due to prolapse of cusp 05/16/2021   Echo: Moderate MR with holosystolic prolapse of the posterior MV   Multiple sclerosis ([place]) 2008   Well-controlled, currently not on medications.  Repatha subcu 3 times weekly from 2008-2021.  No further relapse.   Persistent atrial fibrillation ([place]) 05/02/2021    PAST SURGICAL HISTORY   Past Surgical History:  Procedure Laterality Date   14-day Zio Patch Monitor  05/2021   October 2022 100% A. fib rates ranging from 54 to 178 bpm.  Rare PVCs with occasional couplets.   cataracts Bilateral    COLONOSCOPY WITH PROPOFOL N/A 12/11/2015   Procedure: COLONOSCOPY WITH PROPOFOL;  Surgeon: Garlan Fair, MD;  Location: [facility] END[facility];  Service: Endoscopy;  Laterality: N/A;   salzman nodules  12/11/2014   Had one eye then the other on 01/2015   TONSILLECTOMY     TRANSTHORACIC ECHOCARDIOGRAM  05/16/2021   Severely reduced LV function-EF of 25 to 30%.  Unable assess diastolic dysfunction due to A. fib.  Mild to moderate LA dilation.  Moderate pericardial effusion.  Moderate MR with holosystolic prolapse of the posterior MV.  Normal RVP and PAP   VAGINAL HYSTERECTOMY     complete    Immunization History  Administered Date(s) Administered   Influenza-Unspecified 06/17/2014   PFIZER(Purple Top)SARS-COV-2 Vaccination 09/22/2019, 10/13/2019, 05/08/2020    MEDICATIONS/ALLERGIES   Current Meds  Medication Sig   acetaminophen (TYLENOL) 500 MG tablet Take 500-1,000 mg by mouth every 6 (six) hours as needed.   apixaban (ELIQUIS) 5 MG TABS tablet    Calcium Carbonate (CALCIUM 500 PO) Take 2 tablets by mouth daily.   COLLAGEN PO Take 1 tablet  by mouth daily.   latanoprost (XALATAN) 0.005 % ophthalmic solution Place 1 drop into both eyes at bedtime.   levothyroxine (SYNTHROID, LEVOTHROID) 88 MCG tablet Take 88 mcg by mouth See admin instructions. Takes before breakfast Monday through Friday only.   metoprolol succinate (TOPROL XL) 25 MG 24 hr tablet Take 1 tablet (25 mg total) by mouth daily.   Multiple Vitamin (MULTIVITAMIN WITH MINERALS) TABS tablet Take 1 tablet by mouth daily.   SUMAtriptan (IMITREX) 100 MG tablet TAKE 1 TABLET BY MOUTH AS NEEDED FOR HEADACHE MAY REPEAT IN 2 HOURS IF HEADACHE PERSIST    Allergies  Allergen Reactions   Penicillins Rash    As a  child. Has patient had a PCN reaction causing immediate rash, facial/tongue/throat swelling, SOB or lightheadedness with hypotension: No Has patient had a PCN reaction causing severe rash involving mucus membranes or skin necrosis: No Has patient had a PCN reaction that required hospitalization: No Has patient had a PCN reaction occurring within the last 10 years: No If all of the above answers are "NO", then may proceed with Cephalosporin use.    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   Patient has one child -daughter.>    Patient is retired.   Patient has a college education.   Patient is right-handed.   Patient drinks very little caffeine.   Exercise at the gym 2 to 3 days a week, walks routinely and does intermittent weights.    OBJCTIVE -PE, EKG, labs   Wt Readings from Last 3 Encounters:  05/28/21 109 lb (49.4 kg)  05/02/21 109 lb 6.4 oz (49.6 kg)   01/11/21 109 lb (49.4 kg)    Physical Exam: BP 110/80 (BP Location: Left Arm, Patient Position: Sitting, Cuff Size: Normal)   Pulse 99   Ht 5\' 4"  (1.626 m)   Wt 109 lb (49.4 kg)   SpO2 100%   BMI 18.71 kg/m  Physical Exam Constitutional:      Appearance: Normal appearance. She is not ill-appearing or toxic-appearing.     Comments: Somewhat thin, but not necessarily frail appearing.  Well-groomed.  No acute distress.  HENT:     Head: Normocephalic and atraumatic.  Eyes:     Extraocular Movements: Extraocular movements intact.  Neck:     Vascular: No carotid bruit or JVD.  Cardiovascular:     Rate and Rhythm: Tachycardia present. Rhythm irregularly irregular.     Chest Wall: PMI is not displaced.     Pulses: Decreased pulses (Decreased with palpable pedal pulses).     Heart sounds: S1 normal and S2 normal. Murmur heard.  High-pitched blowing holosystolic murmur is present with a grade of 1/6 at the apex.    No friction rub.  Musculoskeletal:     Cervical back: Normal range of motion and neck supple.  Neurological:     Mental Status: She is alert.     Adult ECG Report N/a  Recent Labs:   09/09/2019: TC 219, TG 52, HDL 88, LDL 122.  02/26/2021: Hgb 14, Cr 0.94, K+ 4.0.  Lab Results  Component Value Date   CREATININE 0.75 06/08/2020   BUN 21 06/08/2020   NA 143 06/08/2020   K 3.9 06/08/2020   CL 102 06/08/2020   CO2 28 06/08/2020   CBC Latest Ref Rng & Units 06/08/2020 03/02/2019 08/21/2017  WBC 3.4 - 10.8 x10E3/uL 5.6 4.8 5.3  Hemoglobin 11.1 - 15.9 g/dL 13.6 14.1 14.1  Hematocrit 34.0 - 46.6 % 39.6 42.1 41.6  Platelets 150 - 450 x10E3/uL 213 200 226    No results found for: HGBA1C   ==================================================  COVID-19 Education: The signs and symptoms of COVID-19 were discussed with the  patient and how to seek care for testing (follow up with PCP or arrange E-visit).    I spent a total of 50 minutes with the patient spent in direct  patient consultation.  Additional time spent with chart review  / charting (studies, outside notes, etc): 26 min Total Time: 76 min  Current medicines are reviewed at length with the patient today.  (+/- concerns) none  This visit occurred during the SARS-CoV-2 public health emergency.  Safety protocols were in place, including screening questions prior to the visit, additional usage of staff PPE, and extensive cleaning of exam room while observing appropriate contact time as indicated for disinfecting solutions.  Notice: This dictation was prepared with Dragon dictation along with smart phrase technology. Any transcriptional errors that result from this process are unintentional and may not be corrected upon review.  Patient Instructions / Medication Changes & Studies & Tests Ordered   Shared Decision Making/Informed ConsentThe risks [stroke (1 in 1000), death (1 in 9), kidney failure [usually temporary] (1 in 500), bleeding (1 in 200), allergic reaction [possibly serious] (1 in 200)], benefits (diagnostic support and management of coronary artery disease) and alternatives of a cardiac catheterization were discussed in detail with Ms. Cocuzza and she is willing to proceed.   Patient Instructions  Medication Instructions:   Toprol 25 mg one tablet daily   *If you need a refill on your cardiac medications before your next appointment, please call your pharmacy*   Lab Work: BMP CBC  If you have labs (blood work) drawn today and your tests are completely normal, you will receive your results only by: [place] (if you have MyChart) OR A paper copy in the mail If you have any lab test that is abnormal or we need to change your treatment, we will call you to review the results.   Testing/Procedures:   Will be  schedule at Jun 07, 2021 at [place] has requested that you have a cardiac catheterization. Cardiac catheterization is used to  diagnose and/or treat various heart conditions. Doctors may recommend this procedure for a number of different reasons. The most common reason is to evaluate chest pain. Chest pain can be a symptom of coronary artery disease (CAD), and cardiac catheterization can show whether plaque is narrowing or blocking your heart's arteries. This procedure is also used to evaluate the valves, as well as measure the blood flow and oxygen levels in different parts of your heart. For further information please visit [email or web address]. Please follow instruction sheet, as given.   Follow-Up: At [facility], you and your health needs are our priority.  As part of our continuing mission to provide you with exceptional heart care, we have created designated Provider Care Teams.  These Care Teams include your primary Cardiologist (physician) and Advanced Practice Providers (APPs -  Physician Assistants and Nurse Practitioners) who all work together to provide you with the care you need, when you need it.     Your next appointment:   Keep appointment on  Jun 19, 2021   The format for your next appointment:   In Person  Provider:   Glenetta Hew, MD   Other Instructions    [address] [place] [address] Dept: [phone] Loc: [facility]  05/28/2021  You are scheduled for a Cardiac Catheterization on Thursday, October 27 with Dr. Glenetta Hew.   Studies Ordered:  Orders Placed This Encounter  Procedures   Basic metabolic panel   CBC      Glenetta Hew, M.D., M.S. Interventional Cardiologist   Pager # [phone] Phone # [phone] [address] [place], [place] [address]   Thank you for choosing [facility] at [facility]!!

## 2021-05-28 NOTE — Assessment & Plan Note (Signed)
Pretty much persistent A. fib.  Was in A. fib the entire time she wore the monitor.  I suspect that she is been in A. fib since July.  This could be the etiology of her cardiomyopathy, however with the much regurgitation and pericardial effusion, I think myocarditis or other TAVR cardiomyopathy is also possible.    I am concerned about the chest tightness that she has and dyspnea while lying down.  Consider this possibly angina decubitus.  Need to exclude ischemic cardiomyopathy with reduced EF, now we need to exclude ischemic etiology for A. Fib.  Plan:  With heart rates in the 100 270/min range, will start low-dose beta-blocker and low threshold to use amiodarone based on reduced EF to try to have better rate control.  Ischemic evaluation with cardiac catheterization-right left heart cath.  Pending results, anticipate discussion with EP for the possibility of atrial fibrillation ablation.

## 2021-05-28 NOTE — Progress Notes (Signed)
Primary Care Provider: Leeroy Cha, MD Cardiologist: Glenetta Hew, MD Electrophysiologist: None  Clinic Note: Chief Complaint  Patient presents with   PHQ-9 4 Week Follow-up    Discussed echocardiogram results   Atrial Fibrillation    Monitor results-persistent A. fib   Cardiomyopathy    Echo with EF 25 to 30%.    ===================================  ASSESSMENT/PLAN   Problem List Items Addressed This Visit       Cardiology Problems   Atypical angina [facility])    Not sure what the chest pain when lying down is, could be angina decubitus.  With significant reduced EF, need to exclude ischemic CAD.  Plan: Right and Left Heart Catheterization Start low-dose beta-blocker      Relevant Medications   metoprolol succinate (TOPROL XL) 25 MG 24 hr tablet   Other Relevant Orders   Basic metabolic panel   CBC   Dilated cardiomyopathy ([place]) - Primary (Chronic)    Significantly reduced EF with no regional wall motion abnormality.  Question is which came first the reduced EF versus A. fib.  There is also the mitral prolapse with regurgitation and pericardial effusion.  Need to exclude ischemic CAD and establish baseline evaluation: Right Left Heart Catheterization with Possible PCI Anticipate if nonischemic cardiomyopathy checking cardiac MRI to exclude myocarditis or other type of cardiomyopathy. Need to establish better rate control, but BP precludes use of high doses of beta-blocker. ->  Start Toprol 25 mg daily. May consider referral for atrial fibrillation ablation      Relevant Medications   metoprolol succinate (TOPROL XL) 25 MG 24 hr tablet   Other Relevant Orders   Basic metabolic panel   CBC   Idiopathic pericardial effusion (Chronic)    Interesting that she is got mitral valve prolapse, pericardial effusion with reduced EF.  Need to exclude ischemic cardiomyopathy, but also need to potentially consider myopericarditis as the initial etiologies leading to  A. Fib.  I am reluctant to use colchicine since there are no symptoms, but will need to reassess with least limited echo in a few months.  No sign of tamponade from a symptom standpoint.  Consider checking cardiac MRI pending Results      Relevant Medications   metoprolol succinate (TOPROL XL) 25 MG 24 hr tablet   Persistent atrial fibrillation (HCC) (Chronic)    Pretty much persistent A. fib.  Was in A. fib the entire time she wore the monitor.  I suspect that she is been in A. fib since July.  This could be the etiology of her cardiomyopathy, however with the much regurgitation and pericardial effusion, I think myocarditis or other TAVR cardiomyopathy is also possible.    I am concerned about the chest tightness that she has and dyspnea while lying down.  Consider this possibly angina decubitus.  Need to exclude ischemic cardiomyopathy with reduced EF, now we need to exclude ischemic etiology for A. Fib.  Plan: With heart rates in the 100 270/min range, will start low-dose beta-blocker and low threshold to use amiodarone based on reduced EF to try to have better rate control. Ischemic evaluation with cardiac catheterization-right left heart cath.  Pending results, anticipate discussion with EP for the possibility of atrial fibrillation ablation.      Relevant Medications   metoprolol succinate (TOPROL XL) 25 MG 24 hr tablet   Mitral valve regurgitation due to prolapse of cusp (Chronic)    Unusual finding, at the systolic murmur I was suspecting to be systolic ejection and to  be much regurgitation.  Only moderate regurgitation but there is mitral prolapse.  We will need to follow closely.  I suspect that this is exacerbated by being in A. Fib.  Will need to reassess next year.      Relevant Medications   metoprolol succinate (TOPROL XL) 25 MG 24 hr tablet     Other   Fatigue   Relevant Orders   Basic metabolic panel   CBC   Current use of long term anticoagulation:  CHA2DS2-VASc score 4 (Chronic)    Essentially persistent A. fib with reduced EF and mitral prolapse. Calculated CHA2DS2-VASc score is 3-4.  Female, age, CHF.  Quite likely has aortic plaque.  Tolerating Eliquis well.  No major issues. Will need to be on a hold 40 hours preop from cath.  We will start the night after cath.       ===================================  HPI:    [name] is a 75 y.o. female with a PMH notable for a relatively New Diagnosis of A. fib who presents today for close follow-up.  Leza Apsey Hitz was seen for initial evaluation on May 02, 2021: She really noted the fast heart rates and irregular heartbeats when laying down at night.  Otherwise not usually during the course of the day when she is active.  Maybe that her heart rate takes a little longer to slow down and has some fluttering when she is walking fast. No exertional chest tightness and not really any significant dyspnea either.  Mostly resting dyspnea.  She does note easy fatigability, feeling tired most of the time, and when she does feel tired, the palpitations seem to recur (pick up).  Recent Hospitalizations: None  Reviewed  CV studies:    The following studies were reviewed today: (if available, images/films reviewed: From Epic Chart or Care Everywhere) 14-Day Zio Patch Monitor: October 2022 100% A. fib rates ranging from 54 to 178 bpm.  Rare PVCs with occasional couplets. 2D Echo 05/16/2021: Severely reduced LV function-EF of 25 to 30%.  Unable assess diastolic dysfunction due to A. fib.  Mild to moderate LA dilation.  Moderate pericardial effusion.  Moderate MR with holosystolic prolapse of the posterior MV.  Normal RVP and PAP   Interval History:   Shalie Schremp Bann returns here today with her husband to discuss results of her studies.  She really has not noticed any real difference in symptoms.  No real PND or orthopnea.  She just notes that she feels the  irregular heartbeats when she lies down.  She says that sometimes her heart rate goes fast, but it does not last long.  No associated dizziness or wooziness unless it is very fast.  She also occasionally notes some tightness or heaviness in her chest that is usually when lying down, not with exertion.  She actually says she feels better when she gets up to do exercise.  CV Review of Symptoms (Summary) Cardiovascular ROS: positive for - chest pain, dyspnea on exertion, irregular heartbeat, palpitations, rapid heart rate, and shortness of breath negative for - edema, loss of consciousness, orthopnea, paroxysmal nocturnal dyspnea, or Near syncope, TIA/amaurosis fugax or claudication  REVIEWED OF SYSTEMS   Review of Systems  Constitutional:  Positive for malaise/fatigue (Persistent exercise intolerance.  Does not do as well doing yard work, but okay walking without carrying something). Negative for weight loss.  HENT:  Negative for nosebleeds.   Respiratory:  Positive for shortness of breath (Just at baseline-no real worsening..  Brief  little bursts of shortness of breath.). Negative for cough (Nothing sustained).   Cardiovascular:  Negative for claudication and leg swelling.  Gastrointestinal:  Negative for abdominal pain, blood in stool, melena and nausea.  Genitourinary:  Negative for hematuria.  Musculoskeletal:  Positive for joint pain. Negative for myalgias.  Neurological:  Negative for dizziness (Off and on, mostly when she first wakes up.) and weakness.  Psychiatric/Behavioral:  Negative for depression and memory loss. The patient is nervous/anxious. The patient does not have insomnia.    I have reviewed and (if needed) personally updated the patient's problem list, medications, allergies, past medical and surgical history, social and family history.   PAST MEDICAL HISTORY   Past Medical History:  Diagnosis Date   Dilated cardiomyopathy ([place]) 05/28/2021   Diverticulosis 2012   With  intermittent diverticulitis   Endometrial cancer ([place]) 2009   S/p vaginal hysterectomy   Family history of adverse reaction to anesthesia    mother ponv   Glaucoma    Hypothyroidism due to thyroiditis treated with radioactive iodine    Idiopathic pericardial effusion 05/16/2021   Migraine    Mitral valve regurgitation due to prolapse of cusp 05/16/2021   Echo: Moderate MR with holosystolic prolapse of the posterior MV   Multiple sclerosis ([place]) 2008   Well-controlled, currently not on medications.  Repatha subcu 3 times weekly from 2008-2021.  No further relapse.   Persistent atrial fibrillation ([place]) 05/02/2021    PAST SURGICAL HISTORY   Past Surgical History:  Procedure Laterality Date   14-day Zio Patch Monitor  05/2021   October 2022 100% A. fib rates ranging from 54 to 178 bpm.  Rare PVCs with occasional couplets.   cataracts Bilateral    COLONOSCOPY WITH PROPOFOL N/A 12/11/2015   Procedure: COLONOSCOPY WITH PROPOFOL;  Surgeon: Garlan Fair, MD;  Location: [facility];  Service: Endoscopy;  Laterality: N/A;   salzman nodules  12/11/2014   Had one eye then the other on 01/2015   TONSILLECTOMY     TRANSTHORACIC ECHOCARDIOGRAM  05/16/2021   Severely reduced LV function-EF of 25 to 30%.  Unable assess diastolic dysfunction due to A. fib.  Mild to moderate LA dilation.  Moderate pericardial effusion.  Moderate MR with holosystolic prolapse of the posterior MV.  Normal RVP and PAP   VAGINAL HYSTERECTOMY     complete    Immunization History  Administered Date(s) Administered   Influenza-Unspecified 06/17/2014   PFIZER(Purple Top)SARS-COV-2 Vaccination 09/22/2019, 10/13/2019, 05/08/2020    MEDICATIONS/ALLERGIES   Current Meds  Medication Sig   acetaminophen (TYLENOL) 500 MG tablet Take 500-1,000 mg by mouth every 6 (six) hours as needed.   apixaban (ELIQUIS) 5 MG TABS tablet    Calcium Carbonate (CALCIUM 500 PO) Take 2 tablets by mouth daily.   COLLAGEN PO Take 1 tablet  by mouth daily.   latanoprost (XALATAN) 0.005 % ophthalmic solution Place 1 drop into both eyes at bedtime.   levothyroxine (SYNTHROID, LEVOTHROID) 88 MCG tablet Take 88 mcg by mouth See admin instructions. Takes before breakfast Monday through Friday only.   metoprolol succinate (TOPROL XL) 25 MG 24 hr tablet Take 1 tablet (25 mg total) by mouth daily.   Multiple Vitamin (MULTIVITAMIN WITH MINERALS) TABS tablet Take 1 tablet by mouth daily.   SUMAtriptan (IMITREX) 100 MG tablet TAKE 1 TABLET BY MOUTH AS NEEDED FOR HEADACHE MAY REPEAT IN 2 HOURS IF HEADACHE PERSIST    Allergies  Allergen Reactions   Penicillins Rash    As a  child. Has patient had a PCN reaction causing immediate rash, facial/tongue/throat swelling, SOB or lightheadedness with hypotension: No Has patient had a PCN reaction causing severe rash involving mucus membranes or skin necrosis: No Has patient had a PCN reaction that required hospitalization: No Has patient had a PCN reaction occurring within the last 10 years: No If all of the above answers are "NO", then may proceed with Cephalosporin use.    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   Patient has one child -daughter.>    Patient is retired.   Patient has a college education.   Patient is right-handed.   Patient drinks very little caffeine.   Exercise at the gym 2 to 3 days a week, walks routinely and does intermittent weights.    OBJCTIVE -PE, EKG, labs   Wt Readings from Last 3 Encounters:  05/28/21 109 lb (49.4 kg)  05/02/21 109 lb 6.4 oz (49.6 kg)   01/11/21 109 lb (49.4 kg)    Physical Exam: BP 110/80 (BP Location: Left Arm, Patient Position: Sitting, Cuff Size: Normal)   Pulse 99   Ht 5\' 4"  (1.626 m)   Wt 109 lb (49.4 kg)   SpO2 100%   BMI 18.71 kg/m  Physical Exam Constitutional:      Appearance: Normal appearance. She is not ill-appearing or toxic-appearing.     Comments: Somewhat thin, but not necessarily frail appearing.  Well-groomed.  No acute distress.  HENT:     Head: Normocephalic and atraumatic.  Eyes:     Extraocular Movements: Extraocular movements intact.  Neck:     Vascular: No carotid bruit or JVD.  Cardiovascular:     Rate and Rhythm: Tachycardia present. Rhythm irregularly irregular.     Chest Wall: PMI is not displaced.     Pulses: Decreased pulses (Decreased with palpable pedal pulses).     Heart sounds: S1 normal and S2 normal. Murmur heard.  High-pitched blowing holosystolic murmur is present with a grade of 1/6 at the apex.    No friction rub.  Musculoskeletal:     Cervical back: Normal range of motion and neck supple.  Neurological:     Mental Status: She is alert.     Adult ECG Report N/a  Recent Labs:   09/09/2019: TC 219, TG 52, HDL 88, LDL 122.  02/26/2021: Hgb 14, Cr 0.94, K+ 4.0.  Lab Results  Component Value Date   CREATININE 0.75 06/08/2020   BUN 21 06/08/2020   NA 143 06/08/2020   K 3.9 06/08/2020   CL 102 06/08/2020   CO2 28 06/08/2020   CBC Latest Ref Rng & Units 06/08/2020 03/02/2019 08/21/2017  WBC 3.4 - 10.8 x10E3/uL 5.6 4.8 5.3  Hemoglobin 11.1 - 15.9 g/dL 13.6 14.1 14.1  Hematocrit 34.0 - 46.6 % 39.6 42.1 41.6  Platelets 150 - 450 x10E3/uL 213 200 226    No results found for: HGBA1C   ==================================================  COVID-19 Education: The signs and symptoms of COVID-19 were discussed with the  patient and how to seek care for testing (follow up with PCP or arrange E-visit).    I spent a total of 50 minutes with the patient spent in direct  patient consultation.  Additional time spent with chart review  / charting (studies, outside notes, etc): 26 min Total Time: 76 min  Current medicines are reviewed at length with the patient today.  (+/- concerns) none  This visit occurred during the SARS-CoV-2 public health emergency.  Safety protocols were in place, including screening questions prior to the visit, additional usage of staff PPE, and extensive cleaning of exam room while observing appropriate contact time as indicated for disinfecting solutions.  Notice: This dictation was prepared with Dragon dictation along with smart phrase technology. Any transcriptional errors that result from this process are unintentional and may not be corrected upon review.  Patient Instructions / Medication Changes & Studies & Tests Ordered   Shared Decision Making/Informed ConsentThe risks [stroke (1 in 1000), death (1 in 46), kidney failure [usually temporary] (1 in 500), bleeding (1 in 200), allergic reaction [possibly serious] (1 in 200)], benefits (diagnostic support and management of coronary artery disease) and alternatives of a cardiac catheterization were discussed in detail with Ms. Kerman and she is willing to proceed.   Patient Instructions  Medication Instructions:   Toprol 25 mg one tablet daily   *If you need a refill on your cardiac medications before your next appointment, please call your pharmacy*   Lab Work: BMP CBC  If you have labs (blood work) drawn today and your tests are completely normal, you will receive your results only by: [place] (if you have MyChart) OR A paper copy in the mail If you have any lab test that is abnormal or we need to change your treatment, we will call you to review the results.   Testing/Procedures:   Will be  schedule at Jun 07, 2021 at [place] has requested that you have a cardiac catheterization. Cardiac catheterization is used to  diagnose and/or treat various heart conditions. Doctors may recommend this procedure for a number of different reasons. The most common reason is to evaluate chest pain. Chest pain can be a symptom of coronary artery disease (CAD), and cardiac catheterization can show whether plaque is narrowing or blocking your heart's arteries. This procedure is also used to evaluate the valves, as well as measure the blood flow and oxygen levels in different parts of your heart. For further information please visit HugeFiesta.[email or web address]. Please follow instruction sheet, as given.   Follow-Up: At [facility], you and your health needs are our priority.  As part of our continuing mission to provide you with exceptional heart care, we have created designated Provider Care Teams.  These Care Teams include your primary Cardiologist (physician) and Advanced Practice Providers (APPs -  Physician Assistants and Nurse Practitioners) who all work together to provide you with the care you need, when you need it.     Your next appointment:   Keep appointment on  Jun 19, 2021   The format for your next appointment:   In Person  Provider:   Glenetta Hew, MD   Other Instructions    [place] [address] Dept: [phone] Loc: [place]  05/28/2021  You are scheduled for a Cardiac Catheterization on Thursday, October 27 with Dr. Glenetta Hew.   Studies Ordered:  Orders Placed This Encounter  Procedures   Basic metabolic panel   CBC      Glenetta Hew, M.D., M.S. Interventional Cardiologist   Pager # [phone] Phone # [phone] [address] [place], [place] [address]   Thank you for choosing Heartcare at [facility]!!

## 2021-05-28 NOTE — Assessment & Plan Note (Signed)
Unusual finding, at the systolic murmur I was suspecting to be systolic ejection and to be much regurgitation.  Only moderate regurgitation but there is mitral prolapse.  We will need to follow closely.  I suspect that this is exacerbated by being in A. Fib.  Will need to reassess next year.

## 2021-05-28 NOTE — Assessment & Plan Note (Signed)
Significantly reduced EF with no regional wall motion abnormality.  Question is which came first the reduced EF versus A. fib.  There is also the mitral prolapse with regurgitation and pericardial effusion.  Need to exclude ischemic CAD and establish baseline evaluation:  Right Left Heart Catheterization with Possible PCI  Anticipate if nonischemic cardiomyopathy checking cardiac MRI to exclude myocarditis or other type of cardiomyopathy.  Need to establish better rate control, but BP precludes use of high doses of beta-blocker. ->  Start Toprol 25 mg daily.  May consider referral for atrial fibrillation ablation

## 2021-05-28 NOTE — Assessment & Plan Note (Signed)
Interesting that she is got mitral valve prolapse, pericardial effusion with reduced EF.  Need to exclude ischemic cardiomyopathy, but also need to potentially consider myopericarditis as the initial etiologies leading to A. Fib.  I am reluctant to use colchicine since there are no symptoms, but will need to reassess with least limited echo in a few months.  No sign of tamponade from a symptom standpoint.  Consider checking cardiac MRI pending Results

## 2021-05-28 NOTE — Patient Instructions (Addendum)
Medication Instructions:   Toprol 25 mg one tablet daily   *If you need a refill on your cardiac medications before your next appointment, please call your pharmacy*   Lab Work: BMP CBC  If you have labs (blood work) drawn today and your tests are completely normal, you will receive your results only by: [place] (if you have MyChart) OR A paper copy in the mail If you have any lab test that is abnormal or we need to change your treatment, we will call you to review the results.   Testing/Procedures:   Will be  schedule at Jun 07, 2021 at [place] has requested that you have a cardiac catheterization. Cardiac catheterization is used to diagnose and/or treat various heart conditions. Doctors may recommend this procedure for a number of different reasons. The most common reason is to evaluate chest pain. Chest pain can be a symptom of coronary artery disease (CAD), and cardiac catheterization can show whether plaque is narrowing or blocking your heart's arteries. This procedure is also used to evaluate the valves, as well as measure the blood flow and oxygen levels in different parts of your heart. For further information please visit HugeFiesta.[email or web address]. Please follow instruction sheet, as given.   Follow-Up: At [facility], you and your health needs are our priority.  As part of our continuing mission to provide you with exceptional heart care, we have created designated Provider Care Teams.  These Care Teams include your primary Cardiologist (physician) and Advanced Practice Providers (APPs -  Physician Assistants and Nurse Practitioners) who all work together to provide you with the care you need, when you need it.     Your next appointment:   Keep appointment on  Jun 19, 2021   The format for your next appointment:   In Person  Provider:   Glenetta Hew, MD   Other Instructions    [place] [address] Dept: [phone] Loc: [place]  05/28/2021  You are scheduled for a Cardiac Catheterization on Thursday, October 27 with Dr. Glenetta Hew.  1. Please arrive at the   [facility]  registration  ( will contact you  Special note: Every effort is made to have your procedure done on time. Please understand that emergencies sometimes delay scheduled procedures.  2. Diet: Do not eat solid foods after midnight.  The patient may have clear liquids until 5am upon the day of the procedure.  3. Labs: You will need to have blood drawn on BMP,CBC, October 19 at [facility], Go to 1st desk on your right to register.  Address: [place],  [address]  Open: 8am - 5pm  Phone: [phone]. You do not need to be fasting.  4. Medication instructions in preparation for your procedure:    Stop taking Eliquis (Apixiban) on Monday, October 24.   On the morning of your procedure, take your  and any morning medicines NOT listed above.  You may use sips of water.  5. Plan for one night stay--bring personal belongings. 6. Bring a current list of your medications and current insurance cards. 7. You MUST have a responsible person to drive you home. 8. Someone MUST be with you the first 24 hours after you arrive home or your discharge will be delayed. 9. Please wear clothes that are easy to get on and off and wear slip-on  shoes.  Thank you for allowing [place] to care for you!   -- [facility] [address] [facility]

## 2021-05-29 ENCOUNTER — Other Ambulatory Visit: Payer: Self-pay | Admitting: *Deleted

## 2021-05-29 ENCOUNTER — Telehealth: Payer: Self-pay | Admitting: *Deleted

## 2021-05-29 DIAGNOSIS — I208 Other forms of angina pectoris: Secondary | ICD-10-CM

## 2021-05-29 DIAGNOSIS — I420 Dilated cardiomyopathy: Secondary | ICD-10-CM

## 2021-05-29 DIAGNOSIS — R5383 Other fatigue: Secondary | ICD-10-CM

## 2021-05-29 NOTE — Telephone Encounter (Signed)
Called left message to call back  - need to give instruction for upcoming  right and left heart cath     [address], [place] [address] Dept: [phone] Loc: [phone]   You are scheduled for a Cardiac Catheterization on Thursday, November 3 with Dr. Glenetta Hew.  1. Arrive at the [place] entrance at 11:30,  this is one hour prior to your procedure. Free valet service is available.  After entering the [place] please check-in at the registration desk (1st desk on your right) to receive your armband. After receiving your armband someone will escort you to the cardiac cath/special procedures waiting area.  If you have any questions, please call our office at [phone], or you may call the cardiac cath lab at [facility] directly at [phone]   Special note: Every effort is made to have your procedure done on time. Please understand that emergencies sometimes delay scheduled procedures.   2. Diet: Do not eat solid foods after midnight.  The patient may have clear liquids until 5am upon the day of the procedure.  3. Labs: You will need to have blood drawn on CBC BMP, October  19 or 20 or 21 at [facility] Go to 1st desk on your right to register.  Address: [address], [place] [address]  Open: 8am - 5pm  Phone: [phone]. You do not need to be fasting.  Or [facility] at [address] [place]  [address]  Open: Mon-Fri.  8 am - 5 pm closed for lunch 12 pm - 1 pm  Phone [address] [place]. [place] [address] Open: Mon-Fri. 7 am- 4 pm  closed for lunch 12 pm -1 pm  Phone [phone]  4. Medication instructions in preparation for your procedure:  Stop taking Eliquis (Apixiban) on Monday, October 31. That will be the last dose of Monday evening before procedure.  On the morning of your  procedure, take your morning medicines NOT listed above.  You may use sips of water.  5. Plan for one night stay--bring personal belongings. 6. Bring a current list of your medications and current insurance cards. 7. You MUST have a responsible person to drive you home. 8. Someone MUST be with you the first 24 hours after you arrive home or your discharge will be delayed. 9. Please wear clothes that are easy to get on and off and wear slip-on shoes.  Thank you for allowing [place] to care for you!   -- [facility]

## 2021-05-29 NOTE — Telephone Encounter (Signed)
Patient return call .Spoke to patient .  RN informed patient of the new day and time for procedure with Dr Ellyn Hack.  RN informed patient the new instruction were sent to her through mychart portal.   Any question she may call back to the office.   Patient verbalized understanding.

## 2021-05-30 DIAGNOSIS — R5383 Other fatigue: Secondary | ICD-10-CM | POA: Diagnosis not present

## 2021-05-30 DIAGNOSIS — I208 Other forms of angina pectoris: Secondary | ICD-10-CM | POA: Diagnosis not present

## 2021-05-30 DIAGNOSIS — I420 Dilated cardiomyopathy: Secondary | ICD-10-CM | POA: Diagnosis not present

## 2021-05-31 LAB — BASIC METABOLIC PANEL
BUN/Creatinine Ratio: 15 (ref 12–28)
BUN: 13 mg/dL (ref 8–27)
CO2: 27 mmol/L (ref 20–29)
Calcium: 9.6 mg/dL (ref 8.7–10.3)
Chloride: 103 mmol/L (ref 96–106)
Creatinine, Ser: 0.88 mg/dL (ref 0.57–1.00)
Glucose: 97 mg/dL (ref 70–99)
Potassium: 3.9 mmol/L (ref 3.5–5.2)
Sodium: 143 mmol/L (ref 134–144)
eGFR: 70 mL/min/{1.73_m2} (ref >59)

## 2021-05-31 LAB — CBC
Hematocrit: 41.2 % (ref 34.0–46.6)
Hemoglobin: 13.8 g/dL (ref 11.1–15.9)
MCH: 33.1 pg — ABNORMAL HIGH (ref 26.6–33.0)
MCHC: 33.5 g/dL (ref 31.5–35.7)
MCV: 99 fL — ABNORMAL HIGH (ref 79–97)
Platelets: 201 10*3/uL (ref 150–450)
RBC: 4.17 x10E6/uL (ref 3.77–5.28)
RDW: 12.8 % (ref 11.7–15.4)
WBC: 4.9 10*3/uL (ref 3.4–10.8)

## 2021-06-08 ENCOUNTER — Other Ambulatory Visit: Payer: Self-pay | Admitting: *Deleted

## 2021-06-12 ENCOUNTER — Other Ambulatory Visit: Payer: Self-pay | Admitting: Internal Medicine

## 2021-06-12 DIAGNOSIS — Z1231 Encounter for screening mammogram for malignant neoplasm of breast: Secondary | ICD-10-CM

## 2021-06-14 ENCOUNTER — Other Ambulatory Visit: Payer: Self-pay

## 2021-06-14 ENCOUNTER — Encounter
Admission: RE | Disposition: A | Discharge status: Home / Self Care | Payer: Self-pay | Source: Home / Self Care | Attending: Cardiology

## 2021-06-14 ENCOUNTER — Ambulatory Visit
Admission: RE | Admit: 2021-06-14 | Discharge: 2021-06-14 | Disposition: A | Discharge status: Home / Self Care | Payer: Medicare Other | Attending: Cardiology | Admitting: Cardiology

## 2021-06-14 ENCOUNTER — Encounter: Payer: Self-pay | Admitting: Cardiology

## 2021-06-14 ENCOUNTER — Other Ambulatory Visit: Payer: Self-pay | Admitting: Cardiology

## 2021-06-14 DIAGNOSIS — H409 Unspecified glaucoma: Secondary | ICD-10-CM | POA: Insufficient documentation

## 2021-06-14 DIAGNOSIS — G35 Multiple sclerosis: Secondary | ICD-10-CM | POA: Diagnosis not present

## 2021-06-14 DIAGNOSIS — I208 Other forms of angina pectoris: Secondary | ICD-10-CM

## 2021-06-14 DIAGNOSIS — Z28311 Partially vaccinated for covid-19: Secondary | ICD-10-CM | POA: Diagnosis not present

## 2021-06-14 DIAGNOSIS — I4819 Other persistent atrial fibrillation: Secondary | ICD-10-CM | POA: Diagnosis not present

## 2021-06-14 DIAGNOSIS — Z880 Allergy status to penicillin: Secondary | ICD-10-CM | POA: Diagnosis not present

## 2021-06-14 DIAGNOSIS — R5383 Other fatigue: Secondary | ICD-10-CM

## 2021-06-14 DIAGNOSIS — Z7901 Long term (current) use of anticoagulants: Secondary | ICD-10-CM | POA: Insufficient documentation

## 2021-06-14 DIAGNOSIS — E039 Hypothyroidism, unspecified: Secondary | ICD-10-CM | POA: Insufficient documentation

## 2021-06-14 DIAGNOSIS — Z79899 Other long term (current) drug therapy: Secondary | ICD-10-CM | POA: Diagnosis not present

## 2021-06-14 DIAGNOSIS — Z885 Allergy status to narcotic agent status: Secondary | ICD-10-CM | POA: Insufficient documentation

## 2021-06-14 DIAGNOSIS — I341 Nonrheumatic mitral (valve) prolapse: Secondary | ICD-10-CM | POA: Diagnosis not present

## 2021-06-14 DIAGNOSIS — I051 Rheumatic mitral insufficiency: Secondary | ICD-10-CM | POA: Diagnosis not present

## 2021-06-14 DIAGNOSIS — I340 Nonrheumatic mitral (valve) insufficiency: Secondary | ICD-10-CM | POA: Diagnosis not present

## 2021-06-14 DIAGNOSIS — I3139 Other pericardial effusion (noninflammatory): Secondary | ICD-10-CM | POA: Diagnosis not present

## 2021-06-14 DIAGNOSIS — Z79890 Hormone replacement therapy (postmenopausal): Secondary | ICD-10-CM | POA: Diagnosis not present

## 2021-06-14 DIAGNOSIS — I428 Other cardiomyopathies: Secondary | ICD-10-CM | POA: Diagnosis present

## 2021-06-14 DIAGNOSIS — I420 Dilated cardiomyopathy: Secondary | ICD-10-CM | POA: Insufficient documentation

## 2021-06-14 HISTORY — PX: RIGHT/LEFT HEART CATH AND CORONARY ANGIOGRAPHY: CATH118266

## 2021-06-14 SURGERY — RIGHT/LEFT HEART CATH AND CORONARY ANGIOGRAPHY
Anesthesia: Moderate Sedation | Laterality: Bilateral

## 2021-06-14 MED ORDER — SODIUM CHLORIDE 0.9 % WEIGHT BASED INFUSION
1.0000 mL/kg/h | INTRAVENOUS | Status: DC
  Administered 2021-06-14: 1 mL/kg/h via INTRAVENOUS

## 2021-06-14 MED ORDER — HEPARIN (PORCINE) IN NACL 1000-0.9 UT/500ML-% IV SOLN
INTRAVENOUS | Status: AC
  Filled 2021-06-14: qty 1000

## 2021-06-14 MED ORDER — HEPARIN SODIUM (PORCINE) 1000 UNIT/ML IJ SOLN
INTRAMUSCULAR | Status: AC
  Filled 2021-06-14: qty 1

## 2021-06-14 MED ORDER — HEPARIN SODIUM (PORCINE) 1000 UNIT/ML IJ SOLN
INTRAMUSCULAR | Status: DC | PRN
  Administered 2021-06-14: 2500 [IU] via INTRAVENOUS

## 2021-06-14 MED ORDER — ASPIRIN 81 MG PO CHEW: 324.0000 mg | CHEWABLE_TABLET | Freq: Once | ORAL | Status: AC

## 2021-06-14 MED ORDER — LOSARTAN POTASSIUM 25 MG PO TABS: 25.0000 mg | ORAL_TABLET | Freq: Every day | ORAL | 11 refills | Status: DC

## 2021-06-14 MED ORDER — AMIODARONE HCL 200 MG PO TABS: 400.0000 mg | ORAL_TABLET | Freq: Two times a day (BID) | ORAL | 1 refills | Status: DC

## 2021-06-14 MED ORDER — SODIUM CHLORIDE 0.9 % IV SOLN: 250.0000 mL | INTRAVENOUS | Status: DC | PRN

## 2021-06-14 MED ORDER — VERAPAMIL HCL 2.5 MG/ML IV SOLN
INTRAVENOUS | Status: AC
  Filled 2021-06-14: qty 2

## 2021-06-14 MED ORDER — SODIUM CHLORIDE 0.9 % WEIGHT BASED INFUSION
3.0000 mL/kg/h | INTRAVENOUS | Status: AC
  Administered 2021-06-14: 3 mL/kg/h via INTRAVENOUS

## 2021-06-14 MED ORDER — SODIUM CHLORIDE 0.9% FLUSH: 3.0000 mL | Freq: Two times a day (BID) | INTRAVENOUS | Status: DC

## 2021-06-14 MED ORDER — ONDANSETRON HCL 4 MG/2ML IJ SOLN
4.0000 mg | Freq: Once | INTRAMUSCULAR | Status: AC
  Administered 2021-06-14: 4 mg via INTRAVENOUS

## 2021-06-14 MED ORDER — ONDANSETRON HCL 4 MG/2ML IJ SOLN
INTRAMUSCULAR | Status: AC
  Filled 2021-06-14: qty 2

## 2021-06-14 MED ORDER — SODIUM CHLORIDE 0.9% FLUSH: 3.0000 mL | INTRAVENOUS | Status: DC | PRN

## 2021-06-14 MED ORDER — MIDAZOLAM HCL 2 MG/2ML IJ SOLN
INTRAMUSCULAR | Status: DC | PRN
  Administered 2021-06-14: .5 mg via INTRAVENOUS

## 2021-06-14 MED ORDER — HEPARIN (PORCINE) IN NACL 1000-0.9 UT/500ML-% IV SOLN
INTRAVENOUS | Status: DC | PRN
  Administered 2021-06-14 (×2): 500 mL

## 2021-06-14 MED ORDER — SUMATRIPTAN SUCCINATE 50 MG PO TABS
100.0000 mg | ORAL_TABLET | Freq: Once | ORAL | Status: AC
  Administered 2021-06-14: 100 mg via ORAL
  Filled 2021-06-14: qty 2

## 2021-06-14 MED ORDER — IOHEXOL 350 MG/ML SOLN
INTRAVENOUS | Status: DC | PRN
  Administered 2021-06-14: 33 mL via INTRACARDIAC

## 2021-06-14 MED ORDER — VERAPAMIL HCL 2.5 MG/ML IV SOLN
INTRAVENOUS | Status: DC | PRN
  Administered 2021-06-14: 2.5 mg via INTRA_ARTERIAL

## 2021-06-14 MED ORDER — LIDOCAINE HCL 1 % IJ SOLN
INTRAMUSCULAR | Status: AC
  Filled 2021-06-14: qty 20

## 2021-06-14 MED ORDER — SODIUM CHLORIDE 0.9 % IV SOLN
12.5000 mg | Freq: Four times a day (QID) | INTRAVENOUS | Status: DC | PRN
  Administered 2021-06-14: 12.5 mg via INTRAVENOUS
  Filled 2021-06-14: qty 12.5

## 2021-06-14 MED ORDER — ASPIRIN 81 MG PO CHEW
CHEWABLE_TABLET | ORAL | Status: AC
  Administered 2021-06-14: 324 mg via ORAL
  Filled 2021-06-14: qty 4

## 2021-06-14 MED ORDER — FENTANYL CITRATE (PF) 100 MCG/2ML IJ SOLN
INTRAMUSCULAR | Status: AC
  Filled 2021-06-14: qty 2

## 2021-06-14 MED ORDER — MIDAZOLAM HCL 2 MG/2ML IJ SOLN
INTRAMUSCULAR | Status: AC
  Filled 2021-06-14: qty 2

## 2021-06-14 SURGICAL SUPPLY — 12 items
CATH BALLN WEDGE 5F 110CM (CATHETERS) ×1 IMPLANT
CATH INFINITI 5 FR JL3.5 (CATHETERS) ×1 IMPLANT
CATH INFINITI JR4 5F (CATHETERS) ×1 IMPLANT
DEVICE RAD TR BAND REGULAR (VASCULAR PRODUCTS) ×1 IMPLANT
DRAPE BRACHIAL (DRAPES) ×2 IMPLANT
GLIDESHEATH SLEND SS 6F .021 (SHEATH) ×1 IMPLANT
PACK CARDIAC CATH (CUSTOM PROCEDURE TRAY) ×2 IMPLANT
PROTECTION STATION PRESSURIZED (MISCELLANEOUS) ×2
SET ATX SIMPLICITY (MISCELLANEOUS) ×1 IMPLANT
SHEATH GLIDE SLENDER 4/5FR (SHEATH) ×1 IMPLANT
STATION PROTECTION PRESSURIZED (MISCELLANEOUS) IMPLANT
WIRE HI TORQ VERSACORE J 260CM (WIRE) ×1 IMPLANT

## 2021-06-14 NOTE — Interval H&P Note (Signed)
History and Physical Interval Note:  06/14/2021 12:47 PM  [name]  has presented today for surgery, with the diagnosis of RT and LT Cath   Atypical angina / Afib / Dilated Cardiomyopathy.  The various methods of treatment have been discussed with the patient and family. After consideration of risks, benefits and other options for treatment, the patient has consented to  Procedure(s): RIGHT/LEFT HEART CATH AND CORONARY ANGIOGRAPHY (Bilateral)  PERCUTANEOUS CORONARY INTERVENTION   as a surgical intervention.  The patient's history has been reviewed, patient examined, no change in status, stable for surgery.  I have reviewed the patient's chart and labs.  Questions were answered to the patient's satisfaction.    Cath Lab Visit (complete for each Cath Lab visit)  Clinical Evaluation Leading to the Procedure:   ACS: No.  Non-ACS:    Anginal Classification: CCS II - NYHA CLASSII CHF   Anti-ischemic medical therapy: Minimal Therapy (1 class of medications)  Non-Invasive Test Results: High-risk stress test findings: cardiac mortality >3%/year - ef 25-30%  Prior CABG: No previous CABG    Glenetta Hew

## 2021-06-14 NOTE — Brief Op Note (Signed)
    BRIEF RIGHT AND LEFT HEART CATH NOTE   Primary Care Provider: Leeroy Cha, MD Cardiologist: Glenetta Hew, MD  06/14/2021 2:49 PM   PROCEDURE:  Procedure(s): RIGHT/LEFT HEART CATH AND CORONARY ANGIOGRAPHY (Bilateral)  SURGEON:  Surgeon(s) and Role:    * Leonie Man, MD - Primary  PATIENT:  [name]  75 y.o. female with recently diagnosed PERSISTENT ATRIAL FIBRILLATION and DILATED CARDIOMYOPATHY with.  She is referred for right and left heart catheterization for ischemic evaluation.  PRE-OPERATIVE DIAGNOSIS: Dilated cardiomyopathy, atypical angina, persistent A. fib.  POST-OPERATIVE DIAGNOSIS:   Angiographic normal coronary arteries-nonischemic cardiomyopathy Severely reduced cardiac output-index: Output 3.2, index 2.1 Remains in A. Fib RHC numbers: RAP 13/15-12 mmHg, RVP 27/3/10 mmHg; PAP-mean 30/18/23 mmHg, PCWP mean 20 mmHg.  LVEDP-EDP 122/11 mmHg-21 mmHg   RIGHT & LEFT HEART CATHETERIZATION WITH CORONARY ANGIOGRAPHY Time Out: Verified patient identification, verified procedure, site/side was marked, verified correct patient position, special equipment/implants available, medications/allergies/relevent history reviewed, required imaging and test results available. Performed.  Access:  RIGHT Radial Artery: 6 Fr sheath -- Seldinger technique using Micropuncture Kit Direct ultrasound guidance used.  Permanent image obtained and placed on chart. 10 mL radial cocktail IA; 2500 Units IV Heparin * Right Brachial Vein: 5Fr  sheath -- Seldinger technique using Micropuncture Kit Direct ultrasound guidance used.  Permanent image obtained and placed on chart.  Right Heart Catheterization: 5 Fr Gordy Councilman catheter advanced under fluoroscopy with balloon inflated to the RA, RV, then PCWP-PA for hemodynamic measurement.  * Simultaneous FA & PA blood gases checked for SaO2% to calculate FICK CO/CI  * Catheter removed completely out of the body with balloon  deflated.  Left Heart Catheterization: 5Fr Catheters advanced or exchanged over a J-wire under direct fluoroscopic guidance into the ascending aorta; JR4 catheter advanced first.  * LV Hemodynamics (no LV Gram): JR4 catheter * Left Coronary Artery Cineangiography: JL 3.5 catheter  * Right Coronary Artery Cineangiography: JR4 catheter   Upon completion of Angiogaphy, the catheter was removed completely out of the body over a wire, without complication.  Radial sheath removed in the Cardiac Catheterization lab with TR Band placed for hemostasis.  TR Band: 1440  Hours; 13 mL air  MEDICATIONS * SQ Lidocaine 37mL * Radial Cocktail: 3 mg Verapmil in 10 mL NS * Isovue Contrast: 35 mL * Heparin: 2500 Units   EBL:  <20 mL   BLOOD ADMINISTERED:none  COUNTS:  YES  DICTATION: .Note written in EPIC  PLAN OF CARE: Discharge to home after PACU Restart Eliquis tonight, continue current dose Toprol Will add 25 mg losartan Will start amiodarone 400 mg twice daily x7 days, then 20 mg twice daily x7 days then 20 mg daily. ->  We will attempt facilitated PCI after being back on [place] for 1 month. She will continue to follow-up as scheduled on the November 8  PATIENT DISPOSITION:  PACU - hemodynamically stable.   Delay start of Pharmacological VTE agent (>24hrs) due to surgical blood loss or risk of bleeding: not applicable   Glenetta Hew, M.D., M.S. Interventional Cardiologist   Pager # [phone] Phone # [phone] [address] [place], [place] [address]

## 2021-06-14 NOTE — Progress Notes (Signed)
No new visible hematoma at right radial site. Report received from [place], vitals remain stable at this time husband at bedside.

## 2021-06-15 ENCOUNTER — Telehealth: Payer: Self-pay | Admitting: Cardiology

## 2021-06-15 ENCOUNTER — Other Ambulatory Visit: Payer: Self-pay | Admitting: Cardiology

## 2021-06-15 NOTE — Telephone Encounter (Signed)
Pt c/o medication issue:  1. Name of Medication: amiodarone (PACERONE) 200 MG tablet  2. How are you currently taking this medication (dosage and times per day)? N/A  3. Are you having a reaction (difficulty breathing--STAT)? N/A  4. What is your medication issue? Needing verification on instructions

## 2021-06-15 NOTE — Telephone Encounter (Signed)
Refill sent 06/14/21

## 2021-06-15 NOTE — Telephone Encounter (Signed)
Filled 06/14/21

## 2021-06-18 ENCOUNTER — Encounter: Payer: Self-pay | Admitting: Cardiology

## 2021-06-19 ENCOUNTER — Other Ambulatory Visit: Payer: Self-pay

## 2021-06-19 ENCOUNTER — Encounter: Payer: Self-pay | Admitting: Cardiology

## 2021-06-19 ENCOUNTER — Ambulatory Visit: Payer: Medicare Other | Admitting: Cardiology

## 2021-06-19 VITALS — BP 110/58 | HR 54 | Ht 64.5 in | Wt 109.8 lb

## 2021-06-19 DIAGNOSIS — Z7901 Long term (current) use of anticoagulants: Secondary | ICD-10-CM | POA: Diagnosis not present

## 2021-06-19 DIAGNOSIS — Z5181 Encounter for therapeutic drug level monitoring: Secondary | ICD-10-CM

## 2021-06-19 DIAGNOSIS — I341 Nonrheumatic mitral (valve) prolapse: Secondary | ICD-10-CM | POA: Diagnosis not present

## 2021-06-19 DIAGNOSIS — I4819 Other persistent atrial fibrillation: Secondary | ICD-10-CM

## 2021-06-19 DIAGNOSIS — I340 Nonrheumatic mitral (valve) insufficiency: Secondary | ICD-10-CM

## 2021-06-19 DIAGNOSIS — I255 Ischemic cardiomyopathy: Secondary | ICD-10-CM | POA: Diagnosis not present

## 2021-06-19 DIAGNOSIS — I3139 Other pericardial effusion (noninflammatory): Secondary | ICD-10-CM | POA: Diagnosis not present

## 2021-06-19 DIAGNOSIS — Z79899 Other long term (current) drug therapy: Secondary | ICD-10-CM

## 2021-06-19 DIAGNOSIS — I420 Dilated cardiomyopathy: Secondary | ICD-10-CM

## 2021-06-19 MED ORDER — FUROSEMIDE 20 MG PO TABS: ORAL_TABLET | ORAL | 6 refills | Status: DC

## 2021-06-19 MED ORDER — AMIODARONE HCL 200 MG PO TABS: 200.0000 mg | ORAL_TABLET | Freq: Every day | ORAL | 5 refills | Status: DC

## 2021-06-19 NOTE — Progress Notes (Signed)
Primary Care Provider: Leeroy Cha, MD Cardiologist: Glenetta Hew, MD Electrophysiologist: None  Clinic Note: Chief Complaint  Patient presents with   Hospitalization Follow-up    Post cath follow-up   Cardiomyopathy    Nonischemic.  Mild swelling and mild cough with lying down.   Atrial Fibrillation    Started on amiodarone load in the hospital, thinks she may have come out of atrial fibrillation     ===================================  ASSESSMENT/PLAN   Problem List Items Addressed This Visit       Cardiology Problems   Nonischemic cardiomyopathy ([place]) (Chronic)    Hopefully, A. fib this is a 80 etiology for cardiomyopathy since she was borderline rate controlled prior to presentation here.  Cannot be sure.  A. fib as etiology for the effect. She may be having little orthopnea manifested by the cough and lying down, and a little bit of edema.  We will add a loop diuretic.  Plan: While on higher dose amiodarone, hold Toprol (restart on Friday, November 11) Start Lasix 20 mg daily for 1 week and then assess nocturnal cough/orthopnea-if improved, can attempt PRN dosing and sliding scale dosing based on 3 pound weight gain. Was started on losartan 25 mg.  Borderline blood pressures-would like to try to titrate to [facility] but need to see how her pressures are when she is on combination of losartan and Toprol.  Will continue on amiodarone for least another 2 months in order to recheck echocardiogram while out of A. fib (will order for January 2023.)      Relevant Medications   furosemide (LASIX) 20 MG tablet   amiodarone (PACERONE) 200 MG tablet   Idiopathic pericardial effusion (Chronic)    With likely etiology being A. fib, we can reassess echocardiogram in January.  If still present, would then consider cardiac MRI.      Relevant Medications   furosemide (LASIX) 20 MG tablet   amiodarone (PACERONE) 200 MG tablet   Persistent atrial fibrillation (HCC) -  Primary (Chronic)    Thankfully, it seems like paroxysmal persistent A. fib.  She seems like she was in it for a long time and if it started it was the etiology for her cardiomyopathy.  Now converted to sinus rhythm/sinus bradycardia on amiodarone.  Since she is having the nausea with a higher dose, speed up her loading taper. Starting tomorrow will take amiodarone 200 mg twice daily (for 400 mg once daily) for Wednesday and Thursday, then on Friday start 200 mg once daily that she will take until Friday, November 25 -> then reduce to 100 mg (1/2 tablet) daily.  I have discussed her with Dr. Curt Bears who agrees with this plan, the plan will be to then recheck her echocardiogram to assess EF, and if this is indeed the etiology for her cardiomyopathy, will refer for ablation.      Relevant Medications   furosemide (LASIX) 20 MG tablet   amiodarone (PACERONE) 200 MG tablet   Other Relevant Orders   EKG 12-Lead (Completed)   ECHOCARDIOGRAM COMPLETE   Mitral valve regurgitation due to prolapse of cusp (Chronic)    Systolic murmur sounds more like ejection murmur but there is some MR on echo.  May very well be related to functional MR from dilated cardiomyopathy.  We will reassess on follow-up echo.      Relevant Medications   furosemide (LASIX) 20 MG tablet   amiodarone (PACERONE) 200 MG tablet   Other Relevant Orders   ECHOCARDIOGRAM COMPLETE  Other   On amiodarone therapy    Amiodarone started with an intention to facilitate cardioversion, and it seems to itself have allow for chemical cardioversion.  Maintaining therapeutic DOAC with Eliquis.  My goal will be for her to continue for least 4 months on amiodarone protection to maintenance dose of 100 mg daily at the end of this first month.  We can then reassess EF by echo while in normal sinus rhythm in order to determine if A. fib as a cause for cardiomyopathy.   Pending results comment is made referral to Dr. Curt Bears for Dr. Quentin Ore  from [place].  Would hope that amiodarone would not be a long-term medicine at least 49-monthtimeframe.  Would prefer different AAD or even A. fib ablation over long-term amiodarone.  However, if it is continued we will need to evaluate thyroid panel, LFTs and ESR/CRP plus or minus pulmonary function test..      Therapeutic drug monitoring    She is now on both Eliquis and amiodarone which need to be monitored closely.      Current use of long term anticoagulation: CHA2DS2-VASc score 4 (Chronic)    Remains on Eliquis. Restarted on the evening post cath.  Will need to stay on consistently for 4 weeks.  Try not to miss any doses regardless but unless significant bleeding, would continue at least with pill November 25 not missing a dose.      ===================================  HPI:    LMarylynn Rigdonis a 75y.o. female with a PMH notable New diagnosis of DILATED CARDIOMYOPATHY identified as part of evaluation for new diagnosis of ATRIAL FIBRILLATION who presents here for post-cath follow-up after her right left heart cath.  LBobie Olson was seen for initial evaluation on May 02, 2021 for evaluation of atrial fibrillation.  She only noted fast heart rates or irregular heartbeats when lying down at night.  Otherwise staying active with no major issues.  She noted that her heart rate may take a little longer to slow down after exercise.  No exertional chest tightness or pressure.  Also no exertional dyspnea.  Some resting dyspnea, but also noted exertional fatigue. ->  Her PCP did start Eliquis. She was initially evaluated with an echocardiogram showing severely reduced EF of 25 to 30% (reviewed below) as well as a Zio patch monitor showing 100% persistent A. fib.  With the newly reduced EF, after her October 17 follow-up visit, she was referred for right and left heart catheterization. Low-dose Toprol started.  Recent Hospitalizations:  [facility]  Reviewed  CV studies:    The following studies were reviewed today: (if available, images/films reviewed: From Epic Chart or Care Everywhere) 14-Day Zio Patch Monitor: October 2022 100% A. fib rates ranging from 54 to 178 bpm.  Rare PVCs with occasional couplets. 2D Echo 05/16/2021: Severely reduced LV function-EF of 25 to 30%.  Unable assess diastolic dysfunction due to A. fib.  Mild to moderate LA dilation.  Moderate pericardial effusion.  Moderate MR with holosystolic prolapse of the posterior MV.  Normal RVP and PAP RLCP 06/14/2021: Angiographic normal coronary arteries.  Cardiac output-index (3.2-2.1). RAP 13/15-12 mmHg, RVP 27/3/10 mmHg; PAP-mean 30/18/23 mmHg, PCWP mean 20 mmHg.  LVEDP-EDP 122/11 mmHg-21 mmHg Restart Eliquis & continue current dose Toprol Will add 25 mg losartan Will start amiodarone 400 mg twice daily x7 days, then 20 mg twice daily x7 days then 20 mg daily. ->  We will  attempt facilitated PCI after being back on [facility] for 1 month.   Interval History:   Aamiyah Derrick Goldwire returns here today with her husband to discuss results of her cardiac cath results & plans going forward.   Thankfully, she recovered from her migraine headache that she had at the time of her catheterization.  She actually has been feeling relatively well since the Procedure.  She said that she got little nauseated with high-dose of amiodarone but last night she felt that the fluttering in her chest has gone away and also is noticing a little short little flutters but nothing like the prolonged irregular heartbeats that she is noticing before.  They are wondering if she may have converted to sinus rhythm.  She definitely is noted that her heart rate is much lower and she does not feel as short of breath.  Up until last night she was noticing a little bit of cough when lying down, and a little swelling at the end of the day.  Otherwise no significant PND, orthopnea or edema.  She has not  had any resting exertional chest pain or pressure.  Not really having any exertional dyspnea or fatigue.  She denies any bleeding issues.  She actually feels better after thinking that she may be in sinus rhythm now.  CV Review of Symptoms (Summary) Cardiovascular ROS: no chest pain or dyspnea on exertion positive for - edema, irregular heartbeat, orthopnea, palpitations, and -she thinks that she came out of the abnormal rhythm because she is now just feeling really little episodes of flutters or skipping in her chest, not the prolonged feeling that she has been having. negative for - edema, loss of consciousness, orthopnea, paroxysmal nocturnal dyspnea, rapid heart rate, shortness of breath, or Near syncope, TIA/amaurosis fugax or claudication  REVIEWED OF SYSTEMS   Review of Systems  Constitutional:  Positive for malaise/fatigue (She felt much better during the day yesterday, less dyspneic and less fatigue with exercise.). Negative for weight loss.  HENT:  Negative for nosebleeds.   Respiratory:  Positive for cough (A little coughing at night.). Negative for shortness of breath (No real shortness of breath at this point.).   Cardiovascular:  Positive for leg swelling (Mild end of day). Negative for claudication.  Gastrointestinal:  Negative for abdominal pain, blood in stool, melena and nausea.  Genitourinary:  Negative for hematuria.  Musculoskeletal:  Positive for joint pain. Negative for myalgias.  Neurological:  Negative for dizziness (Off and on, mostly when she first wakes up.) and weakness.  Psychiatric/Behavioral:  Negative for depression and memory loss. The patient is not nervous/anxious (Actually much less anxious since her heart cath, and since she feels like she may not be in atrial fibrillation anymore.) and does not have insomnia.    I have reviewed and (if needed) personally updated the patient's problem list, medications, allergies, past medical and surgical history, social and  family history.   PAST MEDICAL HISTORY   Past Medical History:  Diagnosis Date   Dilated cardiomyopathy ([place]) 05/28/2021   NON-ISCHEMIC   Diverticulosis 2012   With intermittent diverticulitis   Endometrial cancer ([place]) 2009   S/p vaginal hysterectomy   Family history of adverse reaction to anesthesia    mother ponv   Glaucoma    Hypothyroidism due to thyroiditis treated with radioactive iodine    Idiopathic pericardial effusion 05/16/2021   Migraine    Mitral valve regurgitation due to prolapse of cusp 05/16/2021   Echo: Moderate MR with holosystolic prolapse  of the posterior MV   Multiple sclerosis ([place]) 2008   Well-controlled, currently not on medications.  Repatha subcu 3 times weekly from 2008-2021.  No further relapse.   Persistent atrial fibrillation ([place]) 05/02/2021    PAST SURGICAL HISTORY   Past Surgical History:  Procedure Laterality Date   14-day Zio Patch Monitor  05/2021   October 2022 100% A. fib rates ranging from 54 to 178 bpm.  Rare PVCs with occasional couplets.   cataracts Bilateral    COLONOSCOPY WITH PROPOFOL N/A 12/11/2015   Procedure: COLONOSCOPY WITH PROPOFOL;  Surgeon: Garlan Fair, MD;  Location: [facility];  Service: Endoscopy;  Laterality: N/A;   RIGHT/LEFT HEART CATH AND CORONARY ANGIOGRAPHY Bilateral 06/14/2021   Procedure: RIGHT/LEFT HEART CATH AND CORONARY ANGIOGRAPHY;  Surgeon: Leonie Man, MD;  Location: [place] CV LAB;  Service: Cardiovascular; Angiographic normal coronary arteries.  Cardiac output-index (3.2-2.1). RAP 13/15-12 mmHg, RVP 27/3/10 mmHg; PAP-mean 30/18/23 mmHg, PCWP mean 20 mmHg.  LVEDP-EDP 122/11 mmHg-21 mmHg   salzman nodules  12/11/2014   Had one eye then the other on 01/2015   TONSILLECTOMY     TRANSTHORACIC ECHOCARDIOGRAM  05/16/2021   Severely reduced LV function-EF of 25 to 30%.  Unable assess diastolic dysfunction due to A. fib.  Mild to moderate LA dilation.  Moderate pericardial effusion.  Moderate MR  with holosystolic prolapse of the posterior MV.  Normal RVP and PAP   VAGINAL HYSTERECTOMY     complete    Immunization History  Administered Date(s) Administered   Influenza-Unspecified 06/17/2014   PFIZER(Purple Top)SARS-COV-2 Vaccination 05/08/2020    MEDICATIONS/ALLERGIES   Current Meds  Medication Sig   acetaminophen (TYLENOL) 500 MG tablet Take 500-1,000 mg by mouth every 6 (six) hours as needed for moderate pain or headache.   apixaban (ELIQUIS) 5 MG TABS tablet Take 5 mg by mouth 2 (two) times daily.   b complex vitamins capsule Take 1 capsule by mouth daily.   Biotin 10000 MCG TABS Take 10,000 mcg by mouth daily.   Calcium Carb-Cholecalciferol (CALCIUM 500/VITAMIN D PO) Take 2 each by mouth daily. Chewables   Cholecalciferol (VITAMIN D) 50 MCG (2000 UT) tablet Take 2,000 Units by mouth daily.   COLLAGEN PO Take 1 Scoop by mouth daily.   furosemide (LASIX) 20 MG tablet Take 20 mg tablet daily for one week  afterwards you may use on as needed basis daily for  shortness of breath or weight gain of more than 3 lbs or more   latanoprost (XALATAN) 0.005 % ophthalmic solution Place 1 drop into both eyes at bedtime.   levothyroxine (SYNTHROID, LEVOTHROID) 88 MCG tablet Take 88 mcg by mouth See admin instructions. Takes before breakfast Monday through Friday only.   losartan (COZAAR) 25 MG tablet Take 1 tablet (25 mg total) by mouth daily.   magnesium oxide (MAG-OX) 400 MG tablet Take 400 mg by mouth daily.   metoprolol succinate (TOPROL XL) 25 MG 24 hr tablet Take 1 tablet (25 mg total) by mouth daily. (Patient taking differently: Take 25 mg by mouth at bedtime.)   Misc Natural Products (GLUCOSAMINE CHOND COMPLEX/MSM PO) Take 1 tablet by mouth daily. Replenex   Prenatal Vit-Fe Fumarate-FA (PRENATAL MULTIVITAMIN) TABS tablet Take 1 tablet by mouth daily.   SUMAtriptan (IMITREX) 100 MG tablet TAKE 1 TABLET BY MOUTH AS NEEDED FOR HEADACHE MAY REPEAT IN 2 HOURS IF HEADACHE PERSIST    Vitamin A 2400 MCG (8000 UT) CAPS Take 8,000 Units by mouth daily.  zinc gluconate 50 MG tablet Take 50 mg by mouth 3 (three) times a week.   [DISCONTINUED] amiodarone (PACERONE) 200 MG tablet Take 2 tablets (400 mg total) by mouth 2 (two) times daily for 28 days. Take 2 tablets (400 mg) by mouth 2 times daily x7 days, Then take 1 tablet 2 times a day for 7 days, then reduce to 1 tablet daily x2 weeks    Allergies  Allergen Reactions   Penicillins Rash    As a child. Has patient had a PCN reaction causing immediate rash, facial/tongue/throat swelling, SOB or lightheadedness with hypotension: No Has patient had a PCN reaction causing severe rash involving mucus membranes or skin necrosis: No Has patient had a PCN reaction that required hospitalization: No Has patient had a PCN reaction occurring within the last 10 years: No If all of the above answers are "NO", then may proceed with Cephalosporin use.    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   Patient has one child -daughter.>    Patient is retired.   Patient has a college education.   Patient is right-handed.   Patient drinks very little caffeine.   Exercise at the gym 2 to 3 days a week, walks routinely and does intermittent weights.    OBJCTIVE -PE, EKG, labs   Wt Readings from Last 3 Encounters:  06/19/21 109 lb 12.8 oz (49.8 kg)  06/14/21 110 lb (49.9 kg)  05/28/21 109 lb (49.4 kg)    Physical Exam: BP (!) 110/58 (BP Location: Left Arm, Patient Position: Sitting, Cuff Size: Normal)   Pulse (!) 54   Ht 5' 4.5"  (1.638 m)   Wt 109 lb 12.8 oz (49.8 kg)   SpO2 98%   BMI 18.56 kg/m  Physical Exam Vitals reviewed.  Constitutional:      Appearance: Normal appearance. She is normal weight. She is not ill-appearing or toxic-appearing.     Comments: Somewhat thin, but not frail.  Well groomed.  Well-nourished.  HENT:     Head: Normocephalic and atraumatic.  Eyes:     Extraocular Movements: Extraocular movements intact.  Neck:     Vascular: No carotid bruit or JVD.  Cardiovascular:     Rate and Rhythm: Regular rhythm. Bradycardia present. Occasional Extrasystoles are present.    Chest Wall: PMI is not displaced.     Pulses: Intact distal pulses. Decreased pulses: Decreased with palpable pedal pulses.     Heart sounds: S1 normal and S2 normal. Murmur heard.  High-pitched blowing holosystolic murmur is present with a grade of 1/6 at the apex.    No friction rub. No gallop.  Musculoskeletal:     Cervical back: Normal range of motion and neck supple.  Neurological:     Mental Status: She is alert.     Adult ECG Report  Rate: 54 ;  Rhythm: normal sinus rhythm and premature atrial contractions (PAC); cannot exclude sinus arrhythmia.  Normal axis and durations.    Narrative Interpretation: Sinus bradycardia has  replaced A. fib.   Recent Labs:   09/09/2019: TC 219, TG 52, HDL 88, LDL 122.  02/26/2021: Hgb 14, Cr 0.94, K+ 4.0.  Lab Results  Component Value Date   CREATININE 0.88 05/30/2021   BUN 13 05/30/2021   NA 143 05/30/2021   K 3.9 05/30/2021   CL 103 05/30/2021   CO2 27 05/30/2021   CBC Latest Ref Rng & Units 05/30/2021 06/08/2020 03/02/2019  WBC 3.4 - 10.8 x10E3/uL 4.9 5.6 4.8  Hemoglobin 11.1 - 15.9 g/dL 13.8 13.6 14.1  Hematocrit 34.0 - 46.6 % 41.2 39.6 42.1  Platelets 150 - 450 x10E3/uL 201 213 200    No results found for: HGBA1C   ==================================================  COVID-19 Education: The signs and symptoms of COVID-19 were discussed with the patient and how  to seek care for testing (follow up with PCP or arrange E-visit).    I spent a total of 26  minutes with the patient spent in direct patient consultation.  Additional time spent with chart review  / charting (studies, outside notes, etc): 22 min Total Time: 48 min  Current medicines are reviewed at length with the patient today.  (+/- concerns) none  This visit occurred during the SARS-CoV-2 public health emergency.  Safety protocols were in place, including screening questions prior to the visit, additional usage of staff PPE, and extensive cleaning of exam room while observing appropriate contact time as indicated for disinfecting solutions.  Notice: This dictation was prepared with Dragon dictation along with smart phrase technology. Any transcriptional errors that result from this process are unintentional and may not be corrected upon review.  Studies Ordered:   Orders Placed This Encounter  Procedures   EKG 12-Lead   ECHOCARDIOGRAM COMPLETE     Patient Instructions / Medication Changes & Studies & Tests Ordered   Patient Instructions  Medication Instructions:     Do not take Metoprolol  until Friday  Jun 22, 2021  Starting tomorrow Wednesday and Thursday  Take amiodarone  200 mg  twice a day   then Friday Nov 11,2022   start  200 mg  amiodarone  daily  until    Friday Nov 25,2022 reduce to Amiodarone  100 mg ( 1/2 tablet) of 200 mg  daily    Lasix 20 mg  take daily for next one week - if you are not having any shortness of breath when  waking up  you can you use medication on as needed basis.  You can also take medication if you have a weight gain of 3 or more overnight.     *If you need a refill on your cardiac medications before your next appointment, please call your pharmacy*   Lab Work: Not needed    Testing/Procedures:   Will be schedule at [facility]  [address] Jan 2023 Your physician has requested that you have an echocardiogram.  Echocardiography is a painless test that uses sound waves to create images of your heart. It provides your doctor with information about the size and shape of your heart and how well your heart's chambers and valves are working. This procedure takes approximately one hour. There are no restrictions for this procedure.   Follow-Up: At [facility], you and your health needs are our priority.  As part of our continuing mission to provide you with exceptional heart care, we have created designated Provider Care Teams.  These Care Teams include your primary Cardiologist (physician) and Advanced Practice Providers (APPs -  Physician Assistants and Nurse Practitioners) who all work together to provide you with the care you need, when you need it.     Your next appointment:   2 month(s)  The format for your next appointment:   In Person  Provider:   Glenetta Hew, MD          Glenetta Hew, M.D., M.S. Interventional Cardiologist   Pager # [phone] Phone # [phone] [address] [place], [place] [address]   Thank you for choosing Heartcare at [facility]!!

## 2021-06-19 NOTE — Telephone Encounter (Signed)
Late entry - RN spoke to pharmacist on 06/15/21.  Direction clarified per Dr Ellyn Hack instructions

## 2021-06-19 NOTE — Patient Instructions (Addendum)
Medication Instructions:     Do not take Metoprolol  until Friday  Jun 22, 2021  Starting tomorrow Wednesday and Thursday  Take amiodarone  200 mg  twice a day   then Friday Nov 11,2022   start  200 mg  amiodarone  daily  until    Friday Nov 25,2022 reduce to Amiodarone  100 mg ( 1/2 tablet) of 200 mg  daily    Lasix 20 mg  take daily for next one week - if you are not having any shortness of breath when  waking up  you can you use medication on as needed basis.  You can also take medication if you have a weight gain of 3 or more overnight.     *If you need a refill on your cardiac medications before your next appointment, please call your pharmacy*   Lab Work: Not needed    Testing/Procedures:   Will be schedule at [facility]  [address] Jan 2023 Your physician has requested that you have an echocardiogram. Echocardiography is a painless test that uses sound waves to create images of your heart. It provides your doctor with information about the size and shape of your heart and how well your heart's chambers and valves are working. This procedure takes approximately one hour. There are no restrictions for this procedure.   Follow-Up: At [facility], you and your health needs are our priority.  As part of our continuing mission to provide you with exceptional heart care, we have created designated Provider Care Teams.  These Care Teams include your primary Cardiologist (physician) and Advanced Practice Providers (APPs -  Physician Assistants and Nurse Practitioners) who all work together to provide you with the care you need, when you need it.     Your next appointment:   2 month(s)  The format for your next appointment:   In Person  Provider:   Glenetta Hew, MD

## 2021-06-20 ENCOUNTER — Encounter: Payer: Self-pay | Admitting: Cardiology

## 2021-06-20 DIAGNOSIS — Z79899 Other long term (current) drug therapy: Secondary | ICD-10-CM | POA: Insufficient documentation

## 2021-06-20 NOTE — Assessment & Plan Note (Signed)
Thankfully, it seems like paroxysmal persistent A. fib.  She seems like she was in it for a long time and if it started it was the etiology for her cardiomyopathy.  Now converted to sinus rhythm/sinus bradycardia on amiodarone.  Since she is having the nausea with a higher dose, speed up her loading taper. Starting tomorrow will take amiodarone 200 mg twice daily (for 400 mg once daily) for Wednesday and Thursday, then on Friday start 200 mg once daily that she will take until Friday, November 25 -> then reduce to 100 mg (1/2 tablet) daily.  I have discussed her with Dr. Curt Bears who agrees with this plan, the plan will be to then recheck her echocardiogram to assess EF, and if this is indeed the etiology for her cardiomyopathy, will refer for ablation.

## 2021-06-20 NOTE — Assessment & Plan Note (Signed)
She is now on both Eliquis and amiodarone which need to be monitored closely.

## 2021-06-20 NOTE — Assessment & Plan Note (Addendum)
Hopefully, A. fib this is a 80 etiology for cardiomyopathy since she was borderline rate controlled prior to presentation here.  Cannot be sure.  A. fib as etiology for the effect. She may be having little orthopnea manifested by the cough and lying down, and a little bit of edema.  We will add a loop diuretic.  Plan:  While on higher dose amiodarone, hold Toprol (restart on Friday, November 11)  Start Lasix 20 mg daily for 1 week and then assess nocturnal cough/orthopnea-if improved, can attempt PRN dosing and sliding scale dosing based on 3 pound weight gain.  Was started on losartan 25 mg.  Borderline blood pressures-would like to try to titrate to [facility] but need to see how her pressures are when she is on combination of losartan and Toprol.   Will continue on amiodarone for least another 2 months in order to recheck echocardiogram while out of A. fib (will order for January 2023.)

## 2021-06-20 NOTE — Assessment & Plan Note (Addendum)
Amiodarone started with an intention to facilitate cardioversion, and it seems to itself have allow for chemical cardioversion.  Maintaining therapeutic DOAC with Eliquis.  My goal will be for her to continue for least 4 months on amiodarone protection to maintenance dose of 100 mg daily at the end of this first month.  We can then reassess EF by echo while in normal sinus rhythm in order to determine if A. fib as a cause for cardiomyopathy.   Pending results comment is made referral to Dr. Curt Bears for Dr. Quentin Ore from [place].  Would hope that amiodarone would not be a long-term medicine at least 36-monthtimeframe.  Would prefer different AAD or even A. fib ablation over long-term amiodarone.  However, if it is continued we will need to evaluate thyroid panel, LFTs and ESR/CRP plus or minus pulmonary function test..

## 2021-06-20 NOTE — Assessment & Plan Note (Signed)
Systolic murmur sounds more like ejection murmur but there is some MR on echo.  May very well be related to functional MR from dilated cardiomyopathy.  We will reassess on follow-up echo.

## 2021-06-20 NOTE — Assessment & Plan Note (Signed)
Remains on Eliquis. Restarted on the evening post cath.  Will need to stay on consistently for 4 weeks.  Try not to miss any doses regardless but unless significant bleeding, would continue at least with pill November 25 not missing a dose.

## 2021-06-20 NOTE — Assessment & Plan Note (Signed)
With likely etiology being A. fib, we can reassess echocardiogram in January.  If still present, would then consider cardiac MRI.

## 2021-07-23 DIAGNOSIS — L570 Actinic keratosis: Secondary | ICD-10-CM | POA: Diagnosis not present

## 2021-07-23 DIAGNOSIS — L814 Other melanin hyperpigmentation: Secondary | ICD-10-CM | POA: Diagnosis not present

## 2021-07-23 DIAGNOSIS — B351 Tinea unguium: Secondary | ICD-10-CM | POA: Diagnosis not present

## 2021-07-23 DIAGNOSIS — B078 Other viral warts: Secondary | ICD-10-CM | POA: Diagnosis not present

## 2021-07-23 DIAGNOSIS — D2271 Melanocytic nevi of right lower limb, including hip: Secondary | ICD-10-CM | POA: Diagnosis not present

## 2021-07-23 DIAGNOSIS — L438 Other lichen planus: Secondary | ICD-10-CM | POA: Diagnosis not present

## 2021-07-23 DIAGNOSIS — D2272 Melanocytic nevi of left lower limb, including hip: Secondary | ICD-10-CM | POA: Diagnosis not present

## 2021-07-23 DIAGNOSIS — L821 Other seborrheic keratosis: Secondary | ICD-10-CM | POA: Diagnosis not present

## 2021-07-23 DIAGNOSIS — D1801 Hemangioma of skin and subcutaneous tissue: Secondary | ICD-10-CM | POA: Diagnosis not present

## 2021-07-23 DIAGNOSIS — L720 Epidermal cyst: Secondary | ICD-10-CM | POA: Diagnosis not present

## 2021-07-23 DIAGNOSIS — D2261 Melanocytic nevi of right upper limb, including shoulder: Secondary | ICD-10-CM | POA: Diagnosis not present

## 2021-07-23 DIAGNOSIS — Z85828 Personal history of other malignant neoplasm of skin: Secondary | ICD-10-CM | POA: Diagnosis not present

## 2021-07-26 ENCOUNTER — Ambulatory Visit
Admission: RE | Admit: 2021-07-26 | Discharge: 2021-07-26 | Disposition: A | Discharge status: Home / Self Care | Payer: Medicare Other | Source: Ambulatory Visit | Attending: Internal Medicine | Admitting: Internal Medicine

## 2021-07-26 DIAGNOSIS — Z1231 Encounter for screening mammogram for malignant neoplasm of breast: Secondary | ICD-10-CM | POA: Diagnosis not present

## 2021-08-20 ENCOUNTER — Ambulatory Visit (HOSPITAL_COMMUNITY): Payer: Medicare Other

## 2021-08-24 DIAGNOSIS — Z961 Presence of intraocular lens: Secondary | ICD-10-CM | POA: Diagnosis not present

## 2021-08-24 DIAGNOSIS — H04123 Dry eye syndrome of bilateral lacrimal glands: Secondary | ICD-10-CM | POA: Diagnosis not present

## 2021-08-24 DIAGNOSIS — H401231 Low-tension glaucoma, bilateral, mild stage: Secondary | ICD-10-CM | POA: Diagnosis not present

## 2021-08-30 ENCOUNTER — Ambulatory Visit (HOSPITAL_COMMUNITY): Payer: Medicare Other | Attending: Cardiology

## 2021-08-30 ENCOUNTER — Other Ambulatory Visit: Payer: Self-pay

## 2021-08-30 DIAGNOSIS — I4819 Other persistent atrial fibrillation: Secondary | ICD-10-CM | POA: Diagnosis not present

## 2021-08-30 DIAGNOSIS — I341 Nonrheumatic mitral (valve) prolapse: Secondary | ICD-10-CM | POA: Insufficient documentation

## 2021-08-30 DIAGNOSIS — I340 Nonrheumatic mitral (valve) insufficiency: Secondary | ICD-10-CM | POA: Diagnosis not present

## 2021-08-30 DIAGNOSIS — I255 Ischemic cardiomyopathy: Secondary | ICD-10-CM | POA: Diagnosis not present

## 2021-08-30 HISTORY — PX: TRANSTHORACIC ECHOCARDIOGRAM: SHX275

## 2021-08-30 LAB — ECHOCARDIOGRAM COMPLETE
Area-P 1/2: 2.62 cm2
P 1/2 time: 850 msec
S' Lateral: 3.3 cm

## 2021-09-04 ENCOUNTER — Other Ambulatory Visit: Payer: Self-pay

## 2021-09-04 ENCOUNTER — Encounter: Payer: Self-pay | Admitting: Cardiology

## 2021-09-04 ENCOUNTER — Ambulatory Visit: Payer: Medicare Other | Admitting: Cardiology

## 2021-09-04 VITALS — BP 100/56 | HR 54 | Ht 64.0 in | Wt 111.4 lb

## 2021-09-04 DIAGNOSIS — I3139 Other pericardial effusion (noninflammatory): Secondary | ICD-10-CM | POA: Diagnosis not present

## 2021-09-04 DIAGNOSIS — I340 Nonrheumatic mitral (valve) insufficiency: Secondary | ICD-10-CM | POA: Diagnosis not present

## 2021-09-04 DIAGNOSIS — I341 Nonrheumatic mitral (valve) prolapse: Secondary | ICD-10-CM

## 2021-09-04 DIAGNOSIS — I4819 Other persistent atrial fibrillation: Secondary | ICD-10-CM

## 2021-09-04 DIAGNOSIS — Z7901 Long term (current) use of anticoagulants: Secondary | ICD-10-CM

## 2021-09-04 DIAGNOSIS — Z5181 Encounter for therapeutic drug level monitoring: Secondary | ICD-10-CM

## 2021-09-04 DIAGNOSIS — Z79899 Other long term (current) drug therapy: Secondary | ICD-10-CM | POA: Diagnosis not present

## 2021-09-04 DIAGNOSIS — I428 Other cardiomyopathies: Secondary | ICD-10-CM | POA: Diagnosis not present

## 2021-09-04 DIAGNOSIS — D6869 Other thrombophilia: Secondary | ICD-10-CM

## 2021-09-04 MED ORDER — METOPROLOL SUCCINATE ER 25 MG PO TB24: 12.5000 mg | ORAL_TABLET | Freq: Every day | ORAL | 2 refills | Status: DC

## 2021-09-04 NOTE — Patient Instructions (Addendum)
Medication Instructions:     Decrease METOPROLOL  SUCCINATE 12.5 MG  ( 1/2 TABLET DAILY )     IF YOU HAVE BREAKTHROUGH  AFIB  DO NOT TAKE METOPROLOL -   START TAKING  400 MG  ( 2 TABLETS)  AMIODARONE  TWICE A DAY FOR 2 DAYS IF  YOU GO BACK INTO A REGULAR RHYTHM  IN THE FIRST DAY  THEN NEXT DAY  200 MG  DAILY FOR 7 DAYS THEN DECREASE TO 100 MG  DAILY AND TAKE METOPROLOL .  IF  YOU CONTINUE  IN AFIB AFTER THE FIRST 2 DAYS THE  TAKE 200 MG AMIODARONE TWICE A DAY FOR 2 DAYS , THEN DECREASE 200 MG  FOR 7 DAYS  , THEN DECREASE TO 100 MG  DAILY AND RESTART METOPROLOL.  *If you need a refill on your cardiac medications before your next appointment, please call your pharmacy*   Lab Work:Fasting   CMP LIPID Thyroid panel  Sed rate C-RP If you have labs (blood work) drawn today and your tests are completely normal, you will receive your results only by: MyChart Message (if you have MyChart) OR A paper copy in the mail If you have any lab test that is abnormal or we need to change your treatment, we will call you to review the results.   Testing/Procedures: Your physician has recommended that you have a pulmonary function test. Pulmonary Function Tests are a group of tests that measure how well air moves in and out of your lungs.   Follow-Up: At [facility], you and your health needs are our priority.  As part of our continuing mission to provide you with exceptional heart care, we have created designated Provider Care Teams.  These Care Teams include your primary Cardiologist (physician) and Advanced Practice Providers (APPs -  Physician Assistants and Nurse Practitioners) who all work together to provide you with the care you need, when you need it.     Your next appointment:   6 month(s) JULY 2023  The format for your next appointment:   In Person  Provider:   Glenetta Hew, MD  or Sande Rives, PA-C, Kennith Maes, or Jory Sims, DNP, ANP       Other  Instructions    Schedule an annual eye exam- each year you are taking Amiodarone

## 2021-09-04 NOTE — Progress Notes (Signed)
Primary Care Provider: Leeroy Cha, MD Cardiologist: Glenetta Hew, MD Electrophysiologist: None  Clinic Note: Chief Complaint  Patient presents with   Follow-up    Discussed echo results   Atrial Fibrillation    Seems to maintaining sinus rhythm symptomatically.-Only 2 short spells of fluttering since last visit.[name] Converted with amiodarone, did not require DCCV.   Cardiomyopathy    EF improved on echo, but not all the way back to baseline normal.    ===================================  ASSESSMENT/PLAN   Problem List Items Addressed This Visit       Cardiology Problems   Nonischemic cardiomyopathy (HCC) (Chronic)   Relevant Medications   amiodarone (PACERONE) 200 MG tablet   metoprolol succinate (TOPROL XL) 25 MG 24 hr tablet   Other Relevant Orders   EKG 12-Lead (Completed)   Comprehensive metabolic panel (Completed)   Sedimentation rate (Completed)   C-reactive protein (Completed)   Pulmonary Function Test (Completed)   Idiopathic pericardial effusion (Chronic)    Stable effusion.  No signs of inflammation, no signs of tamponade.  Will hold off on further evaluation at this point time, but can consider cardiac MRI to assess if necessary.      Relevant Medications   amiodarone (PACERONE) 200 MG tablet   metoprolol succinate (TOPROL XL) 25 MG 24 hr tablet   Persistent atrial fibrillation (HCC) -> CHA2DS2-VASc score 4 (CHF, aortic plaque, Age 89, female) - Primary (Chronic)    Maintaining sinus rhythm now on amiodarone.  We reduced her to 100 mg daily.  She does have some baseline bradycardia so we will decrease her beta-blocker dose to 12.5 mg.  This patients CHA2DS2-VASc Score and unadjusted Ischemic Stroke Rate (% per year) is equal to 4.8 % stroke rate/year from a score of 4  Above score calculated as 1 point each if present [CHF, HTN, DM, Vascular=MI/PAD/Aortic Plaque, Age if 65-74, or Female] Above score calculated as 2 points each if present [Age  > 75, or Stroke/TIA/TE]  Plan: With improved EF out of A. fib, will refer to Dr. Curt Bears to evaluate for possible ablation.  I would prefer to avoid being on long-term amiodarone. Reduce Toprol dose to 12.5 mg continue 100 mg amiodarone for now. Continue Eliquis current dose.  No bleeding issues. Need to check baseline surveillance labs and studies for amiodarone: Thyroid panel, ESR, CRP and PFTs.   As of now, plan for breakthrough A. Fib:   IF YOU HAVE BREAKTHROUGH  AFIB  DO NOT TAKE METOPROLOL -   START TAKING  400 MG  ( 2 TABLETS)  AMIODARONE  TWICE A DAY FOR 2 DAYS IF  YOU GO BACK INTO A REGULAR RHYTHM  IN THE FIRST DAY  THEN NEXT DAY  200 MG  DAILY FOR 7 DAYS THEN DECREASE TO 100 MG  DAILY AND TAKE METOPROLOL .  IF  YOU CONTINUE  IN AFIB AFTER THE FIRST 2 DAYS THE  TAKE 200 MG AMIODARONE TWICE A DAY FOR 2 DAYS , THEN DECREASE 200 MG  FOR 7 DAYS  , THEN DECREASE TO 100 MG  DAILY AND RESTART METOPROLOL.      Relevant Medications   amiodarone (PACERONE) 200 MG tablet   metoprolol succinate (TOPROL XL) 25 MG 24 hr tablet   Other Relevant Orders   EKG 12-Lead (Completed)   Lipid panel (Completed)   Comprehensive metabolic panel (Completed)   Thyroid Function Panel (THS+T3+T4+Free) (Completed)   Sedimentation rate (Completed)   C-reactive protein (Completed)   Pulmonary Function Test (Completed)  Mitral valve regurgitation due to prolapse of cusp (Chronic)    Follow-up echocardiogram only shows mild MR now that she is no longer in A. fib.  Likely somewhat functional.      Relevant Medications   amiodarone (PACERONE) 200 MG tablet   metoprolol succinate (TOPROL XL) 25 MG 24 hr tablet   Other Relevant Orders   EKG 12-Lead (Completed)   Comprehensive metabolic panel (Completed)   Sedimentation rate (Completed)   C-reactive protein (Completed)     Other   On amiodarone therapy (Chronic)    Started amiodarone to maintain sinus rhythm.  Recheck echocardiogram showed improved  EF being out of A. Fib.  Plan was to avoid long-term amiodarone.  Would prefer A. fib ablation milligrams.  Check baseline PFTs, TFTs, LFTs, ESR and CRP.  Plan: Refer to Dr. Curt Bears.      Relevant Orders   EKG 12-Lead (Completed)   Lipid panel (Completed)   Comprehensive metabolic panel (Completed)   Thyroid Function Panel (THS+T3+T4+Free) (Completed)   Sedimentation rate (Completed)   C-reactive protein (Completed)   Pulmonary Function Test (Completed)   Secondary hypercoagulable state (HCC)-A. fib with CHA2DS2-VASc score 4 (Chronic)    On Eliquis.  No bleeding issues.  Okay to hold Eliquis 2 to 3 days preop for surgeries or procedures. Does not need to be held for 3 days for procedures.      Therapeutic drug monitoring    Monitoring lipids as well as TFTs, LFTs, ESR and CRP as well as PFTs for amiodarone.      Current use of long term anticoagulation: CHA2DS2-VASc score 4 (Chronic)   Relevant Orders   Comprehensive metabolic panel (Completed)   Sedimentation rate (Completed)   C-reactive protein (Completed)  ===================================  HPI:    [name] is a 76 y.o. female with a PMH notable New diagnosis of DILATED CARDIOMYOPATHY identified as part of evaluation for new diagnosis of [place] who presents here for follow-up evaluation after recent relook echocardiogram  [name] was seen for initial evaluation on May 02, 2021 for evaluation of atrial fibrillation.  She only noted fast heart rates or irregular heartbeats when lying down at night.  Otherwise staying active with no major issues.  She noted that her heart rate may take a little longer to slow down after exercise.  No exertional chest tightness or pressure.  Also no exertional dyspnea.  Some resting dyspnea, but also noted exertional fatigue. ->  Her PCP did start Eliquis. She was initially evaluated with an echocardiogram showing severely reduced EF of 25 to  30% (reviewed below) as well as a Zio patch monitor showing 100% persistent A. fib.  With the newly reduced EF, after her October 17 follow-up visit, she was referred for right and left heart catheterization. Low-dose Toprol started.  She was last seen on June 19, 2021 following the cardiac catheterization.  She had had a migraine headache at the time of the cath and was doing better.  She did note a little bit of amiodarone related nausea when she first started taking it.  But otherwise is doing pretty well since the procedure.  She noted that shortly after starting the higher dose of amiodarone, the fluttering in her chest felt better.  Just short little bursts but nothing like the prolonged episodes of irregular heartbeats noted before.  Suggesting that she probably converted to sinus rhythm.  Also noted heart rate was much slower.  She noted a little bit of orthopnea and  edema but no PND.  No real exertional dyspnea.  No chest pain.  No significant fatigue. ->  Essentially feeling better in sinus rhythm.  Recent Hospitalizations:  None since cardiac cath   Reviewed  CV studies:    The following studies were reviewed today: (if available, images/films reviewed: From Epic Chart or Care Everywhere)  TTE 08/30/2021: EF 40 to 45%.  No R WMA.  Moderate pericardial effusion.  Mild MR and AI.  Moderate-severe TR. => Notable improvement from previous EF of 25 to 30%.  Interval History:   [name] returns here to discuss results of her echocardiogram.  All she really can tell is that she is having 1 or 2 episodes of fluttering that have been short-lived.  This is despite the fact that she had COVID over the holidays both her and her husband.  She also noted was having a headache with some nausea and vomiting and just some fatigue.  Did not have any fevers or other illness symptoms.  This did not exacerbate palpitations, nor did she feel any lightheadedness or dizziness.  She is already  recovered from the fatigue that she had and started to feel better.  She has started walking a little bit with her husband.  She denies any chest pain or pressure at rest or exertion.  A little bit deconditioned, but no significant exertional dyspnea.  No PND, or orthopnea, with only trivial edema.  CV Review of Symptoms (Summary) Cardiovascular ROS: no chest pain or dyspnea on exertion positive for - edema, irregular heartbeat, palpitations, and -notably improved.  A little bit of fatigue from COVID, but otherwise doing better.. negative for - edema, loss of consciousness, orthopnea, paroxysmal nocturnal dyspnea, rapid heart rate, shortness of breath, or Near syncope, TIA/amaurosis fugax or claudication  REVIEWED OF SYSTEMS   Review of Systems  Constitutional:  Negative for malaise/fatigue (Recovering from COVID related fatigue but otherwise doing well.) and weight loss.  HENT:  Negative for nosebleeds.   Respiratory:  Negative for cough (Less coughing at night.) and shortness of breath (No real shortness of breath at this point.).   Cardiovascular:  Positive for leg swelling (Trivial end of day swelling.). Negative for claudication.  Gastrointestinal:  Negative for blood in stool and melena.       .  She did have some nausea with some vomiting associated with COVID but not now.  Genitourinary:  Negative for hematuria.  Musculoskeletal:  Positive for joint pain. Negative for myalgias.  Neurological:  Positive for headaches (A lot of headaches when she had COVID but not now.). Negative for dizziness (Off and on, mostly when she first wakes up.) and weakness.  Psychiatric/Behavioral: Negative.  Nervous/anxious: Actually much less anxious since her heart cath, and since she feels like she may not be in atrial fibrillation anymore..    I have reviewed and (if needed) personally updated the patient's problem list, medications, allergies, past medical and surgical history, social and family  history.   PAST MEDICAL HISTORY   Past Medical History:  Diagnosis Date   Dilated cardiomyopathy ([place]) 05/28/2021   NON-ISCHEMIC -> with restoration of sinus rhythm, EF improved from 25-30% up to 40 and 45%.  Moderate-severe TR noted   Diverticulosis 2012   With intermittent diverticulitis   Endometrial cancer ([place]) 2009   S/p vaginal hysterectomy   Family history of adverse reaction to anesthesia    mother ponv   Glaucoma    Hypothyroidism due to thyroiditis treated with radioactive iodine  Idiopathic pericardial effusion 05/16/2021   Migraine    Mitral valve regurgitation due to prolapse of cusp 05/16/2021   Echo: Moderate MR with holosystolic prolapse of the posterior MV   Multiple sclerosis ([place]) 2008   Well-controlled, currently not on medications.  Repatha subcu 3 times weekly from 2008-2021.  No further relapse.   Persistent atrial fibrillation ([place]) 05/02/2021    PAST SURGICAL HISTORY   Past Surgical History:  Procedure Laterality Date   14-day Zio Patch Monitor  05/2021   October 2022 100% A. fib rates ranging from 54 to 178 bpm.  Rare PVCs with occasional couplets.   cataracts Bilateral    COLONOSCOPY WITH PROPOFOL N/A 12/11/2015   Procedure: COLONOSCOPY WITH PROPOFOL;  Surgeon: Garlan Fair, MD;  Location: [facility];  Service: Endoscopy;  Laterality: N/A;   RIGHT/LEFT HEART CATH AND CORONARY ANGIOGRAPHY Bilateral 06/14/2021   Procedure: RIGHT/LEFT HEART CATH AND CORONARY ANGIOGRAPHY;  Surgeon: Leonie Man, MD;  Location: [facility];  Service: Cardiovascular; Angiographic normal coronary arteries.  Cardiac output-index (3.2-2.1). RAP 13/15-12 mmHg, RVP 27/3/10 mmHg; PAP-mean 30/18/23 mmHg, PCWP mean 20 mmHg.  LVEDP-EDP 122/11 mmHg-21 mmHg   salzman nodules  12/11/2014   Had one eye then the other on 01/2015   TONSILLECTOMY     TRANSTHORACIC ECHOCARDIOGRAM  05/16/2021   Severely reduced LV function-EF of 25 to 30%.  Unable assess diastolic  dysfunction due to A. fib.  Mild to moderate LA dilation.  Moderate pericardial effusion.  Moderate MR with holosystolic prolapse of the posterior MV.  Normal RVP and PAP   TRANSTHORACIC ECHOCARDIOGRAM  08/30/2021   EF 40 to 45%.  No R WMA.  Moderate pericardial effusion. Mild MR and AI.  Moderate-severe TR. => Notable improvement from previous EF of 25 to 30%.   VAGINAL HYSTERECTOMY     complete   14-Day Zio Patch Monitor: October 2022 100% A. fib rates ranging from 54 to 178 bpm.  Rare PVCs with occasional couplets. 2D Echo 05/16/2021: Severely reduced LV function-EF of 25 to 30%.  Unable assess diastolic dysfunction due to A. fib.  Mild to moderate LA dilation.  Moderate pericardial effusion.  Moderate MR with holosystolic prolapse of the posterior MV.  Normal RVP and PAP RLCP 06/14/2021: Angiographic normal coronary arteries.  Cardiac output-index (3.2-2.1). RAP 13/15-12 mmHg, RVP 27/3/10 mmHg; PAP-mean 30/18/23 mmHg, PCWP mean 20 mmHg.  LVEDP-EDP 122/11 mmHg-21 mmHg Restart Eliquis & continue current dose Toprol Will add 25 mg losartan Will start amiodarone 400 mg twice daily x7 days, then 20 mg twice daily x7 days then 20 mg daily. ->  We will attempt facilitated DCCV  after being back on [place] for 1 month.  Immunization History  Administered Date(s) Administered   Influenza-Unspecified 06/17/2014   PFIZER(Purple Top)SARS-COV-2 Vaccination 09/22/2019, 10/13/2019, 05/08/2020    MEDICATIONS/ALLERGIES   Current Meds  Medication Sig   acetaminophen (TYLENOL) 500 MG tablet Take 500-1,000 mg by mouth every 6 (six) hours as needed for moderate pain or headache.   amiodarone (PACERONE) 200 MG tablet Take 100 mg by mouth daily.   apixaban (ELIQUIS) 5 MG TABS tablet Take 5 mg by mouth 2 (two) times daily.   b complex vitamins capsule Take 1 capsule by mouth daily.   Biotin 10000 MCG TABS Take 10,000 mcg by mouth daily.   Calcium Carb-Cholecalciferol (CALCIUM 500/VITAMIN D PO) Take 2 each by mouth  daily. Chewables   Cholecalciferol (VITAMIN D) 50 MCG (2000 UT) tablet Take 2,000 Units by  mouth daily.   COLLAGEN PO Take 1 Scoop by mouth daily.   FLUZONE HIGH-DOSE QUADRIVALENT 0.7 ML SUSY    latanoprost (XALATAN) 0.005 % ophthalmic solution Place 1 drop into both eyes at bedtime.   levothyroxine (SYNTHROID, LEVOTHROID) 88 MCG tablet Take 88 mcg by mouth See admin instructions. Takes before breakfast Monday through Friday only.   losartan (COZAAR) 25 MG tablet Take 1 tablet (25 mg total) by mouth daily.   magnesium oxide (MAG-OX) 400 MG tablet Take 400 mg by mouth daily.   Misc Natural Products (GLUCOSAMINE CHOND COMPLEX/MSM PO) Take 1 tablet by mouth daily. Replenex   Prenatal Vit-Fe Fumarate-FA (PRENATAL MULTIVITAMIN) TABS tablet Take 1 tablet by mouth daily.   SUMAtriptan (IMITREX) 100 MG tablet TAKE 1 TABLET BY MOUTH AS NEEDED FOR HEADACHE MAY REPEAT IN 2 HOURS IF HEADACHE PERSIST   Vitamin A 2400 MCG (8000 UT) CAPS Take 8,000 Units by mouth daily.   zinc gluconate 50 MG tablet Take 50 mg by mouth 3 (three) times a week.   [DISCONTINUED] metoprolol succinate (TOPROL XL) 25 MG 24 hr tablet Take 1 tablet (25 mg total) by mouth daily. (Patient taking differently: Take 25 mg by mouth at bedtime.)    Allergies  Allergen Reactions   Penicillins Rash    As a child. Has patient had a PCN reaction causing immediate rash, facial/tongue/throat swelling, SOB or lightheadedness with hypotension: No Has patient had a PCN reaction causing severe rash involving mucus membranes or skin necrosis: No Has patient had a PCN reaction that required hospitalization: No Has patient had a PCN reaction occurring within the last 10 years: No If all of the above answers are "NO", then may proceed with Cephalosporin use.    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   Patient has one child -daughter.>    Patient is retired.   Patient has a college education.   Patient is right-handed.   Patient drinks very little caffeine.   Exercise at the gym 2 to 3 days a week, walks routinely and does intermittent weights.    OBJCTIVE -PE, EKG, labs   Wt Readings from Last 3 Encounters:  09/04/21 111 lb 6.4 oz (50.5 kg)  06/19/21 109 lb 12.8 oz (49.8 kg)  06/14/21 110 lb (49.9 kg)    Physical Exam: BP (!) 100/56    Pulse (!) 54    Ht '5\' 4"'  (1.626 m)    Wt 111 lb 6.4 oz (50.5 kg)    SpO2 97%    BMI 19.12 kg/m  Physical Exam Vitals reviewed.  Constitutional:      Appearance: Normal appearance. She is normal weight. She is not ill-appearing or toxic-appearing.     Comments: Somewhat thin, but not frail.  Well groomed.  Well-nourished.  HENT:     Head: Normocephalic and atraumatic.  Eyes:     Extraocular Movements: Extraocular movements intact.  Neck:     Vascular: No carotid bruit or JVD.  Cardiovascular:  Rate and Rhythm: Regular rhythm. Bradycardia present. Extrasystoles (Not as noticeable) are present.    Chest Wall: PMI is not displaced.     Pulses: Normal pulses and intact distal pulses. Decreased pulses: Decreased with palpable pedal pulses.     Heart sounds: S1 normal and S2 normal. Murmur heard.  High-pitched blowing holosystolic murmur is present with a grade of 1/6 at the lower right sternal border and apex.    No friction rub. No gallop.  Musculoskeletal:     Cervical back: Normal range of motion and neck supple.  Neurological:     Mental Status: She is alert.     Adult ECG Report  Rate: 54 ;  Rhythm: normal sinus rhythm and low voltage. ; Artifact..  Normal axis and  durations.    Narrative Interpretation: Stable sinus bradycardia.   Recent Labs:   09/09/2019: TC 219, TG 52, HDL 88, LDL 122.  02/26/2021: Hgb 14, Cr 0.94, K+ 4.0.  -> Due for lab check  ==================================================  COVID-19 Education: The signs and symptoms of COVID-19 were discussed with the patient and how to seek care for testing (follow up with PCP or arrange E-visit).    I spent a total of 38  minutes with the patient spent in direct patient consultation.  Additional time spent with chart review  / charting (studies, outside notes, etc): 20 min Total Time: 58 min  Current medicines are reviewed at length with the patient today.  (+/- concerns) none  This visit occurred during the SARS-CoV-2 public health emergency.  Safety protocols were in place, including screening questions prior to the visit, additional usage of staff PPE, and extensive cleaning of exam room while observing appropriate contact time as indicated for disinfecting solutions.  Notice: This dictation was prepared with Dragon dictation along with smart phrase technology. Any transcriptional errors that result from this process are unintentional and may not be corrected upon review.  Studies Ordered:   Orders Placed This Encounter  Procedures   Lipid panel   Comprehensive metabolic panel   Thyroid Function Panel (THS+T3+T4+Free)   Sedimentation rate   C-reactive protein   EKG 12-Lead   Pulmonary Function Test     Patient Instructions / Medication Changes & Studies & Tests Ordered   Patient Instructions  Medication Instructions:     Decrease METOPROLOL  SUCCINATE 12.5 MG  ( 1/2 TABLET DAILY )     IF YOU HAVE BREAKTHROUGH  AFIB  DO NOT TAKE METOPROLOL -   START TAKING  400 MG  ( 2 TABLETS)  AMIODARONE  TWICE A DAY FOR 2 DAYS IF  YOU GO BACK INTO A REGULAR RHYTHM  IN THE FIRST DAY  THEN NEXT DAY  200 MG  DAILY FOR 7 DAYS THEN DECREASE TO 100 MG  DAILY AND TAKE METOPROLOL .  IF   YOU CONTINUE  IN AFIB AFTER THE FIRST 2 DAYS THE  TAKE 200 MG AMIODARONE TWICE A DAY FOR 2 DAYS , THEN DECREASE 200 MG  FOR 7 DAYS  , THEN DECREASE TO 100 MG  DAILY AND RESTART METOPROLOL.  *If you need a refill on your cardiac medications before your next appointment, please call your pharmacy*   Lab Work:Fasting   CMP LIPID Thyroid panel  Sed rate C-RP If you have labs (blood work) drawn today and your tests are completely normal, you will receive your results only by: [place] (if you have MyChart) OR A paper copy in the mail If you have any  lab test that is abnormal or we need to change your treatment, we will call you to review the results.   Testing/Procedures: Your physician has recommended that you have a pulmonary function test. Pulmonary Function Tests are a group of tests that measure how well air moves in and out of your lungs.   Follow-Up: At [facility] you and your health needs are our priority.  As part of our continuing mission to provide you with exceptional heart care, we have created designated Provider Care Teams.  These Care Teams include your primary Cardiologist (physician) and Advanced Practice Providers (APPs -  Physician Assistants and Nurse Practitioners) who all work together to provide you with the care you need, when you need it.     Your next appointment:   6 month(s) JULY 2023  The format for your next appointment:   In Person  Provider:   Glenetta Hew, MD  or Sande Rives, PA-C, Kennith Maes, or Jory Sims, DNP, ANP       Other Instructions    Schedule an annual eye exam- each year you are taking Amiodarone     Glenetta Hew, M.D., M.S. Interventional Cardiologist   Pager # [phone] Phone # [phone] [address] [place], [place] [address]   Thank you for choosing Heartcare at [facility]

## 2021-09-07 DIAGNOSIS — I340 Nonrheumatic mitral (valve) insufficiency: Secondary | ICD-10-CM | POA: Diagnosis not present

## 2021-09-07 DIAGNOSIS — Z79899 Other long term (current) drug therapy: Secondary | ICD-10-CM | POA: Diagnosis not present

## 2021-09-07 DIAGNOSIS — I341 Nonrheumatic mitral (valve) prolapse: Secondary | ICD-10-CM | POA: Diagnosis not present

## 2021-09-07 DIAGNOSIS — I4819 Other persistent atrial fibrillation: Secondary | ICD-10-CM | POA: Diagnosis not present

## 2021-09-07 DIAGNOSIS — I428 Other cardiomyopathies: Secondary | ICD-10-CM | POA: Diagnosis not present

## 2021-09-10 ENCOUNTER — Ambulatory Visit (HOSPITAL_COMMUNITY)
Admission: RE | Admit: 2021-09-10 | Discharge: 2021-09-10 | Disposition: A | Discharge status: Home / Self Care | Payer: Medicare Other | Source: Ambulatory Visit | Attending: Cardiology | Admitting: Cardiology

## 2021-09-10 ENCOUNTER — Other Ambulatory Visit: Payer: Self-pay

## 2021-09-10 DIAGNOSIS — I428 Other cardiomyopathies: Secondary | ICD-10-CM | POA: Insufficient documentation

## 2021-09-10 DIAGNOSIS — E039 Hypothyroidism, unspecified: Secondary | ICD-10-CM | POA: Diagnosis not present

## 2021-09-10 DIAGNOSIS — Z79899 Other long term (current) drug therapy: Secondary | ICD-10-CM | POA: Insufficient documentation

## 2021-09-10 DIAGNOSIS — I480 Paroxysmal atrial fibrillation: Secondary | ICD-10-CM | POA: Diagnosis not present

## 2021-09-10 DIAGNOSIS — I4819 Other persistent atrial fibrillation: Secondary | ICD-10-CM | POA: Insufficient documentation

## 2021-09-10 DIAGNOSIS — M85859 Other specified disorders of bone density and structure, unspecified thigh: Secondary | ICD-10-CM | POA: Diagnosis not present

## 2021-09-10 DIAGNOSIS — R946 Abnormal results of thyroid function studies: Secondary | ICD-10-CM | POA: Diagnosis not present

## 2021-09-10 DIAGNOSIS — Z0000 Encounter for general adult medical examination without abnormal findings: Secondary | ICD-10-CM | POA: Diagnosis not present

## 2021-09-10 LAB — PULMONARY FUNCTION TEST
DL/VA % pred: 66 %
DL/VA: 2.72 ml/min/mmHg/L
DLCO unc % pred: 45 %
DLCO unc: 8.88 ml/min/mmHg
FEF 25-75 Post: 0.48 L/sec
FEF 25-75 Pre: 0.98 L/sec
FEF2575-%Change-Post: -50 %
FEF2575-%Pred-Post: 27 %
FEF2575-%Pred-Pre: 54 %
FEV1-%Change-Post: -26 %
FEV1-%Pred-Post: 63 %
FEV1-%Pred-Pre: 86 %
FEV1-Post: 1.42 L
FEV1-Pre: 1.93 L
FEV1FVC-%Change-Post: -22 %
FEV1FVC-%Pred-Pre: 96 %
FEV6-%Change-Post: -5 %
FEV6-%Pred-Post: 88 %
FEV6-%Pred-Pre: 93 %
FEV6-Post: 2.5 L
FEV6-Pre: 2.64 L
FEV6FVC-%Change-Post: 0 %
FEV6FVC-%Pred-Post: 104 %
FEV6FVC-%Pred-Pre: 103 %
FVC-%Change-Post: -5 %
FVC-%Pred-Post: 85 %
FVC-%Pred-Pre: 90 %
FVC-Post: 2.52 L
FVC-Pre: 2.67 L
Post FEV1/FVC ratio: 56 %
Post FEV6/FVC ratio: 99 %
Pre FEV1/FVC ratio: 72 %
Pre FEV6/FVC Ratio: 99 %
RV % pred: 94 %
RV: 2.15 L
TLC % pred: 84 %
TLC: 4.32 L

## 2021-09-10 MED ORDER — ALBUTEROL SULFATE (2.5 MG/3ML) 0.083% IN NEBU
2.5000 mg | INHALATION_SOLUTION | Freq: Once | RESPIRATORY_TRACT | Status: AC
  Administered 2021-09-10: 2.5 mg via RESPIRATORY_TRACT

## 2021-09-11 ENCOUNTER — Encounter: Payer: Self-pay | Admitting: Cardiology

## 2021-09-11 DIAGNOSIS — R942 Abnormal results of pulmonary function studies: Secondary | ICD-10-CM

## 2021-09-11 DIAGNOSIS — I4819 Other persistent atrial fibrillation: Secondary | ICD-10-CM

## 2021-09-11 DIAGNOSIS — Z79899 Other long term (current) drug therapy: Secondary | ICD-10-CM

## 2021-09-12 ENCOUNTER — Encounter: Payer: Self-pay | Admitting: Cardiology

## 2021-09-12 DIAGNOSIS — D6869 Other thrombophilia: Secondary | ICD-10-CM | POA: Insufficient documentation

## 2021-09-12 NOTE — Assessment & Plan Note (Signed)
On Eliquis.  No bleeding issues.   Okay to hold Eliquis 2 to 3 days preop for surgeries or procedures.  Does not need to be held for 3 days for procedures.

## 2021-09-12 NOTE — Telephone Encounter (Signed)
Sent message for Dr Ellyn Hack to review

## 2021-09-12 NOTE — Assessment & Plan Note (Signed)
Stable effusion.  No signs of inflammation, no signs of tamponade.  Will hold off on further evaluation at this point time, but can consider cardiac MRI to assess if necessary.

## 2021-09-12 NOTE — Assessment & Plan Note (Signed)
Monitoring lipids as well as TFTs, LFTs, ESR and CRP as well as PFTs for amiodarone.

## 2021-09-12 NOTE — Telephone Encounter (Signed)
The result from  pulmonary  function test ° ° °Very interesting results noted on lung function test.  °Somewhat contradictory compared to the sed rate (ESR) and CRP.    ° °This study suggests minimal obstructive airway disease (minimal COPD) with no significant response to a bronchodilator such as albuterol.  But there did appear to be was a significant reduction in diffusing capacity.  This would be concerning for possible effect of amiodarone.  ° °At this point I think we probably should stop amiodarone, and place urgent referral to pulmonary medicine to see if they can interpret this further.  Would also like to refer to Dr. Camnitz from electrophysiology to see if there is any other options other than amiodarone to treat atrial fibrillation.,  If there is indeed concern for amiodarone toxicity.  ° °Recommendations:  °Stop Amiodarone  °Place referral to Pulmonary Medicine  °Place Referral to Dr. Camnitz (Electrophysiology).  ° °David Harding, MD  09/10/21 ° °   °

## 2021-09-12 NOTE — Telephone Encounter (Signed)
Sent message to patient .  Referral  sent to EP - Dr Curt Bears and  Velora Heckler pulm

## 2021-09-12 NOTE — Assessment & Plan Note (Addendum)
Maintaining sinus rhythm now on amiodarone.  We reduced her to 100 mg daily.  She does have some baseline bradycardia so we will decrease her beta-blocker dose to 12.5 mg.  This patients CHA2DS2-VASc Score and unadjusted Ischemic Stroke Rate (% per year) is equal to 4.8 % stroke rate/year from a score of 4  Above score calculated as 1 point each if present [CHF, HTN, DM, Vascular=MI/PAD/Aortic Plaque, Age if 65-74, or Female] Above score calculated as 2 points each if present [Age > 75, or Stroke/TIA/TE]  Plan:  With improved EF out of A. fib, will refer to Dr. Curt Bears to evaluate for possible ablation.  I would prefer to avoid being on long-term amiodarone.  Reduce Toprol dose to 12.5 mg continue 100 mg amiodarone for now.  Continue Eliquis current dose.  No bleeding issues.  Need to check baseline surveillance labs and studies for amiodarone:  Thyroid panel, ESR, CRP and PFTs.   As of now, plan for breakthrough A. Fib:   IF YOU HAVE BREAKTHROUGH  AFIB  DO NOT TAKE METOPROLOL -   START TAKING  400 MG  ( 2 TABLETS)  AMIODARONE  TWICE A DAY FOR 2 DAYS IF  YOU GO BACK INTO A REGULAR RHYTHM  IN THE FIRST DAY  THEN NEXT DAY  200 MG  DAILY FOR 7 DAYS THEN DECREASE TO 100 MG  DAILY AND TAKE METOPROLOL .  IF  YOU CONTINUE  IN AFIB AFTER THE FIRST 2 DAYS THE  TAKE 200 MG AMIODARONE TWICE A DAY FOR 2 DAYS , THEN DECREASE 200 MG  FOR 7 DAYS  , THEN DECREASE TO 100 MG  DAILY AND RESTART METOPROLOL.

## 2021-09-12 NOTE — Assessment & Plan Note (Signed)
Started amiodarone to maintain sinus rhythm.  Recheck echocardiogram showed improved EF being out of A. Fib.  Plan was to avoid long-term amiodarone.  Would prefer A. fib ablation milligrams.  Check baseline PFTs, TFTs, LFTs, ESR and CRP.  Plan: Refer to Dr. Curt Bears.

## 2021-09-12 NOTE — Assessment & Plan Note (Signed)
Follow-up echocardiogram only shows mild MR now that she is no longer in A. fib.  Likely somewhat functional.

## 2021-09-17 LAB — LIPID PANEL
Chol/HDL Ratio: 2.3 ratio (ref 0.0–4.4)
Cholesterol, Total: 212 mg/dL — ABNORMAL HIGH (ref 100–199)
HDL: 93 mg/dL (ref >39)
LDL Chol Calc (NIH): 109 mg/dL — ABNORMAL HIGH (ref 0–99)
Triglycerides: 54 mg/dL (ref 0–149)
VLDL Cholesterol Cal: 10 mg/dL (ref 5–40)

## 2021-09-17 LAB — TSH+T3+FREE T4+T3 FREE
Free T-3: 1.7 pg/mL — ABNORMAL LOW
Free T4 by Dialysis: 1.2 ng/dL
TSH: 10 uU/mL — ABNORMAL HIGH
Triiodothyronine (T-3), Serum: 71 ng/dL

## 2021-09-17 LAB — COMPREHENSIVE METABOLIC PANEL
ALT: 15 IU/L (ref 0–32)
AST: 25 IU/L (ref 0–40)
Albumin/Globulin Ratio: 1.8 (ref 1.2–2.2)
Albumin: 4.4 g/dL (ref 3.7–4.7)
Alkaline Phosphatase: 116 IU/L (ref 44–121)
BUN/Creatinine Ratio: 16 (ref 12–28)
BUN: 16 mg/dL (ref 8–27)
Bilirubin Total: 0.4 mg/dL (ref 0.0–1.2)
CO2: 27 mmol/L (ref 20–29)
Calcium: 9.7 mg/dL (ref 8.7–10.3)
Chloride: 100 mmol/L (ref 96–106)
Creatinine, Ser: 1 mg/dL (ref 0.57–1.00)
Globulin, Total: 2.4 g/dL (ref 1.5–4.5)
Glucose: 89 mg/dL (ref 70–99)
Potassium: 4.2 mmol/L (ref 3.5–5.2)
Sodium: 140 mmol/L (ref 134–144)
Total Protein: 6.8 g/dL (ref 6.0–8.5)
eGFR: 59 mL/min/{1.73_m2} — ABNORMAL LOW (ref >59)

## 2021-09-17 LAB — SEDIMENTATION RATE: Sed Rate: 19 mm/hr (ref 0–40)

## 2021-09-17 LAB — C-REACTIVE PROTEIN: CRP: 8 mg/L (ref 0–10)

## 2021-09-19 ENCOUNTER — Encounter: Payer: Self-pay | Admitting: Cardiology

## 2021-09-19 ENCOUNTER — Encounter: Payer: Self-pay | Admitting: *Deleted

## 2021-09-19 ENCOUNTER — Ambulatory Visit: Payer: Medicare Other | Admitting: Cardiology

## 2021-09-19 ENCOUNTER — Other Ambulatory Visit: Payer: Self-pay

## 2021-09-19 VITALS — BP 110/60 | HR 52 | Ht 64.0 in | Wt 110.0 lb

## 2021-09-19 DIAGNOSIS — I3139 Other pericardial effusion (noninflammatory): Secondary | ICD-10-CM

## 2021-09-19 DIAGNOSIS — I5022 Chronic systolic (congestive) heart failure: Secondary | ICD-10-CM | POA: Diagnosis not present

## 2021-09-19 DIAGNOSIS — I428 Other cardiomyopathies: Secondary | ICD-10-CM

## 2021-09-19 DIAGNOSIS — I4819 Other persistent atrial fibrillation: Secondary | ICD-10-CM | POA: Diagnosis not present

## 2021-09-19 NOTE — Progress Notes (Signed)
Electrophysiology Office Note:    Date:  09/19/2021   ID:  [name], DOB 03-09-1946, MRN [id number]  PCP:  Leeroy Cha, MD  [facility] Cardiologist:  Glenetta Hew, MD  [facility] Electrophysiologist:  Vickie Epley, MD   Referring MD: Leonie Man, MD   Chief Complaint: Atrial fibrillation  History of Present Illness:    [name] is a 76 y.o. female who presents for an evaluation of atrial fibrillation at the request of Dr. Ellyn Hack. Their medical history includes chronic systolic heart failure, moderate to severe tricuspid regurgitation, endometrial cancer, pericardial effusion, multiple sclerosis.  The patient last saw Dr. Ellyn Hack September 04, 2021.  At that time the patient was taking amiodarone for her atrial fibrillation and was maintaining normal rhythm.  Since that time she has had some dysregulation of her thyroid and abnormal pulmonary function test necessitating the stoppage of amiodarone.  She presents to discuss treatment options for her A-fib.  She was relatively asymptomatic when she previously was in atrial fibrillation.  She is with her husband today in clinic.     Past Medical History:  Diagnosis Date   Dilated cardiomyopathy ([place]) 05/28/2021   NON-ISCHEMIC -> with restoration of sinus rhythm, EF improved from 25-30% up to 40 and 45%.  Moderate-severe TR noted   Diverticulosis 2012   With intermittent diverticulitis   Endometrial cancer ([place]) 2009   S/p vaginal hysterectomy   Family history of adverse reaction to anesthesia    mother ponv   Glaucoma    Hypothyroidism due to thyroiditis treated with radioactive iodine    Idiopathic pericardial effusion 05/16/2021   Migraine    Mitral valve regurgitation due to prolapse of cusp 05/16/2021   Echo: Moderate MR with holosystolic prolapse of the posterior MV   Multiple sclerosis ([place]) 2008   Well-controlled, currently not on medications.  Repatha subcu 3 times  weekly from 2008-2021.  No further relapse.   Persistent atrial fibrillation ([place]) 05/02/2021    Past Surgical History:  Procedure Laterality Date   14-day Zio Patch Monitor  05/2021   October 2022 100% A. fib rates ranging from 54 to 178 bpm.  Rare PVCs with occasional couplets.   cataracts Bilateral    COLONOSCOPY WITH PROPOFOL N/A 12/11/2015   Procedure: COLONOSCOPY WITH PROPOFOL;  Surgeon: Garlan Fair, MD;  Location: [facility];  Service: Endoscopy;  Laterality: N/A;   RIGHT/LEFT HEART CATH AND CORONARY ANGIOGRAPHY Bilateral 06/14/2021   Procedure: RIGHT/LEFT HEART CATH AND CORONARY ANGIOGRAPHY;  Surgeon: Leonie Man, MD;  Location: [facility];  Service: Cardiovascular; Angiographic normal coronary arteries.  Cardiac output-index (3.2-2.1). RAP 13/15-12 mmHg, RVP 27/3/10 mmHg; PAP-mean 30/18/23 mmHg, PCWP mean 20 mmHg.  LVEDP-EDP 122/11 mmHg-21 mmHg   salzman nodules  12/11/2014   Had one eye then the other on 01/2015   TONSILLECTOMY     TRANSTHORACIC ECHOCARDIOGRAM  05/16/2021   Severely reduced LV function-EF of 25 to 30%.  Unable assess diastolic dysfunction due to A. fib.  Mild to moderate LA dilation.  Moderate pericardial effusion.  Moderate MR with holosystolic prolapse of the posterior MV.  Normal RVP and PAP   TRANSTHORACIC ECHOCARDIOGRAM  08/30/2021   EF 40 to 45%.  No R WMA.  Moderate pericardial effusion. Mild MR and AI.  Moderate-severe TR. => Notable improvement from previous EF of 25 to 30%.   VAGINAL HYSTERECTOMY     complete    Current Medications: Current Meds  Medication Sig  acetaminophen (TYLENOL) 500 MG tablet Take 500-1,000 mg by mouth every 6 (six) hours as needed for moderate pain or headache.   apixaban (ELIQUIS) 5 MG TABS tablet Take 5 mg by mouth 2 (two) times daily.   b complex vitamins capsule Take 1 capsule by mouth daily.   Biotin 10000 MCG TABS Take 10,000 mcg by mouth daily.   Calcium Carb-Cholecalciferol (CALCIUM 500/VITAMIN  D PO) Take 2 each by mouth daily. Chewables   Cholecalciferol (VITAMIN D) 50 MCG (2000 UT) tablet Take 2,000 Units by mouth daily.   COLLAGEN PO Take 1 Scoop by mouth daily.   FLUZONE HIGH-DOSE QUADRIVALENT 0.7 ML SUSY    latanoprost (XALATAN) 0.005 % ophthalmic solution Place 1 drop into both eyes at bedtime.   levothyroxine (SYNTHROID) 100 MCG tablet Take 100 mcg by mouth every morning.   losartan (COZAAR) 25 MG tablet Take 1 tablet (25 mg total) by mouth daily.   magnesium oxide (MAG-OX) 400 MG tablet Take 400 mg by mouth daily.   metoprolol succinate (TOPROL XL) 25 MG 24 hr tablet Take 0.5 tablets (12.5 mg total) by mouth daily.   Misc Natural Products (GLUCOSAMINE CHOND COMPLEX/MSM PO) Take 1 tablet by mouth daily. Replenex   Prenatal Vit-Fe Fumarate-FA (PRENATAL MULTIVITAMIN) TABS tablet Take 1 tablet by mouth daily.   SUMAtriptan (IMITREX) 100 MG tablet TAKE 1 TABLET BY MOUTH AS NEEDED FOR HEADACHE MAY REPEAT IN 2 HOURS IF HEADACHE PERSIST   tretinoin (RETIN-A) 0.025 % cream Apply 1 application topically at bedtime.   Vitamin A 2400 MCG (8000 UT) CAPS Take 8,000 Units by mouth daily.   zinc gluconate 50 MG tablet Take 50 mg by mouth 3 (three) times a week.     Allergies:   Penicillins and Codeine   Social History   Socioeconomic History   Marital status: Married    Spouse name: [name]   Number of children: 1   Years of education: 14   Highest education level: Not on file  Occupational History    Employer: [employer]  Tobacco Use   Smoking status: Never   Smokeless tobacco: Never  Substance and Sexual Activity   Alcohol use: Yes    Alcohol/week: 1.0 standard drink    Types: 1 Glasses of wine per week    Comment: wine on occ   Drug use: No   Sexual activity: Not on file  Other Topics Concern   Not on file  Social History Narrative   [name], 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   Patient has one child -daughter.>    Patient is retired.   Patient has a college  education.   Patient is right-handed.   Patient drinks very little caffeine.   Exercise at the gym 2 to 3 days a week, walks routinely and does intermittent weights.   Social Determinants of Health   Financial Resource Strain: Not on file  Food Insecurity: Not on file  Transportation Needs: Not on file  Physical Activity: Not on file  Stress: Not on file  Social Connections: Not on file     Family History: The patient's family history includes Congestive Heart Failure (age of onset: 63) in her mother; Fainting in her father; Migraines in her sister; Pneumonia in her father; Stroke (age of onset: 48) in her mother.  ROS:   Please see the history of present illness.    All other systems reviewed and are negative.  EKGs/Labs/Other Studies Reviewed:    The following studies were  reviewed today:  August 30, 2021 echo personally reviewed Moderate pericardial effusion Left ventricular function moderately decreased, 40% At least mild MR Moderate to severe TR Mild to moderate aortic regurgitation   EKG:  The ekg ordered today demonstrates sinus rhythm.  Ventricular rate 52 bpm.  PAC.   Recent Labs: 05/30/2021: Hemoglobin 13.8; Platelets 201 09/07/2021: ALT 15; BUN 16; Creatinine, Ser 1.00; Potassium 4.2; Sodium 140  Recent Lipid Panel    Component Value Date/Time   CHOL 212 (H) 09/07/2021 0914   TRIG 54 09/07/2021 0914   HDL 93 09/07/2021 0914   CHOLHDL 2.3 09/07/2021 0914   CHOLHDL 2.5 05/25/2007 0409   VLDL 4 05/25/2007 0409   LDLCALC 109 (H) 09/07/2021 0914    Physical Exam:    VS:  BP 110/60 (BP Location: Left Arm, Patient Position: Sitting, Cuff Size: Normal)    Pulse (!) 52    Ht 5\' 4"  (1.626 m)    Wt 110 lb (49.9 kg)    SpO2 98%    BMI 18.88 kg/m     Wt Readings from Last 3 Encounters:  09/19/21 110 lb (49.9 kg)  09/04/21 111 lb 6.4 oz (50.5 kg)  06/19/21 109 lb 12.8 oz (49.8 kg)     GEN:  Well nourished, well developed in no acute distress.   Elderly. HEENT: Normal NECK: No JVD; No carotid bruits LYMPHATICS: No lymphadenopathy CARDIAC: RRR, no murmurs, rubs, gallops RESPIRATORY:  Clear to auscultation without rales, wheezing or rhonchi  ABDOMEN: Soft, non-tender, non-distended MUSCULOSKELETAL:  No edema; No deformity  SKIN: Warm and dry NEUROLOGIC:  Alert and oriented x 3 PSYCHIATRIC:  Normal affect       ASSESSMENT:    1. Nonischemic cardiomyopathy (H[facility])   2. Persistent atrial fibrillation ([place])   3. Chronic systolic heart failure ([place])   4. Pericardial effusion    PLAN:    In order of problems listed above:  #Chronic systolic heart failure #Nonischemic cardiomyopathy NYHA class II.  Warm dry on exam.  Given her history of A-fib with rapid ventricular rates, it was suspected that her cardiomyopathy could be driven by tachycardia which I think is a plausible explanation.  I think it will be important for her to maintain normal rhythm.  Thankfully after her amiodarone trial, she is maintaining normal rhythm.  Unfortunately his medicine had to be stopped because of thyroid dysregulation and abnormal PFTs.  We would not be to restart amiodarone.  If A-fib becomes an issue moving forward, would favor transitioning to dofetilide.  We will need to let the amiodarone wash out of her system for at least 4 to 6 months.  This was discussed in depth with the patient and her husband during today's visit.  Given her at least moderate pericardial effusion with unclear cause, she is not a candidate for catheter ablation.  I think this needs to be further investigated.  #Pericardial effusion Concerning given no clear cause.  She has relatively low amplitude QRS on today's EKG.  In the setting of an effusion, valvular disease, cardiomyopathy and her age, could be a unifying diagnosis such as cardiac amyloidosis that could explain her pericardial effusion.  I have ordered a cardiac MRI to further investigate.  If this is negative, consider  pericardiocentesis for diagnostic purposes.  The presence of a significant pericardial effusion increases the risk of perforation during catheter ablation and thus, she is not a candidate at this time.  She will follow-up with me after her cardiac MRI.  Total time spent with patient today 48 minutes. This includes reviewing records, evaluating the patient and coordinating care.  Medication Adjustments/Labs and Tests Ordered: Current medicines are reviewed at length with the patient today.  Concerns regarding medicines are outlined above.  Orders Placed This Encounter  Procedures   MR CARDIAC MORPHOLOGY W WO CONTRAST   CBC   EKG 12-Lead   No orders of the defined types were placed in this encounter.    Signed, Hilton Cork. Quentin Ore, MD, [facility], [facility] 09/19/2021 4:53 PM    Electrophysiology [facility]

## 2021-09-19 NOTE — Patient Instructions (Signed)
Medications: Your physician recommends that you continue on your current medications as directed. Please refer to the Current Medication list given to you today. *If you need a refill on your cardiac medications before your next appointment, please call your pharmacy*  Lab Work: CBC If you have labs (blood work) drawn today and your tests are completely normal, you will receive your results only by: [place] (if you have MyChart) OR A paper copy in the mail If you have any lab test that is abnormal or we need to change your treatment, we will call you to review the results.  Testing/Procedures: Your physician has requested that you have a cardiac MRI. Cardiac MRI uses a computer to create images of your heart as its beating, producing both still and moving pictures of your heart and major blood vessels. For further information please visit [email or web address] Please follow the instruction sheet given to you today for more information.   Follow-Up: At [facility], you and your health needs are our priority.  As part of our continuing mission to provide you with exceptional heart care, we have created designated Provider Care Teams.  These Care Teams include your primary Cardiologist (physician) and Advanced Practice Providers (APPs -  Physician Assistants and Nurse Practitioners) who all work together to provide you with the care you need, when you need it.  Your physician wants you to follow-up in: 6 months with Dr. Lars Mage.  We recommend signing up for the patient portal called "MyChart".  Sign up information is provided on this After Visit Summary.  MyChart is used to connect with patients for Virtual Visits (Telemedicine).  Patients are able to view lab/test results, encounter notes, upcoming appointments, etc.  Non-urgent messages can be sent to your provider as well.   To learn more about what you can do with MyChart, go to [email or web address].    Any Other Special  Instructions Will Be Listed Below (If Applicable).

## 2021-09-19 NOTE — Telephone Encounter (Signed)
Yep - I saw.  Another reason to stop Amiodarone.    This should improve - but may require increased synthroid dose for a while.   Amiodarone is wonderful when it doesn't have adverse effects, but really requires following closely.     Glenetta Hew, MD     Patient is aware of Dr Allison Quarry comment Patient had appointment with Dr Elaina Pattee 09/19/21  Appt with [facility] next week schedule

## 2021-09-20 LAB — CBC
Hematocrit: 35.7 % (ref 34.0–46.6)
Hemoglobin: 12.2 g/dL (ref 11.1–15.9)
MCH: 34.3 pg — ABNORMAL HIGH (ref 26.6–33.0)
MCHC: 34.2 g/dL (ref 31.5–35.7)
MCV: 100 fL — ABNORMAL HIGH (ref 79–97)
Platelets: 232 10*3/uL (ref 150–450)
RBC: 3.56 x10E6/uL — ABNORMAL LOW (ref 3.77–5.28)
RDW: 13 % (ref 11.7–15.4)
WBC: 5.6 10*3/uL (ref 3.4–10.8)

## 2021-09-27 ENCOUNTER — Encounter: Payer: Self-pay | Admitting: Student

## 2021-09-27 ENCOUNTER — Other Ambulatory Visit: Payer: Self-pay

## 2021-09-27 ENCOUNTER — Ambulatory Visit: Payer: Medicare Other | Admitting: Student

## 2021-09-27 VITALS — BP 112/66 | HR 58 | Temp 98.2°F | Ht 64.5 in | Wt 109.0 lb

## 2021-09-27 DIAGNOSIS — R942 Abnormal results of pulmonary function studies: Secondary | ICD-10-CM

## 2021-09-27 DIAGNOSIS — I480 Paroxysmal atrial fibrillation: Secondary | ICD-10-CM | POA: Diagnosis not present

## 2021-09-27 NOTE — Patient Instructions (Signed)
-   You will be called for ct chest, send me a message or call if you'd like to discuss results  -

## 2021-09-27 NOTE — Progress Notes (Signed)
Synopsis: Referred for abnormal PFT by Leonie Man, MD  Subjective:   PATIENT ID: [name] GENDER: female DOB: 07/17/1946, MRN: [id number]  Chief Complaint  Patient presents with   Pulmonary Consult    Referred by Dr Ellyn Hack for eval of abnormal PFT's. She states she occ feels some pressure in her chest. She was on Amiodarone for 3 months and d/c'ed in Jan 2023. She has occ cough, non prod.    76yF with history of NICM, moderate MR, endometrial cancer s/p hysterectomy did get chemoXRT, pAF, MS   She'd been started on amiodarone in November for AF. Was loaded with amiodarone and then gradually decreased dose to 100 mg daily. Stopped 09/10/21 after reviewing PFT.  She says that she frequently has sensation of chest pressure, central, nonexertional - has had for about 6 months. Has sensation of needing to take deep breath frequently at night. No fever. Some fatigue but no worse after being on amiodarone. No DOE - surprisingly she actually thinks she feels better and less dyspneic with exertion than at rest.   No cough.   Otherwise pertinent review of systems is negative.  She has no family history of lung disease  She worked for [employer], retired 2008. She has no pets at home. Has never lived outside of [place] apart from a brief period of time in [place].     Past Medical History:  Diagnosis Date   Dilated cardiomyopathy ([place]) 05/28/2021   NON-ISCHEMIC -> with restoration of sinus rhythm, EF improved from 25-30% up to 40 and 45%.  Moderate-severe TR noted   Diverticulosis 2012   With intermittent diverticulitis   Endometrial cancer ([place]) 2009   S/p vaginal hysterectomy   Family history of adverse reaction to anesthesia    mother ponv   Glaucoma    Hypothyroidism due to thyroiditis treated with radioactive iodine    Idiopathic pericardial effusion 05/16/2021   Migraine    Mitral valve regurgitation due to prolapse of cusp 05/16/2021   Echo: Moderate MR  with holosystolic prolapse of the posterior MV   Multiple sclerosis ([place]) 2008   Well-controlled, currently not on medications.  Repatha subcu 3 times weekly from 2008-2021.  No further relapse.   Persistent atrial fibrillation ([place]) 05/02/2021     Family History  Problem Relation Age of Onset   Stroke Mother 60       lived 52 yrs   Congestive Heart Failure Mother 17   Fainting Father    Pneumonia Father        Thought to be aspiration pneumonia following an episode of syncope.   Migraines Sister      Past Surgical History:  Procedure Laterality Date   14-day Zio Patch Monitor  05/2021   October 2022 100% A. fib rates ranging from 54 to 178 bpm.  Rare PVCs with occasional couplets.   cataracts Bilateral    COLONOSCOPY WITH PROPOFOL N/A 12/11/2015   Procedure: COLONOSCOPY WITH PROPOFOL;  Surgeon: Garlan Fair, MD;  Location: [facility];  Service: Endoscopy;  Laterality: N/A;   RIGHT/LEFT HEART CATH AND CORONARY ANGIOGRAPHY Bilateral 06/14/2021   Procedure: RIGHT/LEFT HEART CATH AND CORONARY ANGIOGRAPHY;  Surgeon: Leonie Man, MD;  Location: [facility];  Service: Cardiovascular; Angiographic normal coronary arteries.  Cardiac output-index (3.2-2.1). RAP 13/15-12 mmHg, RVP 27/3/10 mmHg; PAP-mean 30/18/23 mmHg, PCWP mean 20 mmHg.  LVEDP-EDP 122/11 mmHg-21 mmHg   salzman nodules  12/11/2014   Had one eye then the other  on 01/2015   TONSILLECTOMY     TRANSTHORACIC ECHOCARDIOGRAM  05/16/2021   Severely reduced LV function-EF of 25 to 30%.  Unable assess diastolic dysfunction due to A. fib.  Mild to moderate LA dilation.  Moderate pericardial effusion.  Moderate MR with holosystolic prolapse of the posterior MV.  Normal RVP and PAP   TRANSTHORACIC ECHOCARDIOGRAM  08/30/2021   EF 40 to 45%.  No R WMA.  Moderate pericardial effusion. Mild MR and AI.  Moderate-severe TR. => Notable improvement from previous EF of 25 to 30%.   VAGINAL HYSTERECTOMY     complete    Social  History   Socioeconomic History   Marital status: Married    Spouse name: Scientist, physiological   Number of children: 1   Years of education: 14   Highest education level: Not on file  Occupational History    Employer: [employer]  Tobacco Use   Smoking status: Never   Smokeless tobacco: Never  Vaping Use   Vaping Use: Never used  Substance and Sexual Activity   Alcohol use: Yes    Alcohol/week: 1.0 standard drink    Types: 1 Glasses of wine per week    Comment: wine on occ   Drug use: No   Sexual activity: Not on file  Other Topics Concern   Not on file  Social History Narrative   [name], 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   Patient has one child -daughter.>    Patient is retired.   Patient has a college education.   Patient is right-handed.   Patient drinks very little caffeine.   Exercise at the gym 2 to 3 days a week, walks routinely and does intermittent weights.   Social Determinants of Health   Financial Resource Strain: Not on file  Food Insecurity: Not on file  Transportation Needs: Not on file  Physical Activity: Not on file  Stress: Not on file  Social Connections: Not on file  Intimate Partner Violence: Not on file     Allergies  Allergen Reactions   Penicillins Rash    As a child. Has patient had a PCN reaction causing immediate rash, facial/tongue/throat swelling, SOB or lightheadedness with hypotension: No Has patient had a PCN reaction causing severe rash involving mucus membranes or skin necrosis: No Has patient had a PCN reaction that required hospitalization: No Has patient had a PCN reaction occurring within the last 10 years: No If all of the above answers are "NO", then may proceed with Cephalosporin use.    Codeine Nausea And Vomiting     Outpatient Medications Prior to Visit  Medication Sig Dispense Refill   acetaminophen (TYLENOL) 500 MG tablet Take 500-1,000 mg by mouth every 6 (six) hours as needed for moderate pain or headache.      apixaban (ELIQUIS) 5 MG TABS tablet Take 5 mg by mouth 2 (two) times daily.     b complex vitamins capsule Take 1 capsule by mouth daily.     Biotin 10000 MCG TABS Take 10,000 mcg by mouth daily.     Calcium Carb-Cholecalciferol (CALCIUM 500/VITAMIN D PO) Take 2 each by mouth daily. Chewables     Cholecalciferol (VITAMIN D) 50 MCG (2000 UT) tablet Take 2,000 Units by mouth daily.     COLLAGEN PO Take 1 Scoop by mouth daily.     FLUZONE HIGH-DOSE QUADRIVALENT 0.7 ML SUSY      latanoprost (XALATAN) 0.005 % ophthalmic solution Place 1 drop into both eyes at bedtime.  6   levothyroxine (SYNTHROID) 100 MCG tablet Take 100 mcg by mouth every morning.     losartan (COZAAR) 25 MG tablet Take 1 tablet (25 mg total) by mouth daily. 30 tablet 11   magnesium oxide (MAG-OX) 400 MG tablet Take 400 mg by mouth daily.     metoprolol succinate (TOPROL XL) 25 MG 24 hr tablet Take 0.5 tablets (12.5 mg total) by mouth daily. 45 tablet 2   Misc Natural Products (GLUCOSAMINE CHOND COMPLEX/MSM PO) Take 1 tablet by mouth daily. Replenex     Prenatal Vit-Fe Fumarate-FA (PRENATAL MULTIVITAMIN) TABS tablet Take 1 tablet by mouth daily.     SUMAtriptan (IMITREX) 100 MG tablet TAKE 1 TABLET BY MOUTH AS NEEDED FOR HEADACHE MAY REPEAT IN 2 HOURS IF HEADACHE PERSIST 10 tablet 3   Vitamin A 2400 MCG (8000 UT) CAPS Take 8,000 Units by mouth daily.     zinc gluconate 50 MG tablet Take 50 mg by mouth 3 (three) times a week.     levothyroxine (SYNTHROID, LEVOTHROID) 88 MCG tablet Take 88 mcg by mouth See admin instructions. Takes before breakfast Monday through Friday only.     tretinoin (RETIN-A) 0.025 % cream Apply 1 application topically at bedtime.     No facility-administered medications prior to visit.       Objective:   Physical Exam:  General appearance: 76 y.o., female, NAD, conversant  Eyes: anicteric sclerae; PERRL, tracking appropriately HENT: NCAT; MMM Neck: Trachea midline; no lymphadenopathy, no  JVD Lungs: CTAB, no crackles, no wheeze, with normal respiratory effort CV: RRR, no murmur  Abdomen: Soft, non-tender; non-distended, BS present  Extremities: No peripheral edema, warm Skin: Normal turgor and texture; no rash Psych: Appropriate affect Neuro: Alert and oriented to person and place, no focal deficit     Vitals:   09/27/21 1542  BP: 112/66  Pulse: (!) 58  Temp: 98.2 F (36.8 C)  TempSrc: Oral  SpO2: 99%  Weight: 109 lb (49.4 kg)  Height: 5' 4.5" (1.638 m)   99% on RA BMI Readings from Last 3 Encounters:  09/27/21 18.42 kg/m  09/19/21 18.88 kg/m  09/04/21 19.12 kg/m   Wt Readings from Last 3 Encounters:  09/27/21 109 lb (49.4 kg)  09/19/21 110 lb (49.9 kg)  09/04/21 111 lb 6.4 oz (50.5 kg)     CBC    Component Value Date/Time   WBC 5.6 09/19/2021 1404   WBC 4.6 12/05/2009 1308   RBC 3.56 (L) 09/19/2021 1404   RBC 3.60 (L) 12/05/2009 1308   HGB 12.2 09/19/2021 1404   HCT 35.7 09/19/2021 1404   PLT 232 09/19/2021 1404   MCV 100 (H) 09/19/2021 1404   MCH 34.3 (H) 09/19/2021 1404   MCHC 34.2 09/19/2021 1404   MCHC 35.3 12/05/2009 1308   RDW 13.0 09/19/2021 1404   LYMPHSABS 1.6 06/08/2020 1404   MONOABS 0.5 05/24/2007 1216   EOSABS 0.2 06/08/2020 1404   BASOSABS 0.0 06/08/2020 1404    Chest Imaging: No prior chest imaging available for review  Pulmonary Functions Testing Results: PFT Results Latest Ref Rng & Units 09/10/2021  FVC-Pre L 2.67  FVC-Predicted Pre % 90  FVC-Post L 2.52  FVC-Predicted Post % 85  Pre FEV1/FVC % % 72  Post FEV1/FCV % % 56  FEV1-Pre L 1.93  FEV1-Predicted Pre % 86  FEV1-Post L 1.42  DLCO uncorrected ml/min/mmHg 8.88  DLCO UNC% % 45  DLVA Predicted % 66  TLC L 4.32  TLC % Predicted %  84  RV % Predicted % 94   Reviewed by me with moderately severe diffusing capacity, severity of which may be overestimated by failure to reach 90% IVC during maneuver  Echocardiogram:   TTE  Left ventricular ejection  fraction, by estimation, is 40 to 45%. The left ventricle has mildly decreased function. The left ventricle has no regional wall motion abnormalities. Left ventricular diastolic parameters are indeterminate. 1. Right ventricular systolic function is normal. The right ventricular size is normal. There is normal pulmonary artery systolic pressure. 2. Moderate pericardial effusion. The pericardial effusion is circumferential. There is no evidence of cardiac tamponade. 3. The mitral valve is normal in structure. Mild mitral valve regurgitation. No evidence of mitral stenosis. 4. 5. Tricuspid valve regurgitation is moderate to severe. The aortic valve is normal in structure. Aortic valve regurgitation is mild. No aortic stenosis is present. 6. The inferior vena cava is normal in size with greater than 50% respiratory variability, suggesting right atrial pressure of 3 mmHg.     Assessment & Plan:   # Decreased diffusing capacity: # Amiodarone exposure:  Sounds like she's overall doing fairly well from functional standpoint with relatively little DOE, cough. While she probably does have decreased diffusing capacity the severity is probably overestimated by her failure to reach 90% IVC during the maneuver. Unsure what the cause is. If she does have ILD it may have been caused by amio (although this would be a little early for her dose), or it may have been subclinical and preceded or have been worsened by amio exposure. There is no other clear cause of ILD by history other than possibly her chemotherapeutics in past for endometrial cancer.   Plan: - HRCT Chest  RTC 4 weeks to discuss      Maryjane Hurter, MD [facility] 09/27/2021 3:53 PM

## 2021-10-02 ENCOUNTER — Ambulatory Visit
Admission: RE | Admit: 2021-10-02 | Discharge: 2021-10-02 | Disposition: A | Discharge status: Home / Self Care | Payer: Medicare Other | Source: Ambulatory Visit | Attending: Internal Medicine | Admitting: Internal Medicine

## 2021-10-02 DIAGNOSIS — M8580 Other specified disorders of bone density and structure, unspecified site: Secondary | ICD-10-CM

## 2021-10-02 DIAGNOSIS — M8589 Other specified disorders of bone density and structure, multiple sites: Secondary | ICD-10-CM | POA: Diagnosis not present

## 2021-10-02 DIAGNOSIS — Z780 Asymptomatic menopausal state: Secondary | ICD-10-CM | POA: Diagnosis not present

## 2021-10-12 ENCOUNTER — Ambulatory Visit
Admission: RE | Admit: 2021-10-12 | Discharge: 2021-10-12 | Disposition: A | Discharge status: Home / Self Care | Payer: Medicare Other | Source: Ambulatory Visit | Attending: Student | Admitting: Student

## 2021-10-12 DIAGNOSIS — I700 Atherosclerosis of aorta: Secondary | ICD-10-CM | POA: Diagnosis not present

## 2021-10-12 DIAGNOSIS — R0689 Other abnormalities of breathing: Secondary | ICD-10-CM | POA: Diagnosis not present

## 2021-10-12 DIAGNOSIS — R942 Abnormal results of pulmonary function studies: Secondary | ICD-10-CM

## 2021-10-12 DIAGNOSIS — J479 Bronchiectasis, uncomplicated: Secondary | ICD-10-CM | POA: Diagnosis not present

## 2021-10-16 DIAGNOSIS — I480 Paroxysmal atrial fibrillation: Secondary | ICD-10-CM | POA: Diagnosis not present

## 2021-10-16 DIAGNOSIS — E039 Hypothyroidism, unspecified: Secondary | ICD-10-CM | POA: Diagnosis not present

## 2021-10-23 ENCOUNTER — Telehealth (HOSPITAL_COMMUNITY): Payer: Self-pay | Admitting: *Deleted

## 2021-10-23 NOTE — Telephone Encounter (Signed)
Attempted to call patient regarding upcoming cardiac MRI appointment. Left message on voicemail with name and callback number  Makiyla Linch RN Navigator Cardiac Imaging [facility] [phone] Office [phone] Cell  

## 2021-10-23 NOTE — Telephone Encounter (Signed)
Reaching out to patient to offer assistance regarding upcoming cardiac imaging study; pt verbalizes understanding of appt date/time, parking situation and where to check in, and verified current allergies; name and call back number provided for further questions should they arise  Shakera Ebrahimi RN Navigator Cardiac Imaging [facility] [phone] office [phone] cell  Patient denies metal or claustrophobia. 

## 2021-10-24 ENCOUNTER — Other Ambulatory Visit: Payer: Self-pay

## 2021-10-24 ENCOUNTER — Ambulatory Visit (HOSPITAL_COMMUNITY)
Admission: RE | Admit: 2021-10-24 | Discharge: 2021-10-24 | Disposition: A | Discharge status: Home / Self Care | Payer: Medicare Other | Source: Ambulatory Visit | Attending: Cardiology | Admitting: Cardiology

## 2021-10-24 DIAGNOSIS — I428 Other cardiomyopathies: Secondary | ICD-10-CM

## 2021-10-26 ENCOUNTER — Other Ambulatory Visit: Payer: Self-pay

## 2021-10-26 ENCOUNTER — Ambulatory Visit (HOSPITAL_COMMUNITY)
Admission: RE | Admit: 2021-10-26 | Discharge: 2021-10-26 | Disposition: A | Discharge status: Home / Self Care | Payer: Medicare Other | Source: Ambulatory Visit | Attending: Cardiology | Admitting: Cardiology

## 2021-10-26 DIAGNOSIS — I428 Other cardiomyopathies: Secondary | ICD-10-CM | POA: Diagnosis not present

## 2021-10-26 MED ORDER — GADOBUTROL 1 MMOL/ML IV SOLN
10.0000 mL | Freq: Once | INTRAVENOUS | Status: AC | PRN
  Administered 2021-10-26: 10 mL via INTRAVENOUS

## 2021-10-27 HISTORY — PX: OTHER SURGICAL HISTORY: SHX169

## 2021-10-29 ENCOUNTER — Encounter: Payer: Self-pay | Admitting: Cardiology

## 2021-10-29 NOTE — Progress Notes (Signed)
? ?Synopsis: Referred for abnormal PFT by Leeroy Cha,* ? ?Subjective:  ? ?PATIENT ID: [name] GENDER: female DOB: 1946/02/12, MRN: [id number] ? ?Chief Complaint  ?Patient presents with  ? Follow-up  ?  Breathing is overall doing well. Still feels chest pressure on occ.  No new co's today.   ? ?75yF with history of NICM, moderate MR, endometrial cancer s/p hysterectomy did get chemoXRT, pAF on eliquis since 05/2022, MS  ? ?She'd been started on amiodarone in November for AF. Was loaded with amiodarone and then gradually decreased dose to 100 mg daily. Stopped 09/10/21 after reviewing PFT. ? ?She says that she frequently has sensation of chest pressure, central, nonexertional - has had for about 6 months. Has sensation of needing to take deep breath frequently at night. No fever. Some fatigue but no worse after being on amiodarone. No DOE - surprisingly she actually thinks she feels better and less dyspneic with exertion than at rest.  ? ?No cough.  ? ?Otherwise pertinent review of systems is negative. ? ?She has no family history of lung disease ? ?She worked for [employer], retired 2008. She has no pets at home. Has never lived outside of [place] apart from a brief period of time in [place].  ? ? ?Interval HPI: ?Seen by me 2/16 at which point ordered HRCT chest to evaluate for possibility of amiodarone related or other ILD given decreased diffusing capacity on PFT. HRCT with minute amount of scar in lingula, RML, bases more likely to be related to prior infection or subclinical infection with NTM than anything else. ? ?She is feeling overall pretty well - no real change since last visit. Similar chest pressure, central, nonexertional. No productive cough, fever, drenching night sweats.  ? ?Otherwise pertinent review of systems is negative. ? ?Past Medical History:  ?Diagnosis Date  ? Dilated cardiomyopathy ([place]) 05/28/2021  ? NON-ISCHEMIC -> with restoration of sinus rhythm, EF improved  from 25-30% up to 40 and 45%.  Moderate-severe TR noted  ? Diverticulosis 2012  ? With intermittent diverticulitis  ? Endometrial cancer [facility]) 2009  ? S/p vaginal hysterectomy  ? Family history of adverse reaction to anesthesia   ? mother ponv  ? Glaucoma   ? Hypothyroidism due to thyroiditis treated with radioactive iodine   ? Idiopathic pericardial effusion 05/16/2021  ? Migraine   ? Mitral valve regurgitation due to prolapse of cusp 05/16/2021  ? Echo: Moderate MR with holosystolic prolapse of the posterior MV  ? Multiple sclerosis ([place]) 2008  ? Well-controlled, currently not on medications.  Repatha subcu 3 times weekly from 2008-2021.  No further relapse.  ? Persistent atrial fibrillation ([place]) 05/02/2021  ?  ? ?Family History  ?Problem Relation Age of Onset  ? Stroke Mother 51  ?     lived 68 yrs  ? Congestive Heart Failure Mother 102  ? Fainting Father   ? Pneumonia Father   ?     Thought to be aspiration pneumonia following an episode of syncope.  ? Migraines Sister   ?  ? ?Past Surgical History:  ?Procedure Laterality Date  ? 14-day Zio Patch Monitor  05/2021  ? October 2022 100% A. fib rates ranging from 54 to 178 bpm.  Rare PVCs with occasional couplets.  ? cataracts Bilateral   ? COLONOSCOPY WITH PROPOFOL N/A 12/11/2015  ? Procedure: COLONOSCOPY WITH PROPOFOL;  Surgeon: Garlan Fair, MD;  [facility]: [facility];  Service: Endoscopy;  Laterality: N/A;  ? RIGHT/LEFT HEART CATH  AND CORONARY ANGIOGRAPHY Bilateral 06/14/2021  ? Procedure: RIGHT/LEFT HEART CATH AND CORONARY ANGIOGRAPHY;  Surgeon: Leonie Man, MD;  Location: [facility];  Service: Cardiovascular; Angiographic normal coronary arteries.  Cardiac output-index (3.2-2.1). RAP 13/15-12 mmHg, RVP 27/3/10 mmHg; PAP-mean 30/18/23 mmHg, PCWP mean 20 mmHg.  LVEDP-EDP 122/11 mmHg-21 mmHg  ? salzman nodules  12/11/2014  ? Had one eye then the other on 01/2015  ? TONSILLECTOMY    ? TRANSTHORACIC ECHOCARDIOGRAM  05/16/2021  ? Severely reduced  LV function-EF of 25 to 30%.  Unable assess diastolic dysfunction due to A. fib.  Mild to moderate LA dilation.  Moderate pericardial effusion.  Moderate MR with holosystolic prolapse of the posterior MV.  Normal RVP and PAP  ? TRANSTHORACIC ECHOCARDIOGRAM  08/30/2021  ? EF 40 to 45%.  No R WMA.  Moderate pericardial effusion. Mild MR and AI.  Moderate-severe TR. => Notable improvement from previous EF of 25 to 30%.  ? VAGINAL HYSTERECTOMY    ? complete  ? ? ?Social History  ? ?Socioeconomic History  ? Marital status: Married  ?  Spouse name: [name]  ? Number of children: 1  ? Years of education: 66  ? Highest education level: Not on file  ?Occupational History  ?  Employer: [employer]  ?Tobacco Use  ? Smoking status: Never  ? Smokeless tobacco: Never  ?Vaping Use  ? Vaping Use: Never used  ?Substance and Sexual Activity  ? Alcohol use: Yes  ?  Alcohol/week: 1.0 standard drink  ?  Types: 1 Glasses of wine per week  ?  Comment: wine on occ  ? Drug use: No  ? Sexual activity: Not on file  ?Other Topics Concern  ? Not on file  ?Social History Narrative  ? 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  ? Patient has one child -daughter.>   ? Patient is retired.  ? Patient has a college education.  ? Patient is right-handed.  ? Patient drinks very little caffeine.  ? Exercise at the gym 2 to 3 days a week, walks routinely and does intermittent weights.  ? ?Social Determinants of Health  ? ?Financial Resource Strain: Not on file  ?Food Insecurity: Not on file  ?Transportation Needs: Not on file  ?Physical Activity: Not on file  ?Stress: Not on file  ?Social Connections: Not on file  ?Intimate Partner Violence: Not on file  ?  ? ?Allergies  ?Allergen Reactions  ? Penicillins Rash  ?  As a child. ?Has patient had a PCN reaction causing immediate rash, facial/tongue/throat swelling, SOB or lightheadedness with hypotension: No ?Has patient had a PCN reaction causing severe rash involving mucus membranes or skin necrosis:  No ?Has patient had a PCN reaction that required hospitalization: No ?Has patient had a PCN reaction occurring within the last 10 years: No ?If all of the above answers are "NO", then may proceed with Cephalosporin use. ?  ? Codeine Nausea And Vomiting  ?  ? ?Outpatient Medications Prior to Visit  ?Medication Sig Dispense Refill  ? acetaminophen (TYLENOL) 500 MG tablet Take 500-1,000 mg by mouth every 6 (six) hours as needed for moderate pain or headache.    ? apixaban (ELIQUIS) 5 MG TABS tablet Take 5 mg by mouth 2 (two) times daily.    ? b complex vitamins capsule Take 1 capsule by mouth daily.    ? Biotin 10000 MCG TABS Take 10,000 mcg by mouth daily.    ? Calcium Carb-Cholecalciferol (CALCIUM  500/VITAMIN D PO) Take 2 each by mouth daily. Chewables    ? Cholecalciferol (VITAMIN D) 50 MCG (2000 UT) tablet Take 2,000 Units by mouth daily.    ? COLLAGEN PO Take 1 Scoop by mouth daily.    ? FLUZONE HIGH-DOSE QUADRIVALENT 0.7 ML SUSY     ? latanoprost (XALATAN) 0.005 % ophthalmic solution Place 1 drop into both eyes at bedtime.  6  ? levothyroxine (SYNTHROID) 100 MCG tablet Take 100 mcg by mouth every morning.    ? losartan (COZAAR) 25 MG tablet Take 1 tablet (25 mg total) by mouth daily. 30 tablet 11  ? magnesium oxide (MAG-OX) 400 MG tablet Take 400 mg by mouth daily.    ? metoprolol succinate (TOPROL XL) 25 MG 24 hr tablet Take 0.5 tablets (12.5 mg total) by mouth daily. 45 tablet 2  ? Misc Natural Products (GLUCOSAMINE CHOND COMPLEX/MSM PO) Take 1 tablet by mouth daily. Replenex    ? Prenatal Vit-Fe Fumarate-FA (PRENATAL MULTIVITAMIN) TABS tablet Take 1 tablet by mouth daily.    ? SUMAtriptan (IMITREX) 100 MG tablet TAKE 1 TABLET BY MOUTH AS NEEDED FOR HEADACHE MAY REPEAT IN 2 HOURS IF HEADACHE PERSIST 10 tablet 3  ? Vitamin A 2400 MCG (8000 UT) CAPS Take 8,000 Units by mouth daily.    ? zinc gluconate 50 MG tablet Take 50 mg by mouth 3 (three) times a week.    ? ?No facility-administered medications prior to  visit.  ? ? ? ? ? ?Objective:  ? ?Physical Exam: ? ?General appearance: 76 y.o., female, NAD, conversant  ?Eyes: anicteric sclerae; PERRL, tracking appropriately ?HENT: NCAT; MMM ?Neck: Trachea midline; n

## 2021-10-30 ENCOUNTER — Ambulatory Visit: Payer: Medicare Other | Admitting: Student

## 2021-10-30 ENCOUNTER — Other Ambulatory Visit: Payer: Self-pay

## 2021-10-30 ENCOUNTER — Telehealth: Payer: Self-pay | Admitting: Cardiology

## 2021-10-30 ENCOUNTER — Encounter: Payer: Self-pay | Admitting: Student

## 2021-10-30 VITALS — BP 104/60 | HR 67 | Temp 98.1°F | Ht 64.5 in | Wt 110.6 lb

## 2021-10-30 DIAGNOSIS — R942 Abnormal results of pulmonary function studies: Secondary | ICD-10-CM

## 2021-10-30 MED ORDER — ALBUTEROL SULFATE HFA 108 (90 BASE) MCG/ACT IN AERS: 2.0000 | INHALATION_SPRAY | Freq: Four times a day (QID) | RESPIRATORY_TRACT | 6 refills | Status: DC | PRN

## 2021-10-30 NOTE — Telephone Encounter (Signed)
Spoke to husband (DPR) about appointment and Dr. Quentin Ore not reviewing the MRI yet. Advised I will call the patient back when I have the final result and will move follow up appointment if needed at that time.  ?Verbalized understanding.  ?

## 2021-10-30 NOTE — Patient Instructions (Addendum)
-   you will be called to schedule ventilation/perfusion scan to make sure there's no lung blood clot ?- you can try albuterol inhaler 1-2 puffs if you feel that chest pressure/tightness sensation. If it makes your heart rate jump to 130+ sustained then would avoid using it. ?- see you in a month! ?

## 2021-10-30 NOTE — Telephone Encounter (Signed)
Patient would like to know if she should be seen before August to get her MRI resutls. Please call and advise.

## 2021-10-30 NOTE — Telephone Encounter (Signed)
Follow phone call please ?

## 2021-10-31 ENCOUNTER — Ambulatory Visit: Payer: Medicare Other

## 2021-10-31 ENCOUNTER — Other Ambulatory Visit: Payer: Self-pay | Admitting: Student

## 2021-10-31 DIAGNOSIS — R942 Abnormal results of pulmonary function studies: Secondary | ICD-10-CM

## 2021-11-06 ENCOUNTER — Other Ambulatory Visit: Payer: Self-pay

## 2021-11-06 ENCOUNTER — Ambulatory Visit
Admission: RE | Admit: 2021-11-06 | Discharge: 2021-11-06 | Disposition: A | Discharge status: Home / Self Care | Payer: Medicare Other | Source: Ambulatory Visit | Attending: Student | Admitting: Student

## 2021-11-06 ENCOUNTER — Encounter
Admission: RE | Admit: 2021-11-06 | Discharge: 2021-11-06 | Disposition: A | Discharge status: Home / Self Care | Payer: Medicare Other | Source: Ambulatory Visit | Attending: Student | Admitting: Student

## 2021-11-06 DIAGNOSIS — R942 Abnormal results of pulmonary function studies: Secondary | ICD-10-CM | POA: Insufficient documentation

## 2021-11-06 DIAGNOSIS — R918 Other nonspecific abnormal finding of lung field: Secondary | ICD-10-CM | POA: Diagnosis not present

## 2021-11-06 MED ORDER — TECHNETIUM TO 99M ALBUMIN AGGREGATED
4.0000 | Freq: Once | INTRAVENOUS | Status: AC | PRN
  Administered 2021-11-06: 4.18 via INTRAVENOUS

## 2021-11-13 DIAGNOSIS — H905 Unspecified sensorineural hearing loss: Secondary | ICD-10-CM | POA: Diagnosis not present

## 2021-11-28 NOTE — Progress Notes (Signed)
? ?Synopsis: Referred for abnormal PFT by Leeroy Cha,* ? ?Subjective:  ? ?PATIENT ID: [name] GENDER: female DOB: 09-Dec-1945, MRN: [id number] ? ?Chief Complaint  ?Patient presents with  ? Follow-up  ?  Breathing is doing well and no new co's.   ? ?75yF with history of NICM, moderate MR, endometrial cancer s/p hysterectomy did get chemoXRT, pAF on eliquis since 05/2022, MS  ? ?She'd been started on amiodarone in November for AF. Was loaded with amiodarone and then gradually decreased dose to 100 mg daily. Stopped 09/10/21 after reviewing PFT. ? ?She says that she frequently has sensation of chest pressure, central, nonexertional - has had for about 6 months. Has sensation of needing to take deep breath frequently at night. No fever. Some fatigue but no worse after being on amiodarone. No DOE - surprisingly she actually thinks she feels better and less dyspneic with exertion than at rest.  ? ?No cough.  ? ?Otherwise pertinent review of systems is negative. ? ?She has no family history of lung disease ? ?She worked for [employer], retired 2008. She has no pets at home. Has never lived outside of [place] apart from a brief period of time in [place].  ? ? ?Interval HPI: ?Negative VQ scan. No trouble with DOE. Occasional tickle but doesn't have cough that's bothersome. No fever, drenching night sweats. ? ?Otherwise pertinent review of systems is negative. ? ?Past Medical History:  ?Diagnosis Date  ? Dilated cardiomyopathy ([place]) 05/28/2021  ? NON-ISCHEMIC -> with restoration of sinus rhythm, EF improved from 25-30% up to 40 and 45%.  Moderate-severe TR noted  ? Diverticulosis 2012  ? With intermittent diverticulitis  ? Endometrial cancer [facility]) 2009  ? S/p vaginal hysterectomy  ? Family history of adverse reaction to anesthesia   ? mother ponv  ? Glaucoma   ? Hypothyroidism due to thyroiditis treated with radioactive iodine   ? Idiopathic pericardial effusion 05/16/2021  ? Migraine   ? Mitral  valve regurgitation due to prolapse of cusp 05/16/2021  ? Echo: Moderate MR with holosystolic prolapse of the posterior MV  ? Multiple sclerosis ([place]) 2008  ? Well-controlled, currently not on medications.  Repatha subcu 3 times weekly from 2008-2021.  No further relapse.  ? Persistent atrial fibrillation ([place]) 05/02/2021  ?  ? ?Family History  ?Problem Relation Age of Onset  ? Stroke Mother 42  ?     lived 89 yrs  ? Congestive Heart Failure Mother 20  ? Fainting Father   ? Pneumonia Father   ?     Thought to be aspiration pneumonia following an episode of syncope.  ? Migraines Sister   ?  ? ?Past Surgical History:  ?Procedure Laterality Date  ? 14-day Zio Patch Monitor  05/2021  ? October 2022 100% A. fib rates ranging from 54 to 178 bpm.  Rare PVCs with occasional couplets.  ? cataracts Bilateral   ? COLONOSCOPY WITH PROPOFOL N/A 12/11/2015  ? Procedure: COLONOSCOPY WITH PROPOFOL;  Surgeon: Garlan Fair, MD;  Location: [facility];  Service: Endoscopy;  Laterality: N/A;  ? RIGHT/LEFT HEART CATH AND CORONARY ANGIOGRAPHY Bilateral 06/14/2021  ? Procedure: RIGHT/LEFT HEART CATH AND CORONARY ANGIOGRAPHY;  Surgeon: Leonie Man, MD;  Location: [place] [facility];  Service: Cardiovascular; Angiographic normal coronary arteries.  Cardiac output-index (3.2-2.1). RAP 13/15-12 mmHg, RVP 27/3/10 mmHg; PAP-mean 30/18/23 mmHg, PCWP mean 20 mmHg.  LVEDP-EDP 122/11 mmHg-21 mmHg  ? salzman nodules  12/11/2014  ? Had one eye then  the other on 01/2015  ? TONSILLECTOMY    ? TRANSTHORACIC ECHOCARDIOGRAM  05/16/2021  ? Severely reduced LV function-EF of 25 to 30%.  Unable assess diastolic dysfunction due to A. fib.  Mild to moderate LA dilation.  Moderate pericardial effusion.  Moderate MR with holosystolic prolapse of the posterior MV.  Normal RVP and PAP  ? TRANSTHORACIC ECHOCARDIOGRAM  08/30/2021  ? EF 40 to 45%.  No R WMA.  Moderate pericardial effusion. Mild MR and AI.  Moderate-severe TR. => Notable improvement from  previous EF of 25 to 30%.  ? VAGINAL HYSTERECTOMY    ? complete  ? ? ?Social History  ? ?Socioeconomic History  ? Marital status: Married  ?  Spouse name: [name]  ? Number of children: 1  ? Years of education: 66  ? Highest education level: Not on file  ?Occupational History  ?  Employer: [employer]  ?Tobacco Use  ? Smoking status: Never  ? Smokeless tobacco: Never  ?Vaping Use  ? Vaping Use: Never used  ?Substance and Sexual Activity  ? Alcohol use: Yes  ?  Alcohol/week: 1.0 standard drink  ?  Types: 1 Glasses of wine per week  ?  Comment: wine on occ  ? Drug use: No  ? Sexual activity: Not on file  ?Other Topics Concern  ? Not on file  ?Social History Narrative  ? 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  ? Patient has one child -daughter.>   ? Patient is retired.  ? Patient has a college education.  ? Patient is right-handed.  ? Patient drinks very little caffeine.  ? Exercise at the gym 2 to 3 days a week, walks routinely and does intermittent weights.  ? ?Social Determinants of Health  ? ?Financial Resource Strain: Not on file  ?Food Insecurity: Not on file  ?Transportation Needs: Not on file  ?Physical Activity: Not on file  ?Stress: Not on file  ?Social Connections: Not on file  ?Intimate Partner Violence: Not on file  ?  ? ?Allergies  ?Allergen Reactions  ? Penicillins Rash  ?  As a child. ?Has patient had a PCN reaction causing immediate rash, facial/tongue/throat swelling, SOB or lightheadedness with hypotension: No ?Has patient had a PCN reaction causing severe rash involving mucus membranes or skin necrosis: No ?Has patient had a PCN reaction that required hospitalization: No ?Has patient had a PCN reaction occurring within the last 10 years: No ?If all of the above answers are "NO", then may proceed with Cephalosporin use. ?  ? Codeine Nausea And Vomiting  ?  ? ?Outpatient Medications Prior to Visit  ?Medication Sig Dispense Refill  ? acetaminophen (TYLENOL) 500 MG tablet Take 500-1,000 mg by  mouth every 6 (six) hours as needed for moderate pain or headache.    ? albuterol (VENTOLIN HFA) 108 (90 Base) MCG/ACT inhaler Inhale 2 puffs into the lungs every 6 (six) hours as needed for wheezing or shortness of breath. 8 g 6  ? apixaban (ELIQUIS) 5 MG TABS tablet Take 5 mg by mouth 2 (two) times daily.    ? b complex vitamins capsule Take 1 capsule by mouth daily.    ? Biotin 10000 MCG TABS Take 10,000 mcg by mouth daily.    ? Calcium Carb-Cholecalciferol (CALCIUM 500/VITAMIN D PO) Take 2 each by mouth daily. Chewables    ? Cholecalciferol (VITAMIN D) 50 MCG (2000 UT) tablet Take 2,000 Units by mouth daily.    ? COLLAGEN PO Take 1  Scoop by mouth daily.    ? FLUZONE HIGH-DOSE QUADRIVALENT 0.7 ML SUSY     ? latanoprost (XALATAN) 0.005 % ophthalmic solution Place 1 drop into both eyes at bedtime.  6  ? levothyroxine (SYNTHROID) 100 MCG tablet Take 100 mcg by mouth every morning.    ? losartan (COZAAR) 25 MG tablet Take 1 tablet (25 mg total) by mouth daily. 30 tablet 11  ? magnesium oxide (MAG-OX) 400 MG tablet Take 400 mg by mouth daily.    ? metoprolol succinate (TOPROL XL) 25 MG 24 hr tablet Take 0.5 tablets (12.5 mg total) by mouth daily. 45 tablet 2  ? Misc Natural Products (GLUCOSAMINE CHOND COMPLEX/MSM PO) Take 1 tablet by mouth daily. Replenex    ? Prenatal Vit-Fe Fumarate-FA (PRENATAL MULTIVITAMIN) TABS tablet Take 1 tablet by mouth daily.    ? SUMAtriptan (IMITREX) 100 MG tablet TAKE 1 TABLET BY MOUTH AS NEEDED FOR HEADACHE MAY REPEAT IN 2 HOURS IF HEADACHE PERSIST 10 tablet 3  ? Vitamin A 2400 MCG (8000 UT) CAPS Take 8,000 Units by mouth daily.    ? zinc gluconate 50 MG tablet Take 50 mg by mouth 3 (three) times a week.    ? ?No facility-administered medications prior to visit.  ? ? ? ? ? ?Objective:  ? ?Physical Exam: ? ?General appearance: 76 y.o., female, NAD, conversant  ?Eyes: anicteric sclerae; PERRL, tracking appropriately ?HENT: NCAT; MMM ?Neck: Trachea midline; no lymphadenopathy, no  JVD ?Lungs: CTAB, no crackles, no wheeze, with normal respiratory effort ?CV: RRR, no murmur  ?Abdomen: Soft, non-tender; non-distended, BS present  ?Extremities: No peripheral edema, warm ?Skin: Normal turgor and t

## 2021-11-30 ENCOUNTER — Ambulatory Visit: Payer: Medicare Other | Admitting: Student

## 2021-11-30 ENCOUNTER — Encounter: Payer: Self-pay | Admitting: Student

## 2021-11-30 VITALS — BP 102/60 | HR 71 | Temp 97.6°F | Ht 64.5 in | Wt 110.0 lb

## 2021-11-30 DIAGNOSIS — R942 Abnormal results of pulmonary function studies: Secondary | ICD-10-CM

## 2021-11-30 NOTE — Patient Instructions (Signed)
-   If any trouble down the road with breathing or cough that's unexplained we would be glad to see you in clinic again. Just call or send my chart message to schedule an appointment.  ?

## 2021-12-09 NOTE — Progress Notes (Signed)
?Electrophysiology Office Follow up Visit Note:   ? ?Date:  12/12/2021  ? ?ID:  [name], DOB 1946/05/05, MRN [id number] ? ?PCP:  Leeroy Cha, MD  ?[facility] Cardiologist:  Glenetta Hew, MD  ?[facility] Electrophysiologist:  Vickie Epley, MD  ? ? ?Interval History:   ? ?[name] is a 76 y.o. female who presents for a follow up visit. They were last seen in clinic September 19, 2021.  Her medical history includes chronic systolic heart failure, moderate to severe tricuspid regurgitation, endometrial cancer, pericardial effusion and multiple sclerosis.  At that appointment I did not think she is a good candidate for catheter ablation of her atrial fibrillation because of a moderate-sized pericardial effusion.  I ordered a cardiac MRI to further investigate. ? ?Today she is with her husband in clinic who I previously met.  She has been ill the last week or so with a cough.  No fevers.  COVID-negative.  She has had increasing burden of atrial fibrillation with heart rates in the 100s.  Minimal symptoms associate with A-fib other than palpitations.  She continues to take Eliquis for stroke prophylaxis. ? ? ?  ? ?  ? ?Past Surgical History:  ?Procedure Laterality Date  ? 14-day Zio Patch Monitor  05/2021  ? October 2022 100% A. fib rates ranging from 54 to 178 bpm.  Rare PVCs with occasional couplets.  ? cataracts Bilateral   ? COLONOSCOPY WITH PROPOFOL N/A 12/11/2015  ? Procedure: COLONOSCOPY WITH PROPOFOL;  Surgeon: Garlan Fair, MD;  [facility];  Service: Endoscopy;  Laterality: N/A;  ? RIGHT/LEFT HEART CATH AND CORONARY ANGIOGRAPHY Bilateral 06/14/2021  ? Procedure: RIGHT/LEFT HEART CATH AND CORONARY ANGIOGRAPHY;  Surgeon: Leonie Man, MD;  Location: [place] [facility];  Service: Cardiovascular; Angiographic normal coronary arteries.  Cardiac output-index (3.2-2.1). RAP 13/15-12 mmHg, RVP 27/3/10 mmHg; PAP-mean 30/18/23 mmHg, PCWP mean 20 mmHg.   LVEDP-EDP 122/11 mmHg-21 mmHg  ? salzman nodules  12/11/2014  ? Had one eye then the other on 01/2015  ? TONSILLECTOMY    ? TRANSTHORACIC ECHOCARDIOGRAM  05/16/2021  ? Severely reduced LV function-EF of 25 to 30%.  Unable assess diastolic dysfunction due to A. fib.  Mild to moderate LA dilation.  Moderate pericardial effusion.  Moderate MR with holosystolic prolapse of the posterior MV.  Normal RVP and PAP  ? TRANSTHORACIC ECHOCARDIOGRAM  08/30/2021  ? EF 40 to 45%.  No R WMA.  Moderate pericardial effusion. Mild MR and AI.  Moderate-severe TR. => Notable improvement from previous EF of 25 to 30%.  ? VAGINAL HYSTERECTOMY    ? complete  ? ? ?Current Medications: ?Current Meds  ?Medication Sig  ? acetaminophen (TYLENOL) 500 MG tablet Take 500-1,000 mg by mouth every 6 (six) hours as needed for moderate pain or headache.  ? albuterol (VENTOLIN HFA) 108 (90 Base) MCG/ACT inhaler Inhale 2 puffs into the lungs every 6 (six) hours as needed for wheezing or shortness of breath.  ? apixaban (ELIQUIS) 5 MG TABS tablet Take 5 mg by mouth 2 (two) times daily.  ? b complex vitamins capsule Take 1 capsule by mouth daily.  ? Biotin 10000 MCG TABS Take 10,000 mcg by mouth daily.  ? Calcium Carb-Cholecalciferol (CALCIUM 500/VITAMIN D PO) Take 2 each by mouth daily. Chewables  ? Cholecalciferol (VITAMIN D) 50 MCG (2000 UT) tablet Take 2,000 Units by mouth daily.  ? COLLAGEN PO Take 1 Scoop by mouth daily.  ? FLUZONE HIGH-DOSE  QUADRIVALENT 0.7 ML SUSY   ? latanoprost (XALATAN) 0.005 % ophthalmic solution Place 1 drop into both eyes at bedtime.  ? levothyroxine (SYNTHROID) 100 MCG tablet Take 100 mcg by mouth every morning.  ? losartan (COZAAR) 25 MG tablet Take 1 tablet (25 mg total) by mouth daily.  ? magnesium oxide (MAG-OX) 400 MG tablet Take 400 mg by mouth daily.  ? metoprolol succinate (TOPROL XL) 25 MG 24 hr tablet Take 0.5 tablets (12.5 mg total) by mouth daily.  ? Misc Natural Products (GLUCOSAMINE CHOND COMPLEX/MSM PO) Take 1  tablet by mouth daily. Replenex  ? Prenatal Vit-Fe Fumarate-FA (PRENATAL MULTIVITAMIN) TABS tablet Take 1 tablet by mouth daily.  ? SUMAtriptan (IMITREX) 100 MG tablet TAKE 1 TABLET BY MOUTH AS NEEDED FOR HEADACHE MAY REPEAT IN 2 HOURS IF HEADACHE PERSIST  ? Vitamin A 2400 MCG (8000 UT) CAPS Take 8,000 Units by mouth daily.  ? zinc gluconate 50 MG tablet Take 50 mg by mouth 3 (three) times a week.  ?  ? ?Allergies:   Penicillins and Codeine  ? ?Social History  ? ?Socioeconomic History  ? Marital status: Married  ?  Spouse name: [name]  ? Number of children: 1  ? Years of education: 3  ? Highest education level: Not on file  ?Occupational History  ?  Employer: [employer]  ?Tobacco Use  ? Smoking status: Never  ? Smokeless tobacco: Never  ?Vaping Use  ? Vaping Use: Never used  ?Substance and Sexual Activity  ? Alcohol use: Yes  ?  Alcohol/week: 1.0 standard drink  ?  Types: 1 Glasses of wine per week  ?  Comment: wine on occ  ? Drug use: No  ? Sexual activity: Not on file  ?Other Topics Concern  ? Not on file  ?Social History Narrative  ? 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  ? Patient has one child -daughter.>   ? Patient is retired.  ? Patient has a college education.  ? Patient is right-handed.  ? Patient drinks very little caffeine.  ? Exercise at the gym 2 to 3 days a week, walks routinely and does intermittent weights.  ? ?Social Determinants of Health  ? ?Financial Resource Strain: Not on file  ?Food Insecurity: Not on file  ?Transportation Needs: Not on file  ?Physical Activity: Not on file  ?Stress: Not on file  ?Social Connections: Not on file  ?  ? ?Family History: ?The patient's family history includes Congestive Heart Failure (age of onset: 58) in her mother; Fainting in her father; Migraines in her sister; Pneumonia in her father; Stroke (age of onset: 68) in her mother. ? ?ROS:   ?Please see the history of present illness.    ?All other systems reviewed and are  negative. ? ?EKGs/Labs/Other Studies Reviewed:   ? ?The following studies were reviewed today: ? ?October 26, 2021 cardiac MRI ?Normal left ventricular function ?Nonspecific ECV elevation of 31% ?No LGE ? ? ? ?Recent Labs: ?09/07/2021: ALT 15; BUN 16; Creatinine, Ser 1.00; Potassium 4.2; Sodium 140 ?09/19/2021: Hemoglobin 12.2; Platelets 232  ?Recent Lipid Panel ?   ?Component Value Date/Time  ? CHOL 212 (H) 09/07/2021 0914  ? TRIG 54 09/07/2021 0914  ? HDL 93 09/07/2021 0914  ? CHOLHDL 2.3 09/07/2021 0914  ? CHOLHDL 2.5 05/25/2007 0409  ? VLDL 4 05/25/2007 0409  ? Queets 109 (H) 09/07/2021 0914  ? ? ?Physical Exam:   ? ?VS:  BP (!) 96/58   Pulse  65   Ht _0  (1.626 m)   Wt 110 lb 6.4 oz (50.1 kg)   SpO2 93%   BMI 18.95 kg/m?    ? ?Wt Readings from Last 3 Encounters:  ?12/12/21 110 lb 6.4 oz (50.1 kg)  ?11/30/21 110 lb (49.9 kg)  ?10/30/21 110 lb 9.6 oz (50.2 kg)  ?  ? ?GEN:  Well nourished, well developed in no acute distress ?HEENT: Normal ?NECK: No JVD; No carotid bruits ?LYMPHATICS: No lymphadenopathy ?CARDIAC: Irregularly irregular, no murmurs, rubs, gallops ?RESPIRATORY:  Clear to auscultation without rales, wheezing or rhonchi  ?ABDOMEN: Soft, non-tender, non-distended ?MUSCULOSKELETAL:  No edema; No deformity  ?SKIN: Warm and dry ?NEUROLOGIC:  Alert and oriented x 3 ?PSYCHIATRIC:  Normal affect  ? ? ? ?  ? ?ASSESSMENT:   ? ?1. Persistent atrial fibrillation ([place])   ?2. Chronic systolic heart failure ([place])   ?3. Pericardial effusion   ? ?PLAN:   ? ?In order of problems listed above: ? ? ?#Persistent atrial fibrillation ?Previously on amiodarone.  Now on metoprolol only but having more frequent episodes of atrial fibrillation.  Her last dose of amiodarone was in the very beginning of 2023.  This had to be stopped because of thyroid dysfunction.  Discussed the options again during today's clinic appointment including flecainide, amiodarone and dofetilide.  I would favor proceeding with dofetilide initiation.   I discussed the process in detail with the patient and her husband during today's clinic appointment and they wish to proceed.  We will target June which will be at least 4 months after she last took amiodarone. ? ?#Pericardial e

## 2021-12-12 ENCOUNTER — Encounter: Payer: Self-pay | Admitting: Cardiology

## 2021-12-12 ENCOUNTER — Ambulatory Visit: Payer: Medicare Other | Admitting: Cardiology

## 2021-12-12 ENCOUNTER — Telehealth: Payer: Self-pay | Admitting: Pharmacist

## 2021-12-12 VITALS — BP 96/58 | HR 65 | Ht 64.0 in | Wt 110.4 lb

## 2021-12-12 DIAGNOSIS — I4819 Other persistent atrial fibrillation: Secondary | ICD-10-CM | POA: Diagnosis not present

## 2021-12-12 DIAGNOSIS — I5022 Chronic systolic (congestive) heart failure: Secondary | ICD-10-CM

## 2021-12-12 DIAGNOSIS — I3139 Other pericardial effusion (noninflammatory): Secondary | ICD-10-CM

## 2021-12-12 NOTE — Telephone Encounter (Signed)
Medication list reviewed in anticipation of upcoming Tikosyn initiation. Patient is taking albuterol which is QTc prolonging but ok to continue. She does not take any contraindicated prolonging medications. Planning for June Tikosyn start which would be at least 4 months after pt last took amiodarone. ? ?Patient is anticoagulated on Eliquis '5mg'$  BID on the appropriate dose. Please ensure that patient has not missed any anticoagulation doses in the 3 weeks prior to Tikosyn initiation.  ? ?Patient will need to be counseled to avoid use of Benadryl while on Tikosyn and in the 2-3 days prior to Tikosyn initiation. ? ?

## 2021-12-12 NOTE — Patient Instructions (Addendum)
Medications: ?Your physician recommends that you continue on your current medications as directed. Please refer to the Current Medication list given to you today. ?*If you need a refill on your cardiac medications before your next appointment, please call your pharmacy* ? ?Lab Work: ?None. ?If you have labs (blood work) drawn today and your tests are completely normal, you will receive your results only by: ?MyChart Message (if you have MyChart) OR ?A paper copy in the mail ?If you have any lab test that is abnormal or we need to change your treatment, we will call you to review the results. ? ?Testing/Procedures: ?None. ? ?Follow-Up: ?At [facility], you and your health needs are our priority.  As part of our continuing mission to provide you with exceptional heart care, we have created designated Provider Care Teams.  These Care Teams include your primary Cardiologist (physician) and Advanced Practice Providers (APPs -  Physician Assistants and Nurse Practitioners) who all work together to provide you with the care you need, when you need it. ? ?Your physician wants you to follow-up in: Stacy from the [facility] will call you to get your Tikosyn load set up.  ? ?We recommend signing up for the patient portal called "MyChart".  Sign up information is provided on this After Visit Summary.  MyChart is used to connect with patients for Virtual Visits (Telemedicine).  Patients are able to view lab/test results, encounter notes, upcoming appointments, etc.  Non-urgent messages can be sent to your provider as well.   ?To learn more about what you can do with MyChart, go to [email or web address].   ? ?Any Other Special Instructions Will Be Listed Below (If Applicable).  ? ?AFIB CLINIC INFORMATION: ?Your appointment is scheduled on: _______ at ______. Please arrive 15 minutes early for check-in. ?The AFib Clinic is located in the Heart and Vascular Specialty Clinics at [facility]. ?Parking instructions/directions:  [address] (off [address]). When you pull in to Entrance C, there is an underground parking garage to your right. The code to enter the garage is _______. Take the elevators to the first floor. Follow the signs to the Heart and Vascular Specialty Clinics. You will see registration at the end of the hallway.  ?Phone number: [phone] ? ?Tikosyn (Dofetilide) Hospital Admission  ? ?Prior to day of admission:  ?Check with drug insurance company for cost of drug to ensure affordability --- Dofetilide 500 mcg twice a day.  ?GoodRx is an option if insurance copay is unaffordable.  ? ?All patients are tested for COVID-19 prior to admission.  ? ?No Benadryl is allowed 3 days prior to admission.  ? ?Please ensure no missed doses of your anticoagulation (blood thinner) for 3 weeks prior to admission. If a dose is missed please notify our office immediately.  ? ?A pharmacist will review all your medications for potential interactions with Tikosyn. If any medication changes are needed prior to admission we will be in touch with you.  ? ?If any new medications are started AFTER your admission date is set with Marzetta Board RN. Please notify our office immediately so your medication list can be updated and reviewed by our pharmacist again.  ?On day of admission:  ?Tikosyn initiation requires a 3 night/4 day hospital stay with constant telemetry monitoring. You will have an EKG after each dose of Tikosyn as well as daily lab draws.  ? ?If the drug does not convert you to normal rhythm a cardioversion after the 4th dose of Tikosyn.  ? ?  Afib Clinic office visit on the morning of admission is needed for preliminary labs/ekg.  ? ?Time of admission is dependent on bed availability in the hospital. In some instances, you will be sent home until bed is available. Rarely admission can be delayed to the following day if hospital census prevents available beds.  ? ?You may bring personal belongings/clothing with you to the hospital.  Please leave your suitcase in the car until you arrive in admissions.  ? ?Questions please call our office at [phone]   ?

## 2021-12-14 DIAGNOSIS — H1032 Unspecified acute conjunctivitis, left eye: Secondary | ICD-10-CM | POA: Diagnosis not present

## 2021-12-14 DIAGNOSIS — R051 Acute cough: Secondary | ICD-10-CM | POA: Diagnosis not present

## 2021-12-14 DIAGNOSIS — J069 Acute upper respiratory infection, unspecified: Secondary | ICD-10-CM | POA: Diagnosis not present

## 2021-12-18 ENCOUNTER — Telehealth: Payer: Self-pay | Admitting: Adult Health

## 2021-12-18 NOTE — Telephone Encounter (Signed)
Rescheduled 6/7 appt with pt over the phone- NP out. ?

## 2021-12-19 ENCOUNTER — Encounter: Payer: Self-pay | Admitting: Cardiology

## 2022-01-15 ENCOUNTER — Ambulatory Visit (INDEPENDENT_AMBULATORY_CARE_PROVIDER_SITE_OTHER): Payer: Medicare Other | Admitting: Adult Health

## 2022-01-15 ENCOUNTER — Encounter: Payer: Self-pay | Admitting: Adult Health

## 2022-01-15 VITALS — BP 86/56 | HR 66 | Ht 65.0 in | Wt 111.5 lb

## 2022-01-15 DIAGNOSIS — G35 Multiple sclerosis: Secondary | ICD-10-CM

## 2022-01-15 DIAGNOSIS — G43909 Migraine, unspecified, not intractable, without status migrainosus: Secondary | ICD-10-CM | POA: Diagnosis not present

## 2022-01-15 MED ORDER — SUMATRIPTAN SUCCINATE 100 MG PO TABS: ORAL_TABLET | ORAL | 3 refills | Status: DC

## 2022-01-15 NOTE — Progress Notes (Addendum)
PATIENT: [name] DOB: Jun 29, 1946  REASON FOR VISIT: follow up HISTORY FROM: patient Chief Complaint  Patient presents with   Follow-up    Pt in 5 pt is here for MS  and Migraine follow up  Pt states she has no questions or concerns for todays visit . Pt states no changes since last visit     HISTORY OF PRESENT ILLNESS: Today 01/15/22:  Ms. [name] is a 76 year old female with a history of migraine headaches and mulitple sclerosis.  She returns today for follow-up.  MS: No DMT, reports symptoms are stable.  Denies any new numbness or weakness.  Denies any changes with her gait or balance.  No changes with her vision.  No changes with the bowels or bladder.  Migraine headaches: last migraine was in [place]. Uses imitrex- it resolves the headache typically.   6/2/22Ms. [name] is a 76 year old female with a history of migraine headaches and multiple sclerosis.  She returns today for follow-up.  The patient is no longer on any disease modifying therapy.  She states overall she has been stable.  Had repeat imaging of the brain in April that was stable.  She states that she has noticed some tingling in the left hand and on the right side of the scalp in the temporal region.  She states that this occurs on occasion but is not consistent.  She denies any changes with her gait or balance.  No changes with the bowels or bladder.  No changes with her vision.  She states that her migraines have been stable.  She states that they typically wake her up out of sleep and this is new for her.  She states that she has not had a migraine in 2 months.  She continues to use Imitrex if needed.  HISTORY (Copied from Dr.Dohmeier's note) Ms. [name], a 76 year old retired , married female returns for followup. She was last seen 07/03/15 and has been followed for multiple sclerosis since October 2008.  She has had 2 migraines in the past month, worse during allergy season  She is  on  Imitrex with good results. She does not want to be on a preventative medication . She has associated photophobia, she used to have a visual aura of  Zig zag lines and lights , now it is more of a tunnel vision arising from the periphery centrally with tiny little light dots.The headaches last with a pounding throbbing character about 3 hours up to a whole day . She is fatigued afterwards. She is highly sensitive to scents and smells.   She is currently on Rebif tolerating the medication without significant injection reactions. She does not pretreat.  She denies spasms, focal weakness, sensory changes, visual changes, speech or swallowing problems, no problems with bowel or bladder function. She has not fallen, she does not use an assistive device.    Last MRI of the brain 07/02/13 read as abnormal - MRI scan of brain showing multilple periventricular and supratentorial white matter lesions consistent with multiple sclerosis. No enhancing lesions are noted. The presence of T 1 black holes indicates chronic disease. Overall mild increase in white matter lesion load compared to MRI 05/24/2007. She returns for reevaluation  REVIEW OF SYSTEMS: Out of a complete 14 system review of symptoms, the patient complains only of the following symptoms, and all other reviewed systems are negative.  See HPI  ALLERGIES: Allergies  Allergen Reactions   Penicillins Rash    As a child. Has  patient had a PCN reaction causing immediate rash, facial/tongue/throat swelling, SOB or lightheadedness with hypotension: No Has patient had a PCN reaction causing severe rash involving mucus membranes or skin necrosis: No Has patient had a PCN reaction that required hospitalization: No Has patient had a PCN reaction occurring within the last 10 years: No If all of the above answers are "NO", then may proceed with Cephalosporin use.    Codeine Nausea And Vomiting    HOME MEDICATIONS: Outpatient Medications Prior to Visit   Medication Sig Dispense Refill   acetaminophen (TYLENOL) 500 MG tablet Take 500-1,000 mg by mouth every 6 (six) hours as needed for moderate pain or headache.     albuterol (VENTOLIN HFA) 108 (90 Base) MCG/ACT inhaler Inhale 2 puffs into the lungs every 6 (six) hours as needed for wheezing or shortness of breath. 8 g 6   apixaban (ELIQUIS) 5 MG TABS tablet Take 5 mg by mouth 2 (two) times daily.     b complex vitamins capsule Take 1 capsule by mouth daily.     Biotin 10000 MCG TABS Take 10,000 mcg by mouth daily.     Calcium Carb-Cholecalciferol (CALCIUM 500/VITAMIN D PO) Take 2 each by mouth daily. Chewables     Cholecalciferol (VITAMIN D) 50 MCG (2000 UT) tablet Take 2,000 Units by mouth daily.     COLLAGEN PO Take 1 Scoop by mouth daily.     FLUZONE HIGH-DOSE QUADRIVALENT 0.7 ML SUSY      latanoprost (XALATAN) 0.005 % ophthalmic solution Place 1 drop into both eyes at bedtime.  6   levothyroxine (SYNTHROID) 100 MCG tablet Take 100 mcg by mouth every morning.     losartan (COZAAR) 25 MG tablet Take 1 tablet (25 mg total) by mouth daily. 30 tablet 11   magnesium oxide (MAG-OX) 400 MG tablet Take 400 mg by mouth daily.     metoprolol succinate (TOPROL XL) 25 MG 24 hr tablet Take 0.5 tablets (12.5 mg total) by mouth daily. 45 tablet 2   Misc Natural Products (GLUCOSAMINE CHOND COMPLEX/MSM PO) Take 1 tablet by mouth daily. Replenex     Prenatal Vit-Fe Fumarate-FA (PRENATAL MULTIVITAMIN) TABS tablet Take 1 tablet by mouth daily.     SUMAtriptan (IMITREX) 100 MG tablet TAKE 1 TABLET BY MOUTH AS NEEDED FOR HEADACHE MAY REPEAT IN 2 HOURS IF HEADACHE PERSIST 10 tablet 3   Vitamin A 2400 MCG (8000 UT) CAPS Take 8,000 Units by mouth daily.     zinc gluconate 50 MG tablet Take 50 mg by mouth 3 (three) times a week.     No facility-administered medications prior to visit.    PAST MEDICAL HISTORY: Past Medical History:  Diagnosis Date   A-fib ([place])    Dilated cardiomyopathy ([place]) 05/28/2021    NON-ISCHEMIC -> with restoration of sinus rhythm, EF improved from 25-30% up to 40 and 45%.  Moderate-severe TR noted   Diverticulosis 2012   With intermittent diverticulitis   Endometrial cancer ([place]) 2009   S/p vaginal hysterectomy   Family history of adverse reaction to anesthesia    mother ponv   Glaucoma    Hypothyroidism due to thyroiditis treated with radioactive iodine    Idiopathic pericardial effusion 05/16/2021   Migraine    Mitral valve regurgitation due to prolapse of cusp 05/16/2021   Echo: Moderate MR with holosystolic prolapse of the posterior MV   Multiple sclerosis ([place]) 2008   Well-controlled, currently not on medications.  Repatha subcu 3 times weekly from 2008-2021.  No further relapse.   Persistent atrial fibrillation ([place]) 05/02/2021    PAST SURGICAL HISTORY: Past Surgical History:  Procedure Laterality Date   14-day Zio Patch Monitor  05/2021   October 2022 100% A. fib rates ranging from 54 to 178 bpm.  Rare PVCs with occasional couplets.   cataracts Bilateral    COLONOSCOPY WITH PROPOFOL N/A 12/11/2015   Procedure: COLONOSCOPY WITH PROPOFOL;  Surgeon: Garlan Fair, MD;  Location: [facility];  Service: Endoscopy;  Laterality: N/A;   RIGHT/LEFT HEART CATH AND CORONARY ANGIOGRAPHY Bilateral 06/14/2021   Procedure: RIGHT/LEFT HEART CATH AND CORONARY ANGIOGRAPHY;  Surgeon: Leonie Man, MD;  Location: [facility];  Service: Cardiovascular; Angiographic normal coronary arteries.  Cardiac output-index (3.2-2.1). RAP 13/15-12 mmHg, RVP 27/3/10 mmHg; PAP-mean 30/18/23 mmHg, PCWP mean 20 mmHg.  LVEDP-EDP 122/11 mmHg-21 mmHg   salzman nodules  12/11/2014   Had one eye then the other on 01/2015   TONSILLECTOMY     TRANSTHORACIC ECHOCARDIOGRAM  05/16/2021   Severely reduced LV function-EF of 25 to 30%.  Unable assess diastolic dysfunction due to A. fib.  Mild to moderate LA dilation.  Moderate pericardial effusion.  Moderate MR with holosystolic prolapse  of the posterior MV.  Normal RVP and PAP   TRANSTHORACIC ECHOCARDIOGRAM  08/30/2021   EF 40 to 45%.  No R WMA.  Moderate pericardial effusion. Mild MR and AI.  Moderate-severe TR. => Notable improvement from previous EF of 25 to 30%.   VAGINAL HYSTERECTOMY     complete    FAMILY HISTORY: Family History  Problem Relation Age of Onset   Stroke Mother 63       lived 51 yrs   Congestive Heart Failure Mother 75   Fainting Father    Pneumonia Father        Thought to be aspiration pneumonia following an episode of syncope.   Migraines Father    Migraines Sister    Migraines Daughter    Migraines Granddaughter    Migraines Grandson     SOCIAL HISTORY: Social History   Socioeconomic History   Marital status: Married    Spouse name: Scientist, physiological   Number of children: 1   Years of education: 14   Highest education level: Not on file  Occupational History    Employer: [employer]  Tobacco Use   Smoking status: Never   Smokeless tobacco: Never  Vaping Use   Vaping Use: Never used  Substance and Sexual Activity   Alcohol use: Yes    Alcohol/week: 1.0 standard drink    Types: 1 Glasses of wine per week    Comment: wine on occ   Drug use: No   Sexual activity: Not on file  Other Topics Concern   Not on file  Social History Narrative   [name], 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   Patient has one child -daughter.>    Patient is retired.   Patient has a college education.   Patient is right-handed.   Patient drinks very little caffeine.   Exercise at the gym 2 to 3 days a week, walks routinely and does intermittent weights.   Social Determinants of Health   Financial Resource Strain: Not on file  Food Insecurity: Not on file  Transportation Needs: Not on file  Physical Activity: Not on file  Stress: Not on file  Social Connections: Not on file  Intimate Partner Violence: Not on file      PHYSICAL EXAM  Vitals:  01/15/22 0949  BP: (!) 86/56  Pulse: 66   Weight: 111 lb 8 oz (50.6 kg)  Height: '5\' 5"'$  (1.651 m)   Body mass index is 18.55 kg/m.  Generalized: Well developed, in no acute distress   Neurological examination  Mentation: Alert oriented to time, place, history taking. Follows all commands speech and language fluent Cranial nerve II-XII: Pupils were equal round reactive to light. Extraocular movements were full, visual field were full on confrontational test. Facial sensation and strength were normal. Uvula tongue midline. Head turning and shoulder shrug  were normal and symmetric. Motor: The motor testing reveals 5 over 5 strength of all 4 extremities. Good symmetric motor tone is noted throughout.  Sensory: Sensory testing is intact to soft touch on all 4 extremities. No evidence of extinction is noted.  Coordination: Cerebellar testing reveals good finger-nose-finger and heel-to-shin bilaterally.  Gait and station: Gait is normal.  Reflexes: Deep tendon reflexes are symmetric and normal bilaterally.   DIAGNOSTIC DATA (LABS, IMAGING, TESTING) - I reviewed patient records, labs, notes, testing and imaging myself where available.  Lab Results  Component Value Date   WBC 5.6 09/19/2021   HGB 12.2 09/19/2021   HCT 35.7 09/19/2021   MCV 100 (H) 09/19/2021   PLT 232 09/19/2021      Component Value Date/Time   NA 140 09/07/2021 0914   K 4.2 09/07/2021 0914   CL 100 09/07/2021 0914   CO2 27 09/07/2021 0914   GLUCOSE 89 09/07/2021 0914   GLUCOSE 91 05/28/2007 0410   BUN 16 09/07/2021 0914   CREATININE 1.00 09/07/2021 0914   CALCIUM 9.7 09/07/2021 0914   PROT 6.8 09/07/2021 0914   ALBUMIN 4.4 09/07/2021 0914   AST 25 09/07/2021 0914   ALT 15 09/07/2021 0914   ALKPHOS 116 09/07/2021 0914   BILITOT 0.4 09/07/2021 0914   GFRNONAA 80 06/08/2020 1404   GFRAA 93 06/08/2020 1404   Lab Results  Component Value Date   CHOL 212 (H) 09/07/2021   HDL 93 09/07/2021   LDLCALC 109 (H) 09/07/2021   TRIG 54 09/07/2021   CHOLHDL  2.3 09/07/2021      ASSESSMENT AND PLAN 76 y.o. year old female  has a past medical history of A-fib ([place]), Dilated cardiomyopathy ([place]) (05/28/2021), Diverticulosis (2012), Endometrial cancer ([place]) (2009), Family history of adverse reaction to anesthesia, Glaucoma, Hypothyroidism due to thyroiditis treated with radioactive iodine, Idiopathic pericardial effusion (05/16/2021), Migraine, Mitral valve regurgitation due to prolapse of cusp (05/16/2021), Multiple sclerosis ([place]) (2008), and Persistent atrial fibrillation ([place]) (05/02/2021). here with:  1.  Multiple sclerosis  --Continue to monitor symptoms  2.  Migraine headaches  --Continue Imitrex as abortive therapy -- Advised if headache frequency or severity increases she should let us know  --Follow-up in 1 year or sooner if needed  Ward Givens, MSN, NP-C 01/15/2022, 10:35 AM [facility] [address] [place], [place] [address] [phone]

## 2022-01-15 NOTE — Patient Instructions (Signed)
Your Plan: ? ?Continue ? ? ? ? ?Thank you for coming to see us at [facility]. I hope we have been able to provide you high quality care today. ? ?You may receive a patient satisfaction survey over the next few weeks. We would appreciate your feedback and comments so that we may continue to improve ourselves and the health of our patients. ? ?

## 2022-01-16 ENCOUNTER — Ambulatory Visit: Payer: Medicare Other | Admitting: Adult Health

## 2022-01-22 ENCOUNTER — Inpatient Hospital Stay (HOSPITAL_COMMUNITY)
Admission: AD | Admit: 2022-01-22 | Discharge: 2022-01-25 | DRG: 309 | Disposition: A | Discharge status: Home / Self Care | Payer: Medicare Other | Source: Ambulatory Visit | Attending: Cardiology | Admitting: Cardiology

## 2022-01-22 ENCOUNTER — Other Ambulatory Visit (HOSPITAL_COMMUNITY): Payer: Self-pay

## 2022-01-22 ENCOUNTER — Ambulatory Visit (HOSPITAL_COMMUNITY)
Admission: RE | Admit: 2022-01-22 | Discharge: 2022-01-22 | Disposition: A | Discharge status: Home / Self Care | Payer: Medicare Other | Source: Ambulatory Visit | Attending: Physician Assistant | Admitting: Physician Assistant

## 2022-01-22 ENCOUNTER — Encounter (HOSPITAL_COMMUNITY): Payer: Self-pay | Admitting: Physician Assistant

## 2022-01-22 VITALS — BP 102/66 | HR 74 | Ht 65.0 in | Wt 111.0 lb

## 2022-01-22 DIAGNOSIS — Z8542 Personal history of malignant neoplasm of other parts of uterus: Secondary | ICD-10-CM | POA: Diagnosis not present

## 2022-01-22 DIAGNOSIS — Z885 Allergy status to narcotic agent status: Secondary | ICD-10-CM | POA: Diagnosis not present

## 2022-01-22 DIAGNOSIS — H409 Unspecified glaucoma: Secondary | ICD-10-CM | POA: Diagnosis present

## 2022-01-22 DIAGNOSIS — R001 Bradycardia, unspecified: Secondary | ICD-10-CM | POA: Diagnosis not present

## 2022-01-22 DIAGNOSIS — Z79890 Hormone replacement therapy (postmenopausal): Secondary | ICD-10-CM

## 2022-01-22 DIAGNOSIS — I420 Dilated cardiomyopathy: Secondary | ICD-10-CM | POA: Diagnosis not present

## 2022-01-22 DIAGNOSIS — Z7901 Long term (current) use of anticoagulants: Secondary | ICD-10-CM

## 2022-01-22 DIAGNOSIS — D6869 Other thrombophilia: Secondary | ICD-10-CM | POA: Diagnosis present

## 2022-01-22 DIAGNOSIS — N289 Disorder of kidney and ureter, unspecified: Secondary | ICD-10-CM | POA: Diagnosis present

## 2022-01-22 DIAGNOSIS — Z880 Allergy status to penicillin: Secondary | ICD-10-CM

## 2022-01-22 DIAGNOSIS — Z79899 Other long term (current) drug therapy: Secondary | ICD-10-CM

## 2022-01-22 DIAGNOSIS — G43909 Migraine, unspecified, not intractable, without status migrainosus: Secondary | ICD-10-CM | POA: Diagnosis not present

## 2022-01-22 DIAGNOSIS — I4819 Other persistent atrial fibrillation: Secondary | ICD-10-CM | POA: Diagnosis not present

## 2022-01-22 DIAGNOSIS — G35 Multiple sclerosis: Secondary | ICD-10-CM | POA: Diagnosis not present

## 2022-01-22 DIAGNOSIS — I081 Rheumatic disorders of both mitral and tricuspid valves: Secondary | ICD-10-CM | POA: Diagnosis not present

## 2022-01-22 DIAGNOSIS — Z8249 Family history of ischemic heart disease and other diseases of the circulatory system: Secondary | ICD-10-CM | POA: Diagnosis not present

## 2022-01-22 DIAGNOSIS — E890 Postprocedural hypothyroidism: Secondary | ICD-10-CM | POA: Diagnosis present

## 2022-01-22 DIAGNOSIS — I5022 Chronic systolic (congestive) heart failure: Secondary | ICD-10-CM | POA: Diagnosis not present

## 2022-01-22 LAB — BASIC METABOLIC PANEL
Anion gap: 8 (ref 5–15)
BUN: 25 mg/dL — ABNORMAL HIGH (ref 8–23)
CO2: 26 mmol/L (ref 22–32)
Calcium: 9.3 mg/dL (ref 8.9–10.3)
Chloride: 106 mmol/L (ref 98–111)
Creatinine, Ser: 0.81 mg/dL (ref 0.44–1.00)
GFR, Estimated: >60 mL/min (ref >60)
Glucose, Bld: 91 mg/dL (ref 70–99)
Potassium: 4 mmol/L (ref 3.5–5.1)
Sodium: 140 mmol/L (ref 135–145)

## 2022-01-22 LAB — MAGNESIUM: Magnesium: 2.4 mg/dL (ref 1.7–2.4)

## 2022-01-22 MED ORDER — VITAMIN D3 25 MCG (1000 UNIT) PO TABS
2000.0000 [IU] | ORAL_TABLET | Freq: Every day | ORAL | Status: DC
  Administered 2022-01-23 – 2022-01-25 (×3): 2000 [IU] via ORAL
  Filled 2022-01-22 (×6): qty 2

## 2022-01-22 MED ORDER — LEVOTHYROXINE SODIUM 100 MCG PO TABS
100.0000 ug | ORAL_TABLET | Freq: Every day | ORAL | Status: DC
  Administered 2022-01-23 – 2022-01-25 (×3): 100 ug via ORAL
  Filled 2022-01-22 (×3): qty 1

## 2022-01-22 MED ORDER — METOPROLOL SUCCINATE ER 25 MG PO TB24
12.5000 mg | ORAL_TABLET | Freq: Every day | ORAL | Status: DC
  Administered 2022-01-23 – 2022-01-25 (×3): 12.5 mg via ORAL
  Filled 2022-01-22 (×3): qty 1

## 2022-01-22 MED ORDER — SUMATRIPTAN SUCCINATE 100 MG PO TABS
100.0000 mg | ORAL_TABLET | ORAL | Status: DC | PRN
  Filled 2022-01-22: qty 1

## 2022-01-22 MED ORDER — SODIUM CHLORIDE 0.9% FLUSH: 3.0000 mL | INTRAVENOUS | Status: DC | PRN

## 2022-01-22 MED ORDER — DOFETILIDE 250 MCG PO CAPS
250.0000 ug | ORAL_CAPSULE | Freq: Two times a day (BID) | ORAL | Status: DC
  Administered 2022-01-22 – 2022-01-25 (×6): 250 ug via ORAL
  Filled 2022-01-22 (×6): qty 1

## 2022-01-22 MED ORDER — LATANOPROST 0.005 % OP SOLN
1.0000 [drp] | Freq: Every day | OPHTHALMIC | Status: DC
  Filled 2022-01-22: qty 2.5

## 2022-01-22 MED ORDER — ALBUTEROL SULFATE (2.5 MG/3ML) 0.083% IN NEBU: 3.0000 mL | INHALATION_SOLUTION | Freq: Four times a day (QID) | RESPIRATORY_TRACT | Status: DC | PRN

## 2022-01-22 MED ORDER — SODIUM CHLORIDE 0.9% FLUSH
3.0000 mL | Freq: Two times a day (BID) | INTRAVENOUS | Status: DC
  Administered 2022-01-22 – 2022-01-25 (×7): 3 mL via INTRAVENOUS

## 2022-01-22 MED ORDER — ACETAMINOPHEN 500 MG PO TABS: 500.0000 mg | ORAL_TABLET | Freq: Four times a day (QID) | ORAL | Status: DC | PRN

## 2022-01-22 MED ORDER — SODIUM CHLORIDE 0.9 % IV SOLN: 250.0000 mL | INTRAVENOUS | Status: DC | PRN

## 2022-01-22 MED ORDER — APIXABAN 5 MG PO TABS
5.0000 mg | ORAL_TABLET | Freq: Two times a day (BID) | ORAL | Status: DC
  Administered 2022-01-22 – 2022-01-25 (×6): 5 mg via ORAL
  Filled 2022-01-22 (×6): qty 1

## 2022-01-22 MED ORDER — LOSARTAN POTASSIUM 25 MG PO TABS
25.0000 mg | ORAL_TABLET | Freq: Every day | ORAL | Status: DC
  Administered 2022-01-23: 25 mg via ORAL
  Filled 2022-01-22: qty 1

## 2022-01-22 MED ORDER — MAGNESIUM OXIDE -MG SUPPLEMENT 400 (240 MG) MG PO TABS
400.0000 mg | ORAL_TABLET | Freq: Every day | ORAL | Status: DC
  Administered 2022-01-23 – 2022-01-25 (×3): 400 mg via ORAL
  Filled 2022-01-22 (×5): qty 1

## 2022-01-22 NOTE — Progress Notes (Signed)
Primary Care Physician: Leeroy Cha, MD Primary Cardiologist: Dr Ellyn Hack Primary Electrophysiologist: Dr Quentin Ore Referring Physician: Dr Sharen Hint [name] is a 76 y.o. female with a history of chronic systolic CHF, moderate to severe TR, endometrial cancer, multiple sclerosis, atrial fibrillation who presents for follow up in the [facility]. She had been maintained on amiodarone but had dysregulation of her thyroid as well as abnormal PFT and amiodarone was discontinued. Patient is on Eliquis for a CHADS2VASC score of 3. She was seen by Dr Quentin Ore 09/19/21, not felt to be a good ablation candidate given chronic pericardial effusion. Dofetilide was recommended.   On follow up today, patient presents for dofetilide admission. She denies any missed doses of anticoagulation in the past 3 weeks. She is in SR today but still has frequent paroxysms of afib at home.   Today, she denies symptoms of chest pain, shortness of breath, orthopnea, PND, lower extremity edema, dizziness, presyncope, syncope, snoring, daytime somnolence, bleeding, or neurologic sequela. The patient is tolerating medications without difficulties and is otherwise without complaint today.    Atrial Fibrillation Risk Factors:  she does not have symptoms or diagnosis of sleep apnea. she does not have a history of rheumatic fever.   she has a BMI of Body mass index is 18.47 kg/m.[name] Filed Weights   01/22/22 1117  Weight: 50.3 kg    Family History  Problem Relation Age of Onset   Stroke Mother 21       lived 20 yrs   Congestive Heart Failure Mother 90   Fainting Father    Pneumonia Father        Thought to be aspiration pneumonia following an episode of syncope.   Migraines Father    Migraines Sister    Migraines Daughter    Migraines Granddaughter    Migraines Grandson      Atrial Fibrillation Management history:  Previous antiarrhythmic drugs: amiodarone   Previous cardioversions: none Previous ablations: none CHADS2VASC score: 3 Anticoagulation history: Eliquis   Past Medical History:  Diagnosis Date   A-fib ([place])    Dilated cardiomyopathy ([place]) 05/28/2021   NON-ISCHEMIC -> with restoration of sinus rhythm, EF improved from 25-30% up to 40 and 45%.  Moderate-severe TR noted   Diverticulosis 2012   With intermittent diverticulitis   Endometrial cancer ([place]) 2009   S/p vaginal hysterectomy   Family history of adverse reaction to anesthesia    mother ponv   Glaucoma    Hypothyroidism due to thyroiditis treated with radioactive iodine    Idiopathic pericardial effusion 05/16/2021   Migraine    Mitral valve regurgitation due to prolapse of cusp 05/16/2021   Echo: Moderate MR with holosystolic prolapse of the posterior MV   Multiple sclerosis ([place]) 2008   Well-controlled, currently not on medications.  Repatha subcu 3 times weekly from 2008-2021.  No further relapse.   Persistent atrial fibrillation ([place]) 05/02/2021   Past Surgical History:  Procedure Laterality Date   14-day Zio Patch Monitor  05/2021   October 2022 100% A. fib rates ranging from 54 to 178 bpm.  Rare PVCs with occasional couplets.   cataracts Bilateral    COLONOSCOPY WITH PROPOFOL N/A 12/11/2015   Procedure: COLONOSCOPY WITH PROPOFOL;  Surgeon: Garlan Fair, MD;  Location: [facility];  Service: Endoscopy;  Laterality: N/A;   RIGHT/LEFT HEART CATH AND CORONARY ANGIOGRAPHY Bilateral 06/14/2021   Procedure: RIGHT/LEFT HEART CATH AND CORONARY ANGIOGRAPHY;  Surgeon: Leonie Man, MD;  Location: [facility];  Service: Cardiovascular; Angiographic normal coronary arteries.  Cardiac output-index (3.2-2.1). RAP 13/15-12 mmHg, RVP 27/3/10 mmHg; PAP-mean 30/18/23 mmHg, PCWP mean 20 mmHg.  LVEDP-EDP 122/11 mmHg-21 mmHg   salzman nodules  12/11/2014   Had one eye then the other on 01/2015   TONSILLECTOMY     TRANSTHORACIC ECHOCARDIOGRAM  05/16/2021   Severely  reduced LV function-EF of 25 to 30%.  Unable assess diastolic dysfunction due to A. fib.  Mild to moderate LA dilation.  Moderate pericardial effusion.  Moderate MR with holosystolic prolapse of the posterior MV.  Normal RVP and PAP   TRANSTHORACIC ECHOCARDIOGRAM  08/30/2021   EF 40 to 45%.  No R WMA.  Moderate pericardial effusion. Mild MR and AI.  Moderate-severe TR. => Notable improvement from previous EF of 25 to 30%.   VAGINAL HYSTERECTOMY     complete    Current Outpatient Medications  Medication Sig Dispense Refill   acetaminophen (TYLENOL) 500 MG tablet Take 500-1,000 mg by mouth every 6 (six) hours as needed for moderate pain or headache.     albuterol (VENTOLIN HFA) 108 (90 Base) MCG/ACT inhaler Inhale 2 puffs into the lungs every 6 (six) hours as needed for wheezing or shortness of breath. 8 g 6   apixaban (ELIQUIS) 5 MG TABS tablet Take 5 mg by mouth 2 (two) times daily.     b complex vitamins capsule Take 1 capsule by mouth daily.     Biotin 10000 MCG TABS Take 10,000 mcg by mouth daily.     Calcium Carb-Cholecalciferol (CALCIUM 500/VITAMIN D PO) Take 2 each by mouth daily. Chewables     Cholecalciferol (VITAMIN D) 50 MCG (2000 UT) tablet Take 2,000 Units by mouth daily.     COLLAGEN PO Take 1 Scoop by mouth daily.     FLUZONE HIGH-DOSE QUADRIVALENT 0.7 ML SUSY      latanoprost (XALATAN) 0.005 % ophthalmic solution Place 1 drop into both eyes at bedtime.  6   levothyroxine (SYNTHROID) 100 MCG tablet Take 100 mcg by mouth every morning.     losartan (COZAAR) 25 MG tablet Take 1 tablet (25 mg total) by mouth daily. 30 tablet 11   magnesium oxide (MAG-OX) 400 MG tablet Take 400 mg by mouth daily.     metoprolol succinate (TOPROL XL) 25 MG 24 hr tablet Take 0.5 tablets (12.5 mg total) by mouth daily. 45 tablet 2   Misc Natural Products (GLUCOSAMINE CHOND COMPLEX/MSM PO) Take 1 tablet by mouth daily. Replenex     Prenatal Vit-Fe Fumarate-FA (PRENATAL MULTIVITAMIN) TABS tablet Take 1  tablet by mouth daily.     SUMAtriptan (IMITREX) 100 MG tablet TAKE 1 TABLET BY MOUTH AS NEEDED FOR HEADACHE MAY REPEAT IN 2 HOURS IF HEADACHE PERSIST 10 tablet 3   Vitamin A 2400 MCG (8000 UT) CAPS Take 8,000 Units by mouth daily.     zinc gluconate 50 MG tablet Take 50 mg by mouth 3 (three) times a week.     No current facility-administered medications for this encounter.    Allergies  Allergen Reactions   Penicillins Rash    As a child. Has patient had a PCN reaction causing immediate rash, facial/tongue/throat swelling, SOB or lightheadedness with hypotension: No Has patient had a PCN reaction causing severe rash involving mucus membranes or skin necrosis: No Has patient had a PCN reaction that required hospitalization: No Has patient had a PCN reaction occurring within the last 10 years: No If all of  the above answers are "NO", then may proceed with Cephalosporin use.    Codeine Nausea And Vomiting    Social History   Socioeconomic History   Marital status: Married    Spouse name: [name]   Number of children: 1   Years of education: 14   Highest education level: Not on file  Occupational History    Employer: [employer]  Tobacco Use   Smoking status: Never   Smokeless tobacco: Never   Tobacco comments:    Never smoke 01/22/22  Vaping Use   Vaping Use: Never used  Substance and Sexual Activity   Alcohol use: Yes    Alcohol/week: 1.0 standard drink of alcohol    Types: 1 Glasses of wine per week    Comment: wine on occ   Drug use: No   Sexual activity: Not on file  Other Topics Concern   Not on file  Social History Narrative   [name], 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   Patient has one child -daughter.>    Patient is retired.   Patient has a college education.   Patient is right-handed.   Patient drinks very little caffeine.   Exercise at the gym 2 to 3 days a week, walks routinely and does intermittent weights.   Social Determinants of Health    Financial Resource Strain: Not on file  Food Insecurity: Not on file  Transportation Needs: Not on file  Physical Activity: Not on file  Stress: Not on file  Social Connections: Not on file  Intimate Partner Violence: Not on file     ROS- All systems are reviewed and negative except as per the HPI above.  Physical Exam: Vitals:   01/22/22 1117  BP: 102/66  Pulse: 74  Weight: 50.3 kg  Height: '5\' 5"'$  (1.651 m)    GEN- The patient is a well appearing female, alert and oriented x 3 today.   Head- normocephalic, atraumatic Eyes-  Sclera clear, conjunctiva pink Ears- hearing intact Oropharynx- clear Neck- supple  Lungs- Clear to ausculation bilaterally, normal work of breathing Heart- Regular rate and rhythm, no murmurs, rubs or gallops  GI- soft, NT, ND, + BS Extremities- no clubbing, cyanosis, or edema MS- no significant deformity or atrophy Skin- no rash or lesion Psych- euthymic mood, full affect Neuro- strength and sensation are intact  Wt Readings from Last 3 Encounters:  01/22/22 50.3 kg  01/15/22 50.6 kg  12/12/21 50.1 kg    EKG today demonstrates  SR, PAC Vent. rate 74 BPM PR interval 152 ms QRS duration 70 ms QT/QTcB 396/439 ms  Echo 08/30/21 demonstrated   1. Left ventricular ejection fraction, by estimation, is 40 to 45%. The  left ventricle has mildly decreased function. The left ventricle has no  regional wall motion abnormalities. Left ventricular diastolic parameters  are indeterminate.   2. Right ventricular systolic function is normal. The right ventricular  size is normal. There is normal pulmonary artery systolic pressure.   3. Moderate pericardial effusion. The pericardial effusion is  circumferential. There is no evidence of cardiac tamponade.   4. The mitral valve is normal in structure. Mild mitral valve  regurgitation. No evidence of mitral stenosis.   5. Tricuspid valve regurgitation is moderate to severe.   6. The aortic valve is  normal in structure. Aortic valve regurgitation is  mild. No aortic stenosis is present.   7. The inferior vena cava is normal in size with greater than 50%  respiratory variability, suggesting  right atrial pressure of 3 mmHg.   Comparison(s): A prior study was performed on 05/16/2021. The EF has  improved, was 25-30%, Now 40-45%. Mitral regurgitation now mild and  pericardial effusion now small.   Epic records are reviewed at length today  CHA2DS2-VASc Score = 3  The patient's score is based upon: CHF History: 1 HTN History: 0 Diabetes History: 0 Stroke History: 0 Vascular Disease History: 0 Age Score: 1 Gender Score: 1       ASSESSMENT AND PLAN: 1. Persistent Atrial Fibrillation (ICD10:  I48.19) The patient's CHA2DS2-VASc score is 3, indicating a 3.2% annual risk of stroke.   Patient presents for dofetilide admission. Continue Eliquis 5 mg BID, states no missed doses in the last 3 weeks. No recent benadryl use PharmD has screened medications QTc in SR 439 ms Labs today show creatinine at 0.81, K+ 4.0 and mag 2.4, CrCl calculated at 49 mL/min Continue Toprol 12.5 mg daily  2. Secondary Hypercoagulable State (ICD10:  D68.69) The patient is at significant risk for stroke/thromboembolism based upon her CHA2DS2-VASc Score of 3.  Continue Apixaban (Eliquis).   3. Chronic systolic CHF EF on cardiac MRI 57% Appears euvolemic today.   To be admitted later today once a bed becomes available.    [facility] [address] [place], [place] [address] [phone] 01/22/2022 11:26 AM

## 2022-01-22 NOTE — Progress Notes (Signed)
Pharmacy: Dofetilide (Tikosyn) - Initial Consult Assessment and Electrolyte Replacement  Pharmacy consulted to assist in monitoring and replacing electrolytes in this 76 y.o. female admitted on 01/22/2022 undergoing dofetilide initiation.   Assessment:  Patient Exclusion Criteria: If any screening criteria checked as "Yes", then  patient  should NOT receive dofetilide until criteria item is corrected.  If "Yes" please indicate correction plan.  YES  NO Patient  Exclusion Criteria Correction Plan   '[]'$   '[x]'$   Baseline QTc interval is greater than or equal to 440 msec. IF above YES box checked dofetilide contraindicated unless patient has ICD; then may proceed if QTc 500-550 msec or with known ventricular conduction abnormalities may proceed with QTc 550-600 msec. QTc =  439    '[]'$   '[x]'$   Patient is known or suspected to have a digoxin level greater than 2 ng/ml: No results found for: "DIGOXIN"     '[]'$   '[x]'$   Creatinine clearance less than 20 ml/min (calculated using Cockcroft-Gault, actual body weight and serum creatinine): Estimated Creatinine Clearance: 48 mL/min (by C-G formula based on SCr of 0.81 mg/dL).     '[]'$   '[x]'$  Patient has received drugs known to prolong the QT intervals within the last 48 hours (phenothiazines, tricyclics or tetracyclic antidepressants, erythromycin, H-1 antihistamines, cisapride, fluoroquinolones, azithromycin, ondansetron).   Updated information on QT prolonging agents is available to be searched on the following database:QT prolonging agents     '[]'$   '[x]'$   Patient received a dose of hydrochlorothiazide (O[place]ic) alone or in any combination including triamterene (Dyazide, Maxzide) in the last 48 hours.    '[]'$   '[x]'$  Patient received a medication known to increase dofetilide plasma concentrations prior to initial dofetilide dose:  Trimethoprim (Primsol, Proloprim) in the last 36 hours Verapamil (Calan, Verelan) in the last 36 hours or a sustained release dose  in the last 72 hours Megestrol (Megace) in the last 5 days  Cimetidine (Tagamet) in the last 6 hours Ketoconazole (Nizoral) in the last 24 hours Itraconazole (Sporanox) in the last 48 hours  Prochlorperazine (Compazine) in the last 36 hours     '[]'$   '[x]'$   Patient is known to have a history of torsades de pointes; congenital or acquired long QT syndromes.    '[]'$   '[x]'$   Patient has received a Class 1 antiarrhythmic with less than 2 half-lives since last dose. (Disopyramide, Quinidine, Procainamide, Lidocaine, Mexiletine, Flecainide, Propafenone)    '[]'$   '[x]'$   Patient has received amiodarone therapy in the past 3 months or amiodarone level is greater than 0.3 ng/ml.    Patient has been appropriately anticoagulated with apixaban.  Labs:    Component Value Date/Time   K 4.0 01/22/2022 1111   MG 2.4 01/22/2022 1111     Plan: Potassium: K >/= 4: Appropriate to initiate Tikosyn, no replacement needed    Magnesium: Mg >2: Appropriate to initiate Tikosyn, no replacement needed     Thank you for allowing pharmacy to participate in this patient's care   Hildred Laser, PharmD Clinical Pharmacist **Pharmacist phone directory can now be found on [email or web address] ([email or web address]).  Listed under [place].

## 2022-01-22 NOTE — Progress Notes (Signed)
Direct admit to room [phone]. Vital signs obtained. On monitor CCMD notified. IV inserted. Alert and oriented to room and call light. Call bell within reach.  Era Bumpers, RN

## 2022-01-22 NOTE — H&P (Signed)
Electrophysiology H&P  Note   Primary Care Physician: Leeroy Cha, MD Primary Cardiologist: Dr Ellyn Hack Primary Electrophysiologist: Dr Quentin Ore Referring Physician: Dr Sharen Hint [name] is a 76 y.o. female with a history of chronic systolic CHF, moderate to severe TR, endometrial cancer, multiple sclerosis, atrial fibrillation who presents for follow up in the [facility]. She had been maintained on amiodarone but had dysregulation of her thyroid as well as abnormal PFT and amiodarone was discontinued. Patient is on Eliquis for a CHADS2VASC score of 3. She was seen by Dr Quentin Ore 09/19/21, not felt to be a good ablation candidate given chronic pericardial effusion. Dofetilide was recommended.    On follow up today, patient presents for dofetilide admission. She denies any missed doses of anticoagulation in the past 3 weeks. She is in SR today but still has frequent paroxysms of afib at home.    Today, she denies symptoms of chest pain, shortness of breath, orthopnea, PND, lower extremity edema, dizziness, presyncope, syncope, snoring, daytime somnolence, bleeding, or neurologic sequela. The patient is tolerating medications without difficulties and is otherwise without complaint today.      Atrial Fibrillation Risk Factors:   she does not have symptoms or diagnosis of sleep apnea. she does not have a history of rheumatic fever.     she has a BMI of Body mass index is 18.47 kg/m.[name]     Weight: 50.3 kg           Family History  Problem Relation Age of Onset   Stroke Mother 14        lived 75 yrs   Congestive Heart Failure Mother 68   Fainting Father     Pneumonia Father          Thought to be aspiration pneumonia following an episode of syncope.   Migraines Father     Migraines Sister     Migraines Daughter     Migraines Granddaughter     Migraines Grandson          Atrial Fibrillation Management history:   Previous  antiarrhythmic drugs: amiodarone  Previous cardioversions: none Previous ablations: none CHADS2VASC score: 3 Anticoagulation history: Eliquis         Past Medical History:  Diagnosis Date   A-fib ([place])     Dilated cardiomyopathy ([place]) 05/28/2021    NON-ISCHEMIC -> with restoration of sinus rhythm, EF improved from 25-30% up to 40 and 45%.  Moderate-severe TR noted   Diverticulosis 2012    With intermittent diverticulitis   Endometrial cancer ([place]) 2009    S/p vaginal hysterectomy   Family history of adverse reaction to anesthesia      mother ponv   Glaucoma     Hypothyroidism due to thyroiditis treated with radioactive iodine     Idiopathic pericardial effusion 05/16/2021   Migraine     Mitral valve regurgitation due to prolapse of cusp 05/16/2021    Echo: Moderate MR with holosystolic prolapse of the posterior MV   Multiple sclerosis ([place]) 2008    Well-controlled, currently not on medications.  Repatha subcu 3 times weekly from 2008-2021.  No further relapse.   Persistent atrial fibrillation ([place]) 05/02/2021         Past Surgical History:  Procedure Laterality Date   14-day Zio Patch Monitor   05/2021    October 2022 100% A. fib rates ranging from 54 to 178 bpm.  Rare PVCs with occasional couplets.   cataracts Bilateral  COLONOSCOPY WITH PROPOFOL N/A 12/11/2015    Procedure: COLONOSCOPY WITH PROPOFOL;  Surgeon: Garlan Fair, MD;  Location: [facility];  Service: Endoscopy;  Laterality: N/A;   RIGHT/LEFT HEART CATH AND CORONARY ANGIOGRAPHY Bilateral 06/14/2021    Procedure: RIGHT/LEFT HEART CATH AND CORONARY ANGIOGRAPHY;  Surgeon: Leonie Man, MD;  Location: [facility];  Service: Cardiovascular; Angiographic normal coronary arteries.  Cardiac output-index (3.2-2.1). RAP 13/15-12 mmHg, RVP 27/3/10 mmHg; PAP-mean 30/18/23 mmHg, PCWP mean 20 mmHg.  LVEDP-EDP 122/11 mmHg-21 mmHg   salzman nodules   12/11/2014    Had one eye then the other on 01/2015    TONSILLECTOMY       TRANSTHORACIC ECHOCARDIOGRAM   05/16/2021    Severely reduced LV function-EF of 25 to 30%.  Unable assess diastolic dysfunction due to A. fib.  Mild to moderate LA dilation.  Moderate pericardial effusion.  Moderate MR with holosystolic prolapse of the posterior MV.  Normal RVP and PAP   TRANSTHORACIC ECHOCARDIOGRAM   08/30/2021    EF 40 to 45%.  No R WMA.  Moderate pericardial effusion. Mild MR and AI.  Moderate-severe TR. => Notable improvement from previous EF of 25 to 30%.   VAGINAL HYSTERECTOMY        complete            Current Outpatient Medications  Medication Sig Dispense Refill   acetaminophen (TYLENOL) 500 MG tablet Take 500-1,000 mg by mouth every 6 (six) hours as needed for moderate pain or headache.       albuterol (VENTOLIN HFA) 108 (90 Base) MCG/ACT inhaler Inhale 2 puffs into the lungs every 6 (six) hours as needed for wheezing or shortness of breath. 8 g 6   apixaban (ELIQUIS) 5 MG TABS tablet Take 5 mg by mouth 2 (two) times daily.       b complex vitamins capsule Take 1 capsule by mouth daily.       Biotin 10000 MCG TABS Take 10,000 mcg by mouth daily.       Calcium Carb-Cholecalciferol (CALCIUM 500/VITAMIN D PO) Take 2 each by mouth daily. Chewables       Cholecalciferol (VITAMIN D) 50 MCG (2000 UT) tablet Take 2,000 Units by mouth daily.       COLLAGEN PO Take 1 Scoop by mouth daily.       FLUZONE HIGH-DOSE QUADRIVALENT 0.7 ML SUSY         latanoprost (XALATAN) 0.005 % ophthalmic solution Place 1 drop into both eyes at bedtime.   6   levothyroxine (SYNTHROID) 100 MCG tablet Take 100 mcg by mouth every morning.       losartan (COZAAR) 25 MG tablet Take 1 tablet (25 mg total) by mouth daily. 30 tablet 11   magnesium oxide (MAG-OX) 400 MG tablet Take 400 mg by mouth daily.       metoprolol succinate (TOPROL XL) 25 MG 24 hr tablet Take 0.5 tablets (12.5 mg total) by mouth daily. 45 tablet 2   Misc Natural Products (GLUCOSAMINE CHOND COMPLEX/MSM PO) Take  1 tablet by mouth daily. Replenex       Prenatal Vit-Fe Fumarate-FA (PRENATAL MULTIVITAMIN) TABS tablet Take 1 tablet by mouth daily.       SUMAtriptan (IMITREX) 100 MG tablet TAKE 1 TABLET BY MOUTH AS NEEDED FOR HEADACHE MAY REPEAT IN 2 HOURS IF HEADACHE PERSIST 10 tablet 3   Vitamin A 2400 MCG (8000 UT) CAPS Take 8,000 Units by mouth daily.  zinc gluconate 50 MG tablet Take 50 mg by mouth 3 (three) times a week.        No current facility-administered medications for this encounter.           Allergies  Allergen Reactions   Penicillins Rash      As a child. Has patient had a PCN reaction causing immediate rash, facial/tongue/throat swelling, SOB or lightheadedness with hypotension: No Has patient had a PCN reaction causing severe rash involving mucus membranes or skin necrosis: No Has patient had a PCN reaction that required hospitalization: No Has patient had a PCN reaction occurring within the last 10 years: No If all of the above answers are "NO", then may proceed with Cephalosporin use.     Codeine Nausea And Vomiting      Social History         Socioeconomic History   Marital status: Married      Spouse name: [name]   Number of children: 1   Years of education: 14   Highest education level: Not on file  Occupational History      Employer: [employer]  Tobacco Use   Smoking status: Never   Smokeless tobacco: Never   Tobacco comments:      Never smoke 01/22/22  Vaping Use   Vaping Use: Never used  Substance and Sexual Activity   Alcohol use: Yes      Alcohol/week: 1.0 standard drink of alcohol      Types: 1 Glasses of wine per week      Comment: wine on occ   Drug use: No   Sexual activity: Not on file  Other Topics Concern   Not on file  Social History Narrative    [name], 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    Patient has one child -daughter.>     Patient is retired.    Patient has a college education.    Patient is right-handed.    Patient  drinks very little caffeine.    Exercise at the gym 2 to 3 days a week, walks routinely and does intermittent weights.    Social Determinants of Health    Financial Resource Strain: Not on file  Food Insecurity: Not on file  Transportation Needs: Not on file  Physical Activity: Not on file  Stress: Not on file  Social Connections: Not on file  Intimate Partner Violence: Not on file        ROS- All systems are reviewed and negative except as per the HPI above.   Physical Exam:    Vitals:    01/22/22 1117  BP: 102/66  Pulse: 74  Weight: 50.3 kg  Height: '5\' 5"'$  (1.651 m)      GEN- The patient is a well appearing female, alert and oriented x 3 today.   Head- normocephalic, atraumatic Eyes-  Sclera clear, conjunctiva pink Ears- hearing intact Oropharynx- clear Neck- supple  Lungs- Clear to ausculation bilaterally, normal work of breathing Heart- Regular rate and rhythm, no murmurs, rubs or gallops  GI- soft, NT, ND, + BS Extremities- no clubbing, cyanosis, or edema MS- no significant deformity or atrophy Skin- no rash or lesion Psych- euthymic mood, full affect Neuro- strength and sensation are intact      Wt Readings from Last 3 Encounters:  01/22/22 50.3 kg  01/15/22 50.6 kg  12/12/21 50.1 kg      EKG today demonstrates  SR, PAC Vent. rate 74 BPM PR interval 152 ms QRS duration  70 ms QT/QTcB 396/439 ms   Echo 08/30/21 demonstrated   1. Left ventricular ejection fraction, by estimation, is 40 to 45%. The  left ventricle has mildly decreased function. The left ventricle has no  regional wall motion abnormalities. Left ventricular diastolic parameters  are indeterminate.   2. Right ventricular systolic function is normal. The right ventricular  size is normal. There is normal pulmonary artery systolic pressure.   3. Moderate pericardial effusion. The pericardial effusion is  circumferential. There is no evidence of cardiac tamponade.   4. The mitral valve is  normal in structure. Mild mitral valve  regurgitation. No evidence of mitral stenosis.   5. Tricuspid valve regurgitation is moderate to severe.   6. The aortic valve is normal in structure. Aortic valve regurgitation is  mild. No aortic stenosis is present.   7. The inferior vena cava is normal in size with greater than 50%  respiratory variability, suggesting right atrial pressure of 3 mmHg.   Comparison(s): A prior study was performed on 05/16/2021. The EF has  improved, was 25-30%, Now 40-45%. Mitral regurgitation now mild and  pericardial effusion now small.    Epic records are reviewed at length today   CHA2DS2-VASc Score = 3  The patient's score is based upon: CHF History: 1 HTN History: 0 Diabetes History: 0 Stroke History: 0 Vascular Disease History: 0 Age Score: 1 Gender Score: 1     ASSESSMENT AND PLAN: 1. Persistent Atrial Fibrillation (ICD10:  I48.19) The patient's CHA2DS2-VASc score is 3, indicating a 3.2% annual risk of stroke.   Patient presents for dofetilide admission. Continue Eliquis 5 mg BID, states no missed doses in the last 3 weeks. No recent benadryl use PharmD has screened medications QTc in SR 439 ms Labs today show creatinine at 0.81, K+ 4.0 and mag 2.4, CrCl calculated at Estimated Creatinine Clearance: 49.1 mL/min (by C-G formula based on SCr of 0.81 mg/dL).  Continue Toprol 12.5 mg daily   2. Secondary Hypercoagulable State (ICD10:  D68.69){ The patient is at significant risk for stroke/thromboembolism based upon her CHA2DS2-VASc Score of 3.  Continue Apixaban (Eliquis).    3. Chronic systolic CHF EF on cardiac MRI 57% Appears euvolemic today.   Pt presents for planned tikosyn admission without change to above.   Legrand Como [address]" [place], PA-C  01/22/2022 1:02 PM

## 2022-01-22 NOTE — TOC Benefit Eligibility Note (Signed)
Patient Research scientist (life sciences) completed.     The patient is currently admitted and upon discharge could be taking Tikosyn 250 mcg.   The current 30 day co-pay is, $47.   The patient is insured through [employer].

## 2022-01-22 NOTE — Care Management (Signed)
1456 01-22-22 Patient presented for Tikosyn Load. Benefits check submitted. Case Manager will follow for cost and pharmacy of choice as the patient progresses.

## 2022-01-23 LAB — BASIC METABOLIC PANEL
Anion gap: 6 (ref 5–15)
BUN: 25 mg/dL — ABNORMAL HIGH (ref 8–23)
CO2: 25 mmol/L (ref 22–32)
Calcium: 9 mg/dL (ref 8.9–10.3)
Chloride: 108 mmol/L (ref 98–111)
Creatinine, Ser: 0.92 mg/dL (ref 0.44–1.00)
GFR, Estimated: >60 mL/min (ref >60)
Glucose, Bld: 94 mg/dL (ref 70–99)
Potassium: 3.6 mmol/L (ref 3.5–5.1)
Sodium: 139 mmol/L (ref 135–145)

## 2022-01-23 LAB — MAGNESIUM: Magnesium: 2.2 mg/dL (ref 1.7–2.4)

## 2022-01-23 MED ORDER — LOSARTAN POTASSIUM 25 MG PO TABS
12.5000 mg | ORAL_TABLET | Freq: Every day | ORAL | Status: DC
  Administered 2022-01-24 – 2022-01-25 (×2): 12.5 mg via ORAL
  Filled 2022-01-23 (×2): qty 1

## 2022-01-23 MED ORDER — POTASSIUM CHLORIDE CRYS ER 20 MEQ PO TBCR
30.0000 meq | EXTENDED_RELEASE_TABLET | ORAL | Status: AC
  Administered 2022-01-23 (×2): 30 meq via ORAL
  Filled 2022-01-23 (×2): qty 1

## 2022-01-23 NOTE — Plan of Care (Signed)

## 2022-01-23 NOTE — Progress Notes (Addendum)
Electrophysiology Rounding Note  Patient Name: [name] Date of Encounter: 01/23/2022  Primary Cardiologist: Glenetta Hew, MD  Electrophysiologist: Vickie Epley, MD    Subjective   Pt converted to sinus rhythm on Tikosyn 250 mcg BID. Had several paroxysms of AF overnight.  QTc from EKG last pm shows stable QTc at 440-450  The patient is doing well today.  At this time, the patient denies chest pain, shortness of breath, or any new concerns.  Inpatient Medications    Scheduled Meds:  apixaban  5 mg Oral BID   cholecalciferol  2,000 Units Oral Daily   dofetilide  250 mcg Oral BID   latanoprost  1 drop Both Eyes QHS   levothyroxine  100 mcg Oral Q0600   losartan  25 mg Oral Daily   magnesium oxide  400 mg Oral Daily   metoprolol succinate  12.5 mg Oral Daily   potassium chloride  30 mEq Oral Q3H   sodium chloride flush  3 mL Intravenous Q12H   Continuous Infusions:  sodium chloride     PRN Meds: sodium chloride, acetaminophen, albuterol, sodium chloride flush, SUMAtriptan   Vital Signs    Vitals:   01/22/22 1300 01/22/22 1305 01/22/22 2043 01/23/22 0532  BP:  104/64 (!) 97/56 (!) 93/58  Pulse:  68 89 62  Resp:  '16 16 16  '$ Temp:  97.8 F (36.6 C) 97.9 F (36.6 C) (!) 97.4 F (36.3 C)  TempSrc:  Oral Oral Oral  SpO2:  100% 98% 97%  Weight: 49.2 kg       Intake/Output Summary (Last 24 hours) at 01/23/2022 0725 Last data filed at 01/22/2022 2030 Gross per 24 hour  Intake 720 ml  Output --  Net 720 ml   Filed Weights   01/22/22 1300  Weight: 49.2 kg    Physical Exam    GEN- The patient is well appearing, alert and oriented x 3 today.   Head- normocephalic, atraumatic Eyes-  Sclera clear, conjunctiva pink Ears- hearing intact Oropharynx- clear Neck- supple Lungs- Clear to ausculation bilaterally, normal work of breathing Heart- Regular rate and rhythm, no murmurs, rubs or gallops GI- soft, NT, ND, + BS Extremities- no clubbing,  cyanosis, or edema Skin- no rash or lesion Psych- euthymic mood, full affect Neuro- strength and sensation are intact  Labs    CBC No results for input(s): "WBC", "NEUTROABS", "HGB", "HCT", "MCV", "PLT" in the last 72 hours. Basic Metabolic Panel Recent Labs    01/22/22 1111 01/23/22 0128  NA 140 139  K 4.0 3.6  CL 106 108  CO2 26 25  GLUCOSE 91 94  BUN 25* 25*  CREATININE 0.81 0.92  CALCIUM 9.3 9.0  MG 2.4 2.2    Potassium  Date/Time Value Ref Range Status  01/23/2022 01:28 AM 3.6 3.5 - 5.1 mmol/L Final   Magnesium  Date/Time Value Ref Range Status  01/23/2022 01:28 AM 2.2 1.7 - 2.4 mg/dL Final    Comment:    Performed at [facility], [place] [address], [place], [place] [address]    Telemetry    SB/NSR 50-60s , did have episodes of AF overnight (personally reviewed)  Radiology    No results found.   Patient Profile     [name] is a 76 y.o. female with a past medical history significant for persistent atrial fibrillation.  They were admitted for tikosyn load.   Assessment & Plan    Persistent atrial fibrillation Pt converted to  sinus rhythm on Tikosyn 250 mcg BID  Continue Eliquis K 3.6, Mg 2.2. Supp K.  CHA2DS2VASC is at least 3.  Patient will not likely require cardioversion. Plan tentatively home Friday if QT remains stable.   2. H/o CHF Most recent EF 57% Volume status stable on exam.    For questions or updates, please contact [place] Please consult [email or web address] for contact info under Cardiology/STEMI.  Signed, Shirley Friar, PA-C  01/23/2022, 7:25 AM    Atrial fibrillation persistent  Cardiomyopathy, nonischemic recovered >? Mechanism  Amio intolerance  Low BP  Renal insufficiency grade 3 a  Pt tolerating dofetilde thus far, but with recurrent afib last night.  Too early tomake predictions about success, but with recovered EF and LA size normal 1/23 would have low threshold to consider ablation    Dofetilide dosed @ 250 2/2 renal issues   Losartan and metoprolol would continue in setting of HFrecEF

## 2022-01-23 NOTE — Progress Notes (Signed)
Morning EKG reviewed    Shows remains in NSR at 64 bpm with stable QTc at ~470-480 ms.  Continue  Tikosyn 250 mcg BID.   Pt will not require DCCV   Annamaria Helling  Pager: [phone]  01/23/2022 11:04 AM

## 2022-01-23 NOTE — Progress Notes (Signed)
Patient's BP= 84/57 at 1444. Patient is asymptomatic and states this is her normal BP. Oda Kilts , [place] notified, stated he would cut her losartan in half starting tomorrow 01/24/22.

## 2022-01-23 NOTE — Progress Notes (Signed)
Pharmacy: Dofetilide (Tikosyn) - Follow Up Assessment and Electrolyte Replacement  Pharmacy consulted to assist in monitoring and replacing electrolytes in this 76 y.o. female admitted on 01/22/2022 undergoing dofetilide initiation.   Labs:    Component Value Date/Time   K 3.6 01/23/2022 0128   MG 2.2 01/23/2022 0128     Plan: Potassium: K 3.5-3.7:  Give KCl 60 mEq po x1   Magnesium: Mg > 2: No additional supplementation needed    Thank you for allowing pharmacy to participate in this patient's care   Hildred Laser, PharmD Clinical Pharmacist **Pharmacist phone directory can now be found on [email or web address] ([email or web address]).  Listed under [place].

## 2022-01-23 NOTE — Care Management (Signed)
1416 01-23-22 Case Manager spoke with patient regarding Tikosyn cost. Cost is expensive for the patient. Case Manager discussed Good Rx and patient is agreeable. Patient wants the initial Rx fill to be submitted to [facility] and Rx refills 30 day supply to be submitted to [facility] in [place]. No further needs identified at this time.

## 2022-01-24 ENCOUNTER — Other Ambulatory Visit: Payer: Self-pay

## 2022-01-24 ENCOUNTER — Encounter: Payer: Self-pay | Admitting: Cardiology

## 2022-01-24 ENCOUNTER — Encounter (HOSPITAL_COMMUNITY): Payer: Self-pay | Admitting: Cardiology

## 2022-01-24 LAB — BASIC METABOLIC PANEL
Anion gap: 8 (ref 5–15)
BUN: 20 mg/dL (ref 8–23)
CO2: 25 mmol/L (ref 22–32)
Calcium: 9.3 mg/dL (ref 8.9–10.3)
Chloride: 107 mmol/L (ref 98–111)
Creatinine, Ser: 0.66 mg/dL (ref 0.44–1.00)
GFR, Estimated: >60 mL/min (ref >60)
Glucose, Bld: 97 mg/dL (ref 70–99)
Potassium: 4.1 mmol/L (ref 3.5–5.1)
Sodium: 140 mmol/L (ref 135–145)

## 2022-01-24 LAB — MAGNESIUM: Magnesium: 2.2 mg/dL (ref 1.7–2.4)

## 2022-01-24 MED ORDER — ORAL CARE MOUTH RINSE: 15.0000 mL | OROMUCOSAL | Status: DC | PRN

## 2022-01-24 NOTE — Progress Notes (Addendum)
Morning EKG reviewed    Shows remains in NSR at 63 bpm with stable QTc at ~470-480 ms.  Continue  Tikosyn 250 mcg BID.   Plan for home tomorrow if QTc remains stable.   Shirley Friar, PA-C  Pager: [phone]  01/24/2022 11:17 AM

## 2022-01-24 NOTE — Progress Notes (Signed)
Electrophysiology Rounding Note  Patient Name: [name] Date of Encounter: 01/24/2022  Primary Cardiologist: Glenetta Hew, MD  Electrophysiologist: Vickie Epley, MD    Subjective   Pt  remains in NSR  on Tikosyn 250 mcg BID   QTc from EKG last pm shows stable QTc at ~460-470 when measured manually  The patient is doing well today.  At this time, the patient denies chest pain, shortness of breath, or any new concerns.  Inpatient Medications    Scheduled Meds:  apixaban  5 mg Oral BID   cholecalciferol  2,000 Units Oral Daily   dofetilide  250 mcg Oral BID   latanoprost  1 drop Both Eyes QHS   levothyroxine  100 mcg Oral Q0600   losartan  12.5 mg Oral Daily   magnesium oxide  400 mg Oral Daily   metoprolol succinate  12.5 mg Oral Daily   sodium chloride flush  3 mL Intravenous Q12H   Continuous Infusions:  sodium chloride     PRN Meds: sodium chloride, acetaminophen, albuterol, mouth rinse, sodium chloride flush, SUMAtriptan   Vital Signs    Vitals:   01/23/22 1444 01/23/22 1540 01/23/22 2055 01/24/22 0429  BP: (!) 84/57 (!) 89/49 (!) 97/47 (!) 103/55  Pulse: 68 70 98 60  Resp: '18 18 19 18  '$ Temp: 97.8 F (36.6 C) 97.9 F (36.6 C) 98.1 F (36.7 C) 98 F (36.7 C)  TempSrc: Oral Oral Oral Oral  SpO2: 98% 97% 96% 95%  Weight:        Intake/Output Summary (Last 24 hours) at 01/24/2022 0730 Last data filed at 01/23/2022 2000 Gross per 24 hour  Intake 900 ml  Output --  Net 900 ml   Filed Weights   01/22/22 1300  Weight: 49.2 kg    Physical Exam    GEN- The patient is well appearing, alert and oriented x 3 today.   Head- normocephalic, atraumatic Eyes-  Sclera clear, conjunctiva pink Ears- hearing intact Oropharynx- clear Neck- supple Lungs- Clear to ausculation bilaterally, normal work of breathing Heart- Regular rate and rhythm, no murmurs, rubs or gallops GI- soft, NT, ND, + BS Extremities- no clubbing, cyanosis, or  edema Skin- no rash or lesion Psych- euthymic mood, full affect Neuro- strength and sensation are intact  Labs    CBC No results for input(s): "WBC", "NEUTROABS", "HGB", "HCT", "MCV", "PLT" in the last 72 hours. Basic Metabolic Panel Recent Labs    01/23/22 0128 01/24/22 0322  NA 139 140  K 3.6 4.1  CL 108 107  CO2 25 25  GLUCOSE 94 97  BUN 25* 20  CREATININE 0.92 0.66  CALCIUM 9.0 9.3  MG 2.2 2.2    Potassium  Date/Time Value Ref Range Status  01/24/2022 03:22 AM 4.1 3.5 - 5.1 mmol/L Final   Magnesium  Date/Time Value Ref Range Status  01/24/2022 03:22 AM 2.2 1.7 - 2.4 mg/dL Final    Comment:    Performed at [facility], [place] [address], [place], [place] [address]    Telemetry    NSR [phone] (personally reviewed)  Radiology    No results found.   Patient Profile     Janasha Barkalow is a 76 y.o. female with a past medical history significant for persistent atrial fibrillation.  They were admitted for tikosyn load.   Assessment & Plan    Persistent atrial fibrillation Pt  remains in NSR  on Tikosyn 250 mcg BID  Continue Eliquis Electrolytes  stable.  CHA2DS2VASC is at least 3  Plan for home tomorrow if QTc remains stable.  For questions or updates, please contact [place] Please consult [email or web address] for contact info under Cardiology/STEMI.  Signed, Shirley Friar, PA-C  01/24/2022, 7:30 AM

## 2022-01-24 NOTE — Progress Notes (Signed)
Pharmacy: Dofetilide (Tikosyn) - Follow Up Assessment and Electrolyte Replacement  Pharmacy consulted to assist in monitoring and replacing electrolytes in this 76 y.o. female admitted on 01/22/2022 undergoing dofetilide initiation.   Labs:    Component Value Date/Time   K 4.1 01/24/2022 0322   MG 2.2 01/24/2022 0322     Plan: Potassium: K >/= 4: No additional supplementation needed  Magnesium: Mg > 2: No additional supplementation needed   -I anticipate no need for scheduled Kdur at discharge  Thank you for allowing pharmacy to participate in this patient's care    Hildred Laser, PharmD Clinical Pharmacist **Pharmacist phone directory can now be found on [email or web address] (PW [email or web address]).  Listed under [place].

## 2022-01-24 NOTE — Progress Notes (Signed)
EKG from yesterday evening 6/15 reviewed    Shows remains in NSR at 66 bpm with stable QTc at ~480 ms.  Continue  Tikosyn 250 mcg BID.   K 3.8 Mg 2.1. Supp K.   Home this afternoon if QT remains stable.     Shirley Friar, PA-C  Pager: [phone]  01/25/2022 8:50 AM

## 2022-01-24 NOTE — Progress Notes (Signed)
Patient's BP is 87/48; patient is asymptomatic and has been ambulating in hallway without difficulty. Notified Oda Kilts PA of patient's BP.

## 2022-01-25 ENCOUNTER — Other Ambulatory Visit (HOSPITAL_COMMUNITY): Payer: Self-pay

## 2022-01-25 LAB — MAGNESIUM: Magnesium: 2.1 mg/dL (ref 1.7–2.4)

## 2022-01-25 LAB — BASIC METABOLIC PANEL
Anion gap: 6 (ref 5–15)
BUN: 20 mg/dL (ref 8–23)
CO2: 24 mmol/L (ref 22–32)
Calcium: 9.1 mg/dL (ref 8.9–10.3)
Chloride: 109 mmol/L (ref 98–111)
Creatinine, Ser: 0.82 mg/dL (ref 0.44–1.00)
GFR, Estimated: >60 mL/min (ref >60)
Glucose, Bld: 98 mg/dL (ref 70–99)
Potassium: 3.8 mmol/L (ref 3.5–5.1)
Sodium: 139 mmol/L (ref 135–145)

## 2022-01-25 MED ORDER — POTASSIUM CHLORIDE CRYS ER 20 MEQ PO TBCR
40.0000 meq | EXTENDED_RELEASE_TABLET | Freq: Once | ORAL | Status: AC
  Administered 2022-01-25: 40 meq via ORAL
  Filled 2022-01-25: qty 2

## 2022-01-25 MED ORDER — POTASSIUM CHLORIDE CRYS ER 20 MEQ PO TBCR
20.0000 meq | EXTENDED_RELEASE_TABLET | Freq: Once | ORAL | Status: AC
Start: 2022-01-25 — End: 2022-01-25
  Administered 2022-01-25: 20 meq via ORAL
  Filled 2022-01-25: qty 1

## 2022-01-25 MED ORDER — DOFETILIDE 250 MCG PO CAPS
250.0000 ug | ORAL_CAPSULE | Freq: Two times a day (BID) | ORAL | 6 refills | Status: DC
  Filled 2022-01-25: qty 60, 30d supply, fill #0

## 2022-01-25 NOTE — Progress Notes (Signed)
Pt's heart rate while trying to get an ekg dropped to 38. Pt states that her heart felt funny and then the feeling went away. Looks like pt's heart rate is back in the 60's. EP paged.

## 2022-01-25 NOTE — Progress Notes (Signed)
Patient alert and oriented, verbalized understanding of dc instrucitons. All belongings given to patient. Ccmd notified.

## 2022-01-25 NOTE — Care Management Important Message (Signed)
Important Message  Patient Details  Name: [name] MRN: [id number] Date of Birth: 04-28-1946   Medicare Important Message Given:  Yes     Orbie Pyo 01/25/2022, 2:54 PM

## 2022-01-25 NOTE — Discharge Summary (Signed)
ELECTROPHYSIOLOGY PROCEDURE DISCHARGE SUMMARY    Patient ID: [name],  MRN: [id number], DOB/AGE: 1946/06/25 76 y.o.  Admit date: 01/22/2022 Discharge date: 01/25/2022  Primary Care Physician: Leeroy Cha, MD  Primary Cardiologist: Glenetta Hew, MD  Electrophysiologist: Vickie Epley, MD   Primary Discharge Diagnosis:  1.  Persistent atrial fibrillation status post Tikosyn loading this admission    Allergies  Allergen Reactions   Penicillins Rash    As a child. Has patient had a PCN reaction causing immediate rash, facial/tongue/throat swelling, SOB or lightheadedness with hypotension: No Has patient had a PCN reaction causing severe rash involving mucus membranes or skin necrosis: No Has patient had a PCN reaction that required hospitalization: No Has patient had a PCN reaction occurring within the last 10 years: No If all of the above answers are "NO", then may proceed with Cephalosporin use.    Codeine Nausea And Vomiting     Procedures This Admission:  1.  Tikosyn loading  Brief HPI: [name] is a 76 y.o. female with a past medical history as noted above.  They were referred to EP in the outpatient setting for treatment options of atrial fibrillation.  Risks, benefits, and alternatives to Tikosyn were reviewed with the patient who wished to proceed.    Hospital Course:  The patient was admitted and Tikosyn was initiated.  Renal function and electrolytes were followed during the hospitalization.  Their QTc remained stable. She was in and out of atrial fibrillation and did not require direct current cardioversion. She was in sinus rhythm at time of discharge.  They were monitored until discharge on telemetry which demonstrated NSR. With rare paroxysms of atrial fibrillation that improved as tikosyn load continued.  On the day of discharge, they were examined by Dr. Quentin Ore  who considered them stable for discharge to home.   Follow-up has been arranged with the Atrial Fibrillation clinic in approximately 1 week and with  EP APP   in 4 weeks.   RN note reporting pt had episode of bradycardia prior to d/c on tele. This was due to tele artifact and auto-adjusting causing undersensing. There was some ectopy but no significant bradycardia.   Physical Exam: Vitals:   01/24/22 1700 01/24/22 2017 01/25/22 0524 01/25/22 0832  BP: (!) 91/53 (!) 90/51 (!) 103/48 109/61  Pulse:  68 62 68  Resp:  16    Temp:  97.6 F (36.4 C) 98.1 F (36.7 C)   TempSrc:  Oral Oral   SpO2:  99% 97%   Weight:      Height:        GEN- The patient is well appearing, alert and oriented x 3 today.   HEENT: normocephalic, atraumatic; sclera clear, conjunctiva pink; hearing intact; oropharynx clear; neck supple, no JVP Lymph- no cervical lymphadenopathy Lungs- Clear to ausculation bilaterally, normal work of breathing.  No wheezes, rales, rhonchi Heart- Regular rate and rhythm, no murmurs, rubs or gallops, PMI not laterally displaced GI- soft, non-tender, non-distended, bowel sounds present, no hepatosplenomegaly Extremities- no clubbing, cyanosis, or edema; DP/PT/radial pulses 2+ bilaterally MS- no significant deformity or atrophy Skin- warm and dry, no rash or lesion Psych- euthymic mood, full affect Neuro- strength and sensation are intact   Labs:   Lab Results  Component Value Date   WBC 5.6 09/19/2021   HGB 12.2 09/19/2021   HCT 35.7 09/19/2021   MCV 100 (H) 09/19/2021   PLT 232 09/19/2021    Recent Labs  Lab 01/25/22 0232  NA 139  K 3.8  CL 109  CO2 24  BUN 20  CREATININE 0.82  CALCIUM 9.1  GLUCOSE 98     Discharge Medications:  Allergies as of 01/25/2022       Reactions   Penicillins Rash   As a child. Has patient had a PCN reaction causing immediate rash, facial/tongue/throat swelling, SOB or lightheadedness with hypotension: No Has patient had a PCN reaction causing severe rash involving mucus membranes  or skin necrosis: No Has patient had a PCN reaction that required hospitalization: No Has patient had a PCN reaction occurring within the last 10 years: No If all of the above answers are "NO", then may proceed with Cephalosporin use.   Codeine Nausea And Vomiting        Medication List     TAKE these medications    acetaminophen 500 MG tablet Commonly known as: TYLENOL Take 500-1,000 mg by mouth every 6 (six) hours as needed for moderate pain or headache.   albuterol 108 (90 Base) MCG/ACT inhaler Commonly known as: VENTOLIN HFA Inhale 2 puffs into the lungs every 6 (six) hours as needed for wheezing or shortness of breath.   apixaban 5 MG Tabs tablet Commonly known as: ELIQUIS Take 5 mg by mouth 2 (two) times daily.   b complex vitamins capsule Take 1 capsule by mouth daily.   Biotin 10000 MCG Tabs Take 10,000 mcg by mouth daily.   CALCIUM 500/VITAMIN D PO Take 2 each by mouth daily. Chewables   COLLAGEN PO Take 1 Scoop by mouth daily.   dofetilide 250 MCG capsule Commonly known as: TIKOSYN Take 1 capsule (250 mcg total) by mouth 2 (two) times daily.   GLUCOSAMINE CHOND COMPLEX/MSM PO Take 1 tablet by mouth daily. Replenex   latanoprost 0.005 % ophthalmic solution Commonly known as: XALATAN Place 1 drop into both eyes at bedtime.   levothyroxine 100 MCG tablet Commonly known as: SYNTHROID Take 100 mcg by mouth every morning.   losartan 25 MG tablet Commonly known as: Cozaar Take 1 tablet (25 mg total) by mouth daily.   magnesium oxide 400 MG tablet Commonly known as: MAG-OX Take 400 mg by mouth daily.   metoprolol succinate 25 MG 24 hr tablet Commonly known as: Toprol XL Take 0.5 tablets (12.5 mg total) by mouth daily.   prenatal multivitamin Tabs tablet Take 1 tablet by mouth daily.   SUMAtriptan 100 MG tablet Commonly known as: IMITREX TAKE 1 TABLET BY MOUTH AS NEEDED FOR HEADACHE MAY REPEAT IN 2 HOURS IF HEADACHE PERSIST   Vitamin A 2400 MCG  (8000 UT) Caps Take 8,000 Units by mouth daily.   Vitamin D 50 MCG (2000 UT) tablet Take 2,000 Units by mouth daily.   zinc gluconate 50 MG tablet Take 50 mg by mouth 3 (three) times a week.        Disposition:    Follow-up Information      [facility] Follow up.   Specialty: Cardiology Why: on 6/23 at 230 for post tikosyn check Contact information: [address] [id number] [place] [address] [phone]                Duration of Discharge Encounter: Greater than 30 minutes including physician time.  Jacalyn Lefevre, PA-C  01/25/2022 11:40 AM

## 2022-01-30 ENCOUNTER — Other Ambulatory Visit: Payer: Self-pay | Admitting: Internal Medicine

## 2022-01-30 ENCOUNTER — Ambulatory Visit
Admission: RE | Admit: 2022-01-30 | Discharge: 2022-01-30 | Disposition: A | Discharge status: Home / Self Care | Payer: Medicare Other | Source: Ambulatory Visit | Attending: Internal Medicine | Admitting: Internal Medicine

## 2022-01-30 DIAGNOSIS — S6990XA Unspecified injury of unspecified wrist, hand and finger(s), initial encounter: Secondary | ICD-10-CM | POA: Diagnosis not present

## 2022-01-30 DIAGNOSIS — I480 Paroxysmal atrial fibrillation: Secondary | ICD-10-CM | POA: Diagnosis not present

## 2022-01-30 DIAGNOSIS — M7989 Other specified soft tissue disorders: Secondary | ICD-10-CM | POA: Diagnosis not present

## 2022-01-30 DIAGNOSIS — M79644 Pain in right finger(s): Secondary | ICD-10-CM | POA: Diagnosis not present

## 2022-02-01 ENCOUNTER — Ambulatory Visit (HOSPITAL_COMMUNITY)
Admission: RE | Admit: 2022-02-01 | Discharge: 2022-02-01 | Disposition: A | Discharge status: Home / Self Care | Payer: Medicare Other | Source: Ambulatory Visit | Attending: Nurse Practitioner | Admitting: Nurse Practitioner

## 2022-02-01 ENCOUNTER — Encounter (HOSPITAL_COMMUNITY): Payer: Self-pay | Admitting: Nurse Practitioner

## 2022-02-01 VITALS — BP 110/58 | HR 64 | Ht 65.0 in | Wt 110.0 lb

## 2022-02-01 DIAGNOSIS — Z8249 Family history of ischemic heart disease and other diseases of the circulatory system: Secondary | ICD-10-CM | POA: Diagnosis not present

## 2022-02-01 DIAGNOSIS — Z8542 Personal history of malignant neoplasm of other parts of uterus: Secondary | ICD-10-CM | POA: Insufficient documentation

## 2022-02-01 DIAGNOSIS — Z79899 Other long term (current) drug therapy: Secondary | ICD-10-CM | POA: Insufficient documentation

## 2022-02-01 DIAGNOSIS — Z7901 Long term (current) use of anticoagulants: Secondary | ICD-10-CM | POA: Insufficient documentation

## 2022-02-01 DIAGNOSIS — D6869 Other thrombophilia: Secondary | ICD-10-CM | POA: Diagnosis not present

## 2022-02-01 DIAGNOSIS — I5022 Chronic systolic (congestive) heart failure: Secondary | ICD-10-CM | POA: Insufficient documentation

## 2022-02-01 DIAGNOSIS — G35 Multiple sclerosis: Secondary | ICD-10-CM | POA: Insufficient documentation

## 2022-02-01 DIAGNOSIS — I4819 Other persistent atrial fibrillation: Secondary | ICD-10-CM | POA: Insufficient documentation

## 2022-02-01 LAB — BASIC METABOLIC PANEL
Anion gap: 6 (ref 5–15)
BUN: 18 mg/dL (ref 8–23)
CO2: 28 mmol/L (ref 22–32)
Calcium: 9.3 mg/dL (ref 8.9–10.3)
Chloride: 105 mmol/L (ref 98–111)
Creatinine, Ser: 0.76 mg/dL (ref 0.44–1.00)
GFR, Estimated: >60 mL/min (ref >60)
Glucose, Bld: 94 mg/dL (ref 70–99)
Potassium: 3.5 mmol/L (ref 3.5–5.1)
Sodium: 139 mmol/L (ref 135–145)

## 2022-02-01 LAB — MAGNESIUM: Magnesium: 2.3 mg/dL (ref 1.7–2.4)

## 2022-02-01 MED ORDER — DOFETILIDE 250 MCG PO CAPS: 250.0000 ug | ORAL_CAPSULE | Freq: Two times a day (BID) | ORAL | 6 refills | Status: DC

## 2022-02-04 ENCOUNTER — Other Ambulatory Visit (HOSPITAL_COMMUNITY): Payer: Self-pay

## 2022-02-04 MED ORDER — POTASSIUM CHLORIDE CRYS ER 20 MEQ PO TBCR: EXTENDED_RELEASE_TABLET | ORAL | 1 refills | Status: DC

## 2022-02-14 ENCOUNTER — Ambulatory Visit (HOSPITAL_COMMUNITY)
Admission: RE | Admit: 2022-02-14 | Discharge: 2022-02-14 | Disposition: A | Discharge status: Home / Self Care | Payer: Medicare Other | Source: Ambulatory Visit | Attending: Nurse Practitioner | Admitting: Nurse Practitioner

## 2022-02-14 DIAGNOSIS — I4819 Other persistent atrial fibrillation: Secondary | ICD-10-CM | POA: Diagnosis not present

## 2022-02-14 LAB — BASIC METABOLIC PANEL
Anion gap: 8 (ref 5–15)
BUN: 23 mg/dL (ref 8–23)
CO2: 28 mmol/L (ref 22–32)
Calcium: 9.7 mg/dL (ref 8.9–10.3)
Chloride: 102 mmol/L (ref 98–111)
Creatinine, Ser: 0.88 mg/dL (ref 0.44–1.00)
GFR, Estimated: >60 mL/min (ref >60)
Glucose, Bld: 110 mg/dL — ABNORMAL HIGH (ref 70–99)
Potassium: 4.7 mmol/L (ref 3.5–5.1)
Sodium: 138 mmol/L (ref 135–145)

## 2022-02-20 DIAGNOSIS — Z961 Presence of intraocular lens: Secondary | ICD-10-CM | POA: Diagnosis not present

## 2022-02-20 DIAGNOSIS — H04123 Dry eye syndrome of bilateral lacrimal glands: Secondary | ICD-10-CM | POA: Diagnosis not present

## 2022-02-20 DIAGNOSIS — H401231 Low-tension glaucoma, bilateral, mild stage: Secondary | ICD-10-CM | POA: Diagnosis not present

## 2022-03-05 DIAGNOSIS — I10 Essential (primary) hypertension: Secondary | ICD-10-CM | POA: Diagnosis not present

## 2022-03-05 DIAGNOSIS — G35 Multiple sclerosis: Secondary | ICD-10-CM | POA: Diagnosis not present

## 2022-03-05 DIAGNOSIS — I700 Atherosclerosis of aorta: Secondary | ICD-10-CM | POA: Diagnosis not present

## 2022-03-05 DIAGNOSIS — M85859 Other specified disorders of bone density and structure, unspecified thigh: Secondary | ICD-10-CM | POA: Diagnosis not present

## 2022-03-05 DIAGNOSIS — L602 Onychogryphosis: Secondary | ICD-10-CM | POA: Diagnosis not present

## 2022-03-05 DIAGNOSIS — K5790 Diverticulosis of intestine, part unspecified, without perforation or abscess without bleeding: Secondary | ICD-10-CM | POA: Diagnosis not present

## 2022-03-05 DIAGNOSIS — I480 Paroxysmal atrial fibrillation: Secondary | ICD-10-CM | POA: Diagnosis not present

## 2022-03-05 DIAGNOSIS — Z0000 Encounter for general adult medical examination without abnormal findings: Secondary | ICD-10-CM | POA: Diagnosis not present

## 2022-03-05 DIAGNOSIS — E039 Hypothyroidism, unspecified: Secondary | ICD-10-CM | POA: Diagnosis not present

## 2022-03-05 DIAGNOSIS — D6859 Other primary thrombophilia: Secondary | ICD-10-CM | POA: Diagnosis not present

## 2022-03-07 NOTE — Progress Notes (Signed)
PCP:  Leeroy Cha, MD Primary Cardiologist: Glenetta Hew, MD Electrophysiologist: Vickie Epley, MD   [name] is a 76 y.o. female seen today for Vickie Epley, MD for routine electrophysiology followup.  Since last being seen in our clinic the patient reports doing very well. She had an episode of AF per her apple watch on 7/11, otherwise has been quiescent. She was asymptomatic.   she denies chest pain, palpitations, dyspnea, PND, orthopnea, nausea, vomiting, dizziness, syncope, edema, weight gain, or early satiety.  Past Medical History:  Diagnosis Date   A-fib ([place])    Dilated cardiomyopathy ([place]) 05/28/2021   NON-ISCHEMIC -> with restoration of sinus rhythm, EF improved from 25-30% up to 40 and 45%.  Moderate-severe TR noted   Diverticulosis 2012   With intermittent diverticulitis   Endometrial cancer ([place]) 2009   S/p vaginal hysterectomy   Family history of adverse reaction to anesthesia    mother ponv   Glaucoma    Hypothyroidism due to thyroiditis treated with radioactive iodine    Idiopathic pericardial effusion 05/16/2021   Migraine    Mitral valve regurgitation due to prolapse of cusp 05/16/2021   Echo: Moderate MR with holosystolic prolapse of the posterior MV   Multiple sclerosis ([place]) 2008   Well-controlled, currently not on medications.  Repatha subcu 3 times weekly from 2008-2021.  No further relapse.   Persistent atrial fibrillation ([place]) 05/02/2021   Past Surgical History:  Procedure Laterality Date   14-day Zio Patch Monitor  05/2021   October 2022 100% A. fib rates ranging from 54 to 178 bpm.  Rare PVCs with occasional couplets.   cataracts Bilateral    COLONOSCOPY WITH PROPOFOL N/A 12/11/2015   Procedure: COLONOSCOPY WITH PROPOFOL;  Surgeon: Garlan Fair, MD;  Location: [facility];  Service: Endoscopy;  Laterality: N/A;   RIGHT/LEFT HEART CATH AND CORONARY ANGIOGRAPHY Bilateral 06/14/2021   Procedure: RIGHT/LEFT  HEART CATH AND CORONARY ANGIOGRAPHY;  Surgeon: Leonie Man, MD;  Location: [facility];  Service: Cardiovascular; Angiographic normal coronary arteries.  Cardiac output-index (3.2-2.1). RAP 13/15-12 mmHg, RVP 27/3/10 mmHg; PAP-mean 30/18/23 mmHg, PCWP mean 20 mmHg.  LVEDP-EDP 122/11 mmHg-21 mmHg   salzman nodules  12/11/2014   Had one eye then the other on 01/2015   TONSILLECTOMY     TRANSTHORACIC ECHOCARDIOGRAM  05/16/2021   Severely reduced LV function-EF of 25 to 30%.  Unable assess diastolic dysfunction due to A. fib.  Mild to moderate LA dilation.  Moderate pericardial effusion.  Moderate MR with holosystolic prolapse of the posterior MV.  Normal RVP and PAP   TRANSTHORACIC ECHOCARDIOGRAM  08/30/2021   EF 40 to 45%.  No R WMA.  Moderate pericardial effusion. Mild MR and AI.  Moderate-severe TR. => Notable improvement from previous EF of 25 to 30%.   VAGINAL HYSTERECTOMY     complete    Current Outpatient Medications  Medication Sig Dispense Refill   acetaminophen (TYLENOL) 500 MG tablet Take 500-1,000 mg by mouth every 6 (six) hours as needed for moderate pain or headache.     albuterol (VENTOLIN HFA) 108 (90 Base) MCG/ACT inhaler Inhale 2 puffs into the lungs every 6 (six) hours as needed for wheezing or shortness of breath. 8 g 6   apixaban (ELIQUIS) 5 MG TABS tablet Take 5 mg by mouth 2 (two) times daily.     b complex vitamins capsule Take 1 capsule by mouth daily.     Biotin 10000 MCG TABS Take  10,000 mcg by mouth daily.     Calcium Carb-Cholecalciferol (CALCIUM 500/VITAMIN D PO) Take 2 each by mouth daily. Chewables     Cholecalciferol (VITAMIN D) 50 MCG (2000 UT) tablet Take 2,000 Units by mouth daily.     COLLAGEN PO Take 1 Scoop by mouth daily.     dofetilide (TIKOSYN) 250 MCG capsule Take 1 capsule (250 mcg total) by mouth 2 (two) times daily. 60 capsule 6   latanoprost (XALATAN) 0.005 % ophthalmic solution Place 1 drop into both eyes at bedtime.  6   levothyroxine  (SYNTHROID) 100 MCG tablet Take 100 mcg by mouth every morning.     losartan (COZAAR) 25 MG tablet Take 1 tablet (25 mg total) by mouth daily. 30 tablet 11   magnesium oxide (MAG-OX) 400 MG tablet Take 400 mg by mouth daily.     metoprolol succinate (TOPROL XL) 25 MG 24 hr tablet Take 0.5 tablets (12.5 mg total) by mouth daily. 45 tablet 2   Misc Natural Products (GLUCOSAMINE CHOND COMPLEX/MSM PO) Take 1 tablet by mouth daily. Replenex     potassium chloride SA (KLOR-CON M20) 20 MEQ tablet Take one tablet by mouth once daily 90 tablet 1   Prenatal Vit-Fe Fumarate-FA (PRENATAL MULTIVITAMIN) TABS tablet Take 1 tablet by mouth daily.     SUMAtriptan (IMITREX) 100 MG tablet TAKE 1 TABLET BY MOUTH AS NEEDED FOR HEADACHE MAY REPEAT IN 2 HOURS IF HEADACHE PERSIST 10 tablet 3   Vitamin A 2400 MCG (8000 UT) CAPS Take 8,000 Units by mouth daily.     zinc gluconate 50 MG tablet Take 50 mg by mouth 3 (three) times a week.     No current facility-administered medications for this visit.    Allergies  Allergen Reactions   Penicillins Rash    As a child. Has patient had a PCN reaction causing immediate rash, facial/tongue/throat swelling, SOB or lightheadedness with hypotension: No Has patient had a PCN reaction causing severe rash involving mucus membranes or skin necrosis: No Has patient had a PCN reaction that required hospitalization: No Has patient had a PCN reaction occurring within the last 10 years: No If all of the above answers are "NO", then may proceed with Cephalosporin use.    Codeine Nausea And Vomiting    Social History   Socioeconomic History   Marital status: Married    Spouse name: [name]   Number of children: 1   Years of education: 14   Highest education level: Not on file  Occupational History    Employer: [employer]  Tobacco Use   Smoking status: Never   Smokeless tobacco: Never   Tobacco comments:    Never smoke 01/22/22  Vaping Use   Vaping Use: Never used   Substance and Sexual Activity   Alcohol use: Yes    Alcohol/week: 1.0 standard drink of alcohol    Types: 1 Glasses of wine per week    Comment: wine on occ   Drug use: No   Sexual activity: Not on file  Other Topics Concern   Not on file  Social History Narrative   [name], 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   Patient has one child -daughter.>    Patient is retired.   Patient has a college education.   Patient is right-handed.   Patient drinks very little caffeine.   Exercise at the gym 2 to 3 days a week, walks routinely and does intermittent weights.   Social Determinants of Health  Financial Resource Strain: Not on file  Food Insecurity: Not on file  Transportation Needs: Not on file  Physical Activity: Not on file  Stress: Not on file  Social Connections: Not on file  Intimate Partner Violence: Not on file     Review of Systems: All other systems reviewed and are otherwise negative except as noted above.  Physical Exam: Vitals:   03/11/22 1144  BP: 98/60  Pulse: 71  SpO2: 97%  Weight: 109 lb (49.4 kg)  Height: 5' 4.5" (1.638 m)    GEN- The patient is well appearing, alert and oriented x 3 today.   HEENT: normocephalic, atraumatic; sclera clear, conjunctiva pink; hearing intact; oropharynx clear; neck supple, no JVP Lymph- no cervical lymphadenopathy Lungs- Clear to ausculation bilaterally, normal work of breathing.  No wheezes, rales, rhonchi Heart- Regular rate and rhythm, no murmurs, rubs or gallops, PMI not laterally displaced GI- soft, non-tender, non-distended, bowel sounds present, no hepatosplenomegaly Extremities- no clubbing, cyanosis, or edema; DP/PT/radial pulses 2+ bilaterally MS- no significant deformity or atrophy Skin- warm and dry, no rash or lesion Psych- euthymic mood, full affect Neuro- strength and sensation are intact  EKG is ordered. Personal review of EKG from today shows NSR at 71 bpm with stable QTc  Additional  studies reviewed include: Previous EP and AF office notes.   Assessment and Plan:  1. Persistent Atrial Fibrillation (ICD10:  I48.19) The patient's CHA2DS2-VASc score is 3, indicating a 3.2% annual risk of stroke.   EKG today shows NSR with stable QT Continue Eliquis 5 mg BID, states no missed doses  Bmet/mag today.  Continue Toprol 12.5 mg daily   2. Secondary Hypercoagulable State (ICD10:  D68.69) The patient is at significant risk for stroke/thromboembolism based upon her CHA2DS2-VASc Score of 3.   Continue Apixaban (Eliquis).    3. Chronic systolic CHF EF on cardiac MRI 57% Volume status stable on exam   Follow up with Dr. Quentin Ore in 3 months  Shirley Friar, PA-C  03/11/22 11:54 AM

## 2022-03-11 ENCOUNTER — Encounter: Payer: Self-pay | Admitting: Student

## 2022-03-11 ENCOUNTER — Ambulatory Visit: Payer: Medicare Other | Admitting: Student

## 2022-03-11 VITALS — BP 98/60 | HR 71 | Ht 64.5 in | Wt 109.0 lb

## 2022-03-11 DIAGNOSIS — I4819 Other persistent atrial fibrillation: Secondary | ICD-10-CM | POA: Diagnosis not present

## 2022-03-11 DIAGNOSIS — I5022 Chronic systolic (congestive) heart failure: Secondary | ICD-10-CM

## 2022-03-11 DIAGNOSIS — D6869 Other thrombophilia: Secondary | ICD-10-CM | POA: Diagnosis not present

## 2022-03-11 LAB — BASIC METABOLIC PANEL
BUN/Creatinine Ratio: 25 (ref 12–28)
BUN: 19 mg/dL (ref 8–27)
CO2: 28 mmol/L (ref 20–29)
Calcium: 9.9 mg/dL (ref 8.7–10.3)
Chloride: 103 mmol/L (ref 96–106)
Creatinine, Ser: 0.76 mg/dL (ref 0.57–1.00)
Glucose: 115 mg/dL — ABNORMAL HIGH (ref 70–99)
Potassium: 4.3 mmol/L (ref 3.5–5.2)
Sodium: 142 mmol/L (ref 134–144)
eGFR: 83 mL/min/{1.73_m2} (ref >59)

## 2022-03-11 LAB — MAGNESIUM: Magnesium: 2.3 mg/dL (ref 1.6–2.3)

## 2022-03-11 NOTE — Patient Instructions (Signed)
Medication Instructions:  Your physician recommends that you continue on your current medications as directed. Please refer to the Current Medication list given to you today.  *If you need a refill on your cardiac medications before your next appointment, please call your pharmacy*   Lab Work: TODAY: BMET, Mag  If you have labs (blood work) drawn today and your tests are completely normal, you will receive your results only by: MyChart Message (if you have MyChart) OR A paper copy in the mail If you have any lab test that is abnormal or we need to change your treatment, we will call you to review the results.   Follow-Up: At [facility], you and your health needs are our priority.  As part of our continuing mission to provide you with exceptional heart care, we have created designated Provider Care Teams.  These Care Teams include your primary Cardiologist (physician) and Advanced Practice Providers (APPs -  Physician Assistants and Nurse Practitioners) who all work together to provide you with the care you need, when you need it.   Your next appointment:   3 month(s)  The format for your next appointment:   In Person  Provider:   Cameron Lambert, MD{   

## 2022-03-15 ENCOUNTER — Encounter: Payer: Self-pay | Admitting: Cardiology

## 2022-03-15 ENCOUNTER — Ambulatory Visit: Payer: Medicare Other | Admitting: Cardiology

## 2022-03-15 VITALS — BP 100/58 | HR 92 | Ht 64.5 in | Wt 108.0 lb

## 2022-03-15 DIAGNOSIS — I4819 Other persistent atrial fibrillation: Secondary | ICD-10-CM | POA: Diagnosis not present

## 2022-03-15 DIAGNOSIS — I340 Nonrheumatic mitral (valve) insufficiency: Secondary | ICD-10-CM | POA: Diagnosis not present

## 2022-03-15 DIAGNOSIS — I3139 Other pericardial effusion (noninflammatory): Secondary | ICD-10-CM | POA: Diagnosis not present

## 2022-03-15 DIAGNOSIS — D6869 Other thrombophilia: Secondary | ICD-10-CM

## 2022-03-15 DIAGNOSIS — I428 Other cardiomyopathies: Secondary | ICD-10-CM

## 2022-03-15 DIAGNOSIS — I341 Nonrheumatic mitral (valve) prolapse: Secondary | ICD-10-CM | POA: Diagnosis not present

## 2022-03-15 NOTE — Progress Notes (Signed)
Primary Care Provider: Leeroy Cha, MD Cardiologist: Mandy Hew, MD Electrophysiologist: Mandy Epley, MD  Clinic Note: Chief Complaint  Patient presents with   Follow-up    6 months; has been followed over the last several months by A-fib clinic/EP   Atrial Fibrillation    Recently started on Tikosyn-admitted in June for titration   Cardiomyopathy    Pretty much asymptomatic, doing much better since starting Tikosyn.  ===================================  ASSESSMENT/PLAN   Problem List Items Addressed This Visit       Cardiology Problems   Idiopathic pericardial effusion (Chronic)    Still noted on the cardiac MRI.  Without any active symptoms, normal demeanor necessarily need to treat but will continue to follow.  We will reassess with echocardiogram in a year.      Nonischemic cardiomyopathy (HCC) (Chronic)    Thankfully, this appears to have fully resolved based on the cardiac MRI.  Clearly not having any heart failure symptoms at this point. She does have persistent pericardial effusion.  Plan: Continue low-dose Toprol and losartan. Euvolemic-no diuretic.  To confirm stable EF and reassess effusion, will check a new baseline echocardiogram at the end of December      Relevant Orders   ECHOCARDIOGRAM COMPLETE   Hypercoagulable state due to persistent atrial fibrillation : CHA2DS2-VASc score 4 (Chronic)    Currently rate/rhythm controlled on Tikosyn. With (proximal) persistent A-fib high risk for stroke-CHA2DS2-VASc score 4.  On Eliquis 5 mg twice daily.      Persistent atrial fibrillation (HCC) -> CHA2DS2-VASc score 4 (CHF, aortic plaque, Age 54, female) - Primary (Chronic)    Has now been loaded on Tikosyn, she is doing well.  As far as she can tell may be only 1 other episode since the initial posthospital follow-up where heart rate past.  She really has been otherwise doing very well.  No adverse effects of the Tikosyn noted.  No early  satiety or nausea or dyspnea.  Plan: Continue dofetilide 250 mg twice daily along with low-dose Toprol 12.5 mg daily. Continue apixaban 5 mg twice daily. Continue afterload reduction with losartan.  (She has been having a little bit of a cough.  If this persist, may want to consider switching to a different ARB.)      Relevant Orders   ECHOCARDIOGRAM COMPLETE   Mitral valve regurgitation due to prolapse of cusp (Chronic)    Moderate MR noted on echo, not commented on MRI.  We will reassess with 2D echo      Relevant Orders   ECHOCARDIOGRAM COMPLETE    ===================================  HPI:    [name] is a 76 y.o. female with a PMH notable for Symptomatic Persistent A-Fib complicated by Cardiomyopathy (with moderate-severe TR) and idiopathic Pericardial Effusion who presents today for a 69-monthfollow-up at the request of [name],*.  I last saw [name] January 24 to discuss results of echocardiogram showing reduced EF but improved from previous EF of 25 to 30%.  She is having relatively short-lived fluttering episodes but also can tell.  She was recovering from a bout of COVID that she had over the holidays.  Besides noticing nausea vomiting and fatigue she only had really a headache but no real URI symptoms.  No progression or exacerbation of her A-fib.  Deconditioned with trivial edema but had started walking with her husband. I had previously started her on amiodarone and reduce the dose of her Toprol.  It seemed to be working pretty  well at this visit, but we are ordering follow-up safety labs at the time of the visit in March. She was feeling somewhat improved symptom wise being out of A-fib on amiodarone and therefore I recommended referral to EP (Dr. Quentin Olson) to discuss possibility of ablation or converting from amiodarone due to medications such as Tikosyn.  I started cutting back her Toprol dose to 12.5 mg daily. Thyroid panel,  ESR, CRP and PFTs checked along with lipid panel. Plan was to repeat assess EF once out of A-fib.  Lead to show benefit being out of A-fib.  She was seen in initial consult by Dr. Quentin Olson on February 8.  On review of her TFTs and PFTs there was some irregularities that necessitated stopping amiodarone. => Felt to be in NYHA Class II heart failure.  He agreed that most likely the cardiomyopathy was related to tachycardia and agreed that restoring sinus rhythm and maintaining it was warranted. => Plan was to consider dofetilide/Tikosyn if A-fib became an issue again once she came off of.  (The persistence of effusion with unclear cause [place] that she would not be a candidate for catheter ablation) => he agreed with consideration of cardiac MRI. He saw her again on May 8-unfortunately she had been ill over the past week or so with a cold.  But no fevers or chills.  COVID-negative.  Unfortunately, she was having increasing burden of A-fib with heart rates in the 100 still as high as 170.  Really the only symptom she noted was the sensation of heart rate going fast but no real chest pain or dyspnea. With amiodarone out of her system, he felt that dofetilide/Tikosyn was a better option going forward Seen by [name] in the A-fib clinic on 01/22/2022.  [name] H&P to initiate Tikosyn load.  Was actually in sinus rhythm at that time.  Recent Hospitalizations:  01/22/2022: Admitted for Tikosyn load  Seen for hospital follow-up at the [facility] by Mandy Palau, NP was still having frequent paroxysmal A-fib at home.  Stable QT: EKGs.  Continued current meds. Most recent follow-up was with[place] in [place] on July 31.  At that point it was noted that she had only had 1 episode that appeared to be possible A-fib on the Apple Watch since the initial posthospital evaluation.  Relatively asymptomatic.  Stable QT  Reviewed  CV studies:    The following studies were reviewed today: (if available,  images/films reviewed: From Epic Chart or Care Everywhere) Cardiac MRI 10/26/2021:: Normal BiV size and function.  LV EF 57%, RVEF 56%.  No late enhancement to suggest myocardial scar.  She continues to have a moderate 18 mm effusion adjacent to the LV inferior wall.  Interval History:   Dorianne Perret Episcopo returns today feeling well.  She 29 doing well with the Tikosyn.  Has not had any breakthrough episodes of A-fib that she can tell.  She is not really having any real exertional dyspnea or chest pain or pressure.  She says she had 1 or maybe 2 short little episodes of fast heart rates since the June initiation of Tikosyn.  This is only seen on her Apple Watch monitor, and she denied have any symptoms.  Is usually not till she is in A-fib for a persistent amount of time before she gets symptomatic.  No heart failure symptoms of PND, orthopnea edema.  No unexplained weight gain or loss.  No rest or exertional chest pain or pressure.  No syncope/near syncope  or TIA symptoms..  No claudication. No abnormal nausea vomiting or early satiety/GERD.  REVIEWED OF SYSTEMS   Review of Systems  Constitutional:  Negative for malaise/fatigue (Definitely has more energy off of the amiodarone and on low-dose beta-blocker.) and weight loss.  HENT:  Positive for congestion. Negative for nosebleeds.   Respiratory:  Positive for cough. Negative for shortness of breath and wheezing.   Genitourinary:  Positive for dysuria. Negative for frequency and hematuria.  Musculoskeletal:  Positive for back pain. Negative for joint pain (Knees and hips).  Neurological:  Positive for dizziness. Negative for seizures.  Psychiatric/Behavioral:  Negative for depression, memory loss (Started to have signs) and substance abuse. The patient does not have insomnia.    Trivial leg swelling at the end of the day.  Mild joint pains off and on knees and hips.  (Headaches.  I have reviewed and (if needed) personally updated the  patient's problem list, medications, allergies, past medical and surgical history, social and family history.   PAST MEDICAL HISTORY   Past Medical History:  Diagnosis Date   A-fib [facility])    Dilated cardiomyopathy ([place]) 05/28/2021   NON-ISCHEMIC -> with restoration of sinus rhythm, EF improved from 25-30% up to 40 and 45%.  Moderate-severe TR noted   Diverticulosis 2012   With intermittent diverticulitis   Endometrial cancer ([place]) 2009   S/p vaginal hysterectomy   Family history of adverse reaction to anesthesia    mother ponv   Glaucoma    Hypothyroidism due to thyroiditis treated with radioactive iodine    Idiopathic pericardial effusion 05/16/2021   Migraine    Mitral valve regurgitation due to prolapse of cusp 05/16/2021   Echo: Moderate MR with holosystolic prolapse of the posterior MV   Multiple sclerosis ([place]) 2008   Well-controlled, currently not on medications.  Repatha subcu 3 times weekly from 2008-2021.  No further relapse.   Persistent atrial fibrillation ([place]) 05/02/2021    PAST SURGICAL HISTORY   Past Surgical History:  Procedure Laterality Date   14-day Zio Patch Monitor  05/2021   October 2022 100% A. fib rates ranging from 54 to 178 bpm.  Rare PVCs with occasional couplets.   cataracts Bilateral    COLONOSCOPY WITH PROPOFOL N/A 12/11/2015   Procedure: COLONOSCOPY WITH PROPOFOL;  Surgeon: Garlan Fair, MD;  Location: [facility] ENDOS[facility];  Service: Endoscopy;  Laterality: N/A;   RIGHT/LEFT HEART CATH AND CORONARY ANGIOGRAPHY Bilateral 06/14/2021   Procedure: RIGHT/LEFT HEART CATH AND CORONARY ANGIOGRAPHY;  Surgeon: Leonie Man, MD;  Location: [facility];  Service: Cardiovascular; Angiographic normal coronary arteries.  Cardiac output-index (3.2-2.1). RAP 13/15-12 mmHg, RVP 27/3/10 mmHg; PAP-mean 30/18/23 mmHg, PCWP mean 20 mmHg.  LVEDP-EDP 122/11 mmHg-21 mmHg   salzman nodules  12/11/2014   Had one eye then the other on 01/2015   TONSILLECTOMY      TRANSTHORACIC ECHOCARDIOGRAM  05/16/2021   Severely reduced LV function-EF of 25 to 30%.  Unable assess diastolic dysfunction due to A. fib.  Mild to moderate LA dilation.  Moderate pericardial effusion.  Moderate MR with holosystolic prolapse of the posterior MV.  Normal RVP and PAP   TRANSTHORACIC ECHOCARDIOGRAM  08/30/2021   EF 40 to 45%.  No R WMA.  Moderate pericardial effusion. Mild MR and AI.  Moderate-severe TR. => Notable improvement from previous EF of 25 to 30%.   VAGINAL HYSTERECTOMY     complete    Immunization History  Administered Date(s) Administered   Influenza Split 05/16/2009,  06/04/2010, 05/25/2011, 06/10/2012, 05/26/2013, 08/25/2015, 05/12/2017, 05/31/2020   Influenza, High Dose Seasonal PF 04/22/2016, 06/18/2018, 05/20/2019   Influenza-Unspecified 06/17/2014, 05/22/2021   PFIZER(Purple Top)SARS-COV-2 Vaccination 09/22/2019, 10/13/2019, 05/08/2020   Pneumococcal Conjugate-13 07/04/2014   Pneumococcal Polysaccharide-23 06/30/2013, 08/28/2018   Td 09/15/2003   Tdap 04/22/2016   Zoster, Live 05/25/2015, 09/11/2020, 11/17/2020    MEDICATIONS/ALLERGIES   Current Meds  Medication Sig   acetaminophen (TYLENOL) 500 MG tablet Take 500-1,000 mg by mouth every 6 (six) hours as needed for moderate pain or headache.   albuterol (VENTOLIN HFA) 108 (90 Base) MCG/ACT inhaler Inhale 2 puffs into the lungs every 6 (six) hours as needed for wheezing or shortness of breath.   apixaban (ELIQUIS) 5 MG TABS tablet Take 5 mg by mouth 2 (two) times daily.   b complex vitamins capsule Take 1 capsule by mouth daily.   Biotin 10000 MCG TABS Take 10,000 mcg by mouth daily.   Calcium Carb-Cholecalciferol (CALCIUM 500/VITAMIN D PO) Take 2 each by mouth daily. Chewables   Cholecalciferol (VITAMIN D) 50 MCG (2000 UT) tablet Take 2,000 Units by mouth daily.   COLLAGEN PO Take 1 Scoop by mouth daily.   dofetilide (TIKOSYN) 250 MCG capsule Take 1 capsule (250 mcg total) by mouth 2 (two) times  daily.   latanoprost (XALATAN) 0.005 % ophthalmic solution Place 1 drop into both eyes at bedtime.   levothyroxine (SYNTHROID) 100 MCG tablet Take 100 mcg by mouth every morning.   losartan (COZAAR) 25 MG tablet Take 1 tablet (25 mg total) by mouth daily.   magnesium oxide (MAG-OX) 400 MG tablet Take 400 mg by mouth daily.   metoprolol succinate (TOPROL XL) 25 MG 24 hr tablet Take 0.5 tablets (12.5 mg total) by mouth daily.   Misc Natural Products (GLUCOSAMINE CHOND COMPLEX/MSM PO) Take 1 tablet by mouth daily. Replenex   potassium chloride SA (KLOR-CON M20) 20 MEQ tablet Take one tablet by mouth once daily   Prenatal Vit-Fe Fumarate-FA (PRENATAL MULTIVITAMIN) TABS tablet Take 1 tablet by mouth daily.   SUMAtriptan (IMITREX) 100 MG tablet TAKE 1 TABLET BY MOUTH AS NEEDED FOR HEADACHE MAY REPEAT IN 2 HOURS IF HEADACHE PERSIST   Vitamin A 2400 MCG (8000 UT) CAPS Take 8,000 Units by mouth daily.   zinc gluconate 50 MG tablet Take 50 mg by mouth 3 (three) times a week.    Allergies  Allergen Reactions   Penicillins Rash    As a child. Has patient had a PCN reaction causing immediate rash, facial/tongue/throat swelling, SOB or lightheadedness with hypotension: No Has patient had a PCN reaction causing severe rash involving mucus membranes or skin necrosis: No Has patient had a PCN reaction that required hospitalization: No Has patient had a PCN reaction occurring within the last 10 years: No If all of the above answers are "NO", then may proceed with Cephalosporin use.    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   Patient has one child -daughter.>    Patient is retired.   Patient has a college education.   Patient is right-handed.   Patient drinks very little caffeine.   Exercise at the gym 2 to 3 days a week, walks routinely and does intermittent weights.    OBJCTIVE -PE, EKG, labs   Wt Readings from Last 3 Encounters:  03/15/22 108 lb (49 kg)  03/11/22 109 lb (49.4 kg)  02/01/22 110 lb (49.9 kg)    Physical Exam: BP (!) 100/58   Pulse 92   Ht 5' 4.5" (1.638 m)   Wt 108 lb (49 kg)   SpO2 99%   BMI 18.25 kg/m  Physical Exam Constitutional:      General: She is not in acute distress.    Appearance: Normal appearance. She is not ill-appearing.     Comments: Somewhat thin and frail appearing, but is actually wearing roomed.  No obvious distress.  HENT:     Head: Normocephalic and atraumatic.     Nose: No congestion.  Eyes:     Extraocular Movements: Extraocular movements intact.     Pupils: Pupils are equal, round, and reactive to light.  Neck:     Vascular: No carotid bruit.  Cardiovascular:     Rate and Rhythm: Normal rate and regular rhythm.     Pulses: Normal pulses.     Heart sounds: Murmur (Soft HSM heard at the right lower sternal border) heard.  Pulmonary:     Effort: Pulmonary effort is normal. No respiratory distress.     Breath sounds: Normal breath sounds. No stridor.  Abdominal:     General: Abdomen is flat. There is no distension.     Palpations: Abdomen is soft.     Tenderness: There is no abdominal tenderness. There is no guarding or rebound.  Musculoskeletal:     Cervical back: Neck supple.  Neurological:     Mental Status: She is alert.     Adult ECG Report Not checked   Recent Labs:   reviewed Lab Results  Component Value Date   CHOL 212 (H) 09/07/2021   HDL 93 09/07/2021   LDLCALC 109 (H) 09/07/2021   TRIG 54 09/07/2021    CHOLHDL 2.3 09/07/2021   Lab Results  Component Value Date   CREATININE 0.76 03/11/2022   BUN 19 03/11/2022   NA 142 03/11/2022   K 4.3 03/11/2022   CL 103 03/11/2022   CO2 28 03/11/2022      Latest Ref Rng & Units 09/19/2021    2:04 PM 05/30/2021    3:23 PM 06/08/2020    2:04 PM  CBC  WBC 3.4 - 10.8 x10E3/uL 5.6  4.9  5.6   Hemoglobin 11.1 - 15.9 g/dL 12.2  13.8  13.6   Hematocrit 34.0 - 46.6 % 35.7  41.2  39.6   Platelets 150 - 450 x10E3/uL 232  201  213     No results found for: "HGBA1C"   ================================================== I spent a total of 31 minutes with the patient spent in direct patient consultation.  Additional time spent with chart review  / charting (studies, outside notes, etc): 38 min Total Time: 69 none min  Current medicines are reviewed at length with the patient today.  (+/- concerns) N/A  Notice: This dictation was prepared with Dragon dictation along with smart phrase technology. Any transcriptional errors that result from this process are unintentional and may not  be corrected upon review.  Studies Ordered:   Orders Placed This Encounter  Procedures   ECHOCARDIOGRAM COMPLETE   No orders of the defined types were placed in this encounter.   Patient Instructions / Medication Changes & Studies & Tests Ordered   Patient Instructions  Medication Instructions:  No changes  *If you need a refill on your cardiac medications before your next appointment, please call your pharmacy*   Lab Work:  Not needed   Testing/Procedures: [address]- Dec 2023 Your physician has requested that you have an echocardiogram. Echocardiography is a painless test that uses sound waves to create images of your heart. It provides your doctor with information about the size and shape of your heart and how well your heart's chambers and valves are working. This procedure takes approximately one hour. There are no restrictions for  this procedure.    Follow-Up: At [facility], you and your health needs are our priority.  As part of our continuing mission to provide you with exceptional heart care, we have created designated Provider Care Teams.  These Care Teams include your primary Cardiologist (physician) and Advanced Practice Providers (APPs -  Physician Assistants and Nurse Practitioners) who all work together to provide you with the care you need, when you need it.     Your next appointment:   6 month(s) FEB 2024  The format for your next appointment:   In Person  Provider:   Glenetta Hew, MD         Leonie Man, MD, MS [name], M.D., M.S. Interventional Cardiologist   Pager # [phone] Phone # [phone] [address] [place], [place] [address]   Thank you for choosing [facility]!!

## 2022-03-15 NOTE — Assessment & Plan Note (Signed)
Still noted on the cardiac MRI.  Without any active symptoms, normal demeanor necessarily need to treat but will continue to follow.  We will reassess with echocardiogram in a year.

## 2022-03-15 NOTE — Patient Instructions (Signed)
Medication Instructions:  No changes  *If you need a refill on your cardiac medications before your next appointment, please call your pharmacy*   Lab Work:  Not needed   Testing/Procedures: [address]- Dec 2023 Your physician has requested that you have an echocardiogram. Echocardiography is a painless test that uses sound waves to create images of your heart. It provides your doctor with information about the size and shape of your heart and how well your heart's chambers and valves are working. This procedure takes approximately one hour. There are no restrictions for this procedure.    Follow-Up: At [facility], you and your health needs are our priority.  As part of our continuing mission to provide you with exceptional heart care, we have created designated Provider Care Teams.  These Care Teams include your primary Cardiologist (physician) and Advanced Practice Providers (APPs -  Physician Assistants and Nurse Practitioners) who all work together to provide you with the care you need, when you need it.     Your next appointment:   6 month(s) FEB 2024  The format for your next appointment:   In Person  Provider:   Glenetta Hew, MD

## 2022-03-15 NOTE — Assessment & Plan Note (Signed)
Currently rate/rhythm controlled on Tikosyn. With (proximal) persistent A-fib high risk for stroke-CHA2DS2-VASc score 4.  On Eliquis 5 mg twice daily.

## 2022-03-15 NOTE — Assessment & Plan Note (Signed)
Thankfully, this appears to have fully resolved based on the cardiac MRI.  Clearly not having any heart failure symptoms at this point. She does have persistent pericardial effusion.  Plan: Continue low-dose Toprol and losartan. Euvolemic-no diuretic.  To confirm stable EF and reassess effusion, will check a new baseline echocardiogram at the end of December

## 2022-03-15 NOTE — Assessment & Plan Note (Signed)
Has now been loaded on Tikosyn, she is doing well.  As far as she can tell may be only 1 other episode since the initial posthospital follow-up where heart rate past.  She really has been otherwise doing very well.  No adverse effects of the Tikosyn noted.  No early satiety or nausea or dyspnea.  Plan:  Continue dofetilide 250 mg twice daily along with low-dose Toprol 12.5 mg daily.  Continue apixaban 5 mg twice daily.  Continue afterload reduction with losartan.  (She has been having a little bit of a cough.  If this persist, may want to consider switching to a different ARB.)

## 2022-03-15 NOTE — Assessment & Plan Note (Signed)
Moderate MR noted on echo, not commented on MRI.  We will reassess with 2D echo

## 2022-04-08 ENCOUNTER — Other Ambulatory Visit (HOSPITAL_COMMUNITY): Payer: Self-pay | Admitting: *Deleted

## 2022-04-08 MED ORDER — DOFETILIDE 250 MCG PO CAPS: 250.0000 ug | ORAL_CAPSULE | Freq: Two times a day (BID) | ORAL | 1 refills | Status: DC

## 2022-05-16 ENCOUNTER — Ambulatory Visit (HOSPITAL_COMMUNITY): Payer: Medicare Other | Admitting: Physician Assistant

## 2022-05-20 ENCOUNTER — Ambulatory Visit (HOSPITAL_COMMUNITY)
Admission: RE | Admit: 2022-05-20 | Discharge: 2022-05-20 | Disposition: A | Discharge status: Home / Self Care | Payer: Medicare Other | Source: Ambulatory Visit | Attending: Physician Assistant | Admitting: Physician Assistant

## 2022-05-20 ENCOUNTER — Encounter (HOSPITAL_COMMUNITY): Payer: Self-pay | Admitting: Nurse Practitioner

## 2022-05-20 VITALS — BP 118/70 | HR 66 | Wt 111.0 lb

## 2022-05-20 DIAGNOSIS — Z8589 Personal history of malignant neoplasm of other organs and systems: Secondary | ICD-10-CM | POA: Diagnosis not present

## 2022-05-20 DIAGNOSIS — I5022 Chronic systolic (congestive) heart failure: Secondary | ICD-10-CM | POA: Diagnosis not present

## 2022-05-20 DIAGNOSIS — I4819 Other persistent atrial fibrillation: Secondary | ICD-10-CM | POA: Diagnosis not present

## 2022-05-20 DIAGNOSIS — G35 Multiple sclerosis: Secondary | ICD-10-CM | POA: Diagnosis not present

## 2022-05-20 DIAGNOSIS — D6869 Other thrombophilia: Secondary | ICD-10-CM | POA: Insufficient documentation

## 2022-05-20 DIAGNOSIS — I340 Nonrheumatic mitral (valve) insufficiency: Secondary | ICD-10-CM | POA: Insufficient documentation

## 2022-05-20 DIAGNOSIS — Z79899 Other long term (current) drug therapy: Secondary | ICD-10-CM | POA: Diagnosis not present

## 2022-05-20 DIAGNOSIS — Z7901 Long term (current) use of anticoagulants: Secondary | ICD-10-CM | POA: Insufficient documentation

## 2022-05-20 LAB — BASIC METABOLIC PANEL
Anion gap: 7 (ref 5–15)
BUN: 16 mg/dL (ref 8–23)
CO2: 28 mmol/L (ref 22–32)
Calcium: 9.4 mg/dL (ref 8.9–10.3)
Chloride: 105 mmol/L (ref 98–111)
Creatinine, Ser: 0.68 mg/dL (ref 0.44–1.00)
GFR, Estimated: >60 mL/min (ref >60)
Glucose, Bld: 111 mg/dL — ABNORMAL HIGH (ref 70–99)
Potassium: 3.6 mmol/L (ref 3.5–5.1)
Sodium: 140 mmol/L (ref 135–145)

## 2022-05-20 LAB — MAGNESIUM: Magnesium: 2.2 mg/dL (ref 1.7–2.4)

## 2022-05-20 NOTE — Progress Notes (Signed)
Primary Care Physician: Leeroy Cha, MD Primary Cardiologist: Dr Ellyn Hack Primary Electrophysiologist: Dr Quentin Ore Referring Physician: Dr Sharen Hint [name] is a 76 y.o. female with a history of chronic systolic CHF, moderate to severe TR, endometrial cancer, multiple sclerosis, atrial fibrillation who presents for follow up in the [facility]. She had been maintained on amiodarone but had dysregulation of her thyroid as well as abnormal PFT and amiodarone was discontinued. Patient is on Eliquis for a CHADS2VASC score of 3. She was seen by Dr Quentin Ore 09/19/21, not felt to be a good ablation candidate given chronic pericardial effusion. Dofetilide was recommended.   On follow up today, patient presents for dofetilide admission. She denies any missed doses of anticoagulation in the past 3 weeks. She is in SR today but still has frequent paroxysms of afib at home.   Pt is here to f/u Tikosyn admit, 02/01/22. She is staying in [place]. Qt stable.   F/u in afib clinic, 05/20/22. She is staying in SR but notices a few seconds to a minute of elevated HR by her apple watch although she does not run strips to correlate. She is not symptomatic with this when  she sees it. She  is compliant  with tikosyn.   Today, she denies symptoms of chest pain, shortness of breath, orthopnea, PND, lower extremity edema, dizziness, presyncope, syncope, snoring, daytime somnolence, bleeding, or neurologic sequela. The patient is tolerating medications without difficulties and is otherwise without complaint today.    Atrial Fibrillation Risk Factors:  she does not have symptoms or diagnosis of sleep apnea. she does not have a history of rheumatic fever.   she has a BMI of Body mass index is 18.76 kg/m.[name] Filed Weights   05/20/22 1427  Weight: 50.3 kg    Family History  Problem Relation Age of Onset   Stroke Mother 39       lived 12 yrs   Congestive Heart Failure  Mother 39   Fainting Father    Pneumonia Father        Thought to be aspiration pneumonia following an episode of syncope.   Migraines Father    Migraines Sister    Migraines Daughter    Migraines Granddaughter    Migraines Grandson      Atrial Fibrillation Management history:  Previous antiarrhythmic drugs: amiodarone  Previous cardioversions: none Previous ablations: none CHADS2VASC score: 3 Anticoagulation history: Eliquis   Past Medical History:  Diagnosis Date   A-fib ([place])    Dilated cardiomyopathy ([place]) 05/28/2021   NON-ISCHEMIC -> with restoration of sinus rhythm, EF improved from 25-30% up to 40 and 45%.  Moderate-severe TR noted   Diverticulosis 2012   With intermittent diverticulitis   Endometrial cancer ([place]) 2009   S/p vaginal hysterectomy   Family history of adverse reaction to anesthesia    mother ponv   Glaucoma    Hypothyroidism due to thyroiditis treated with radioactive iodine    Idiopathic pericardial effusion 05/16/2021   Migraine    Mitral valve regurgitation due to prolapse of cusp 05/16/2021   Echo: Moderate MR with holosystolic prolapse of the posterior MV   Multiple sclerosis ([place]) 2008   Well-controlled, currently not on medications.  Repatha subcu 3 times weekly from 2008-2021.  No further relapse.   Persistent atrial fibrillation ([place]) 05/02/2021   Past Surgical History:  Procedure Laterality Date   14-day Zio Patch Monitor  05/2021   October 2022 100% A. fib rates ranging  from 54 to 178 bpm.  Rare PVCs with occasional couplets.   cataracts Bilateral    COLONOSCOPY WITH PROPOFOL N/A 12/11/2015   Procedure: COLONOSCOPY WITH PROPOFOL;  Surgeon: Garlan Fair, MD;  Location: [facility];  Service: Endoscopy;  Laterality: N/A;   RIGHT/LEFT HEART CATH AND CORONARY ANGIOGRAPHY Bilateral 06/14/2021   Procedure: RIGHT/LEFT HEART CATH AND CORONARY ANGIOGRAPHY;  Surgeon: Leonie Man, MD;  Location: [facility];  Service:  Cardiovascular; Angiographic normal coronary arteries.  Cardiac output-index (3.2-2.1). RAP 13/15-12 mmHg, RVP 27/3/10 mmHg; PAP-mean 30/18/23 mmHg, PCWP mean 20 mmHg.  LVEDP-EDP 122/11 mmHg-21 mmHg   salzman nodules  12/11/2014   Had one eye then the other on 01/2015   TONSILLECTOMY     TRANSTHORACIC ECHOCARDIOGRAM  05/16/2021   Severely reduced LV function-EF of 25 to 30%.  Unable assess diastolic dysfunction due to A. fib.  Mild to moderate LA dilation.  Moderate pericardial effusion.  Moderate MR with holosystolic prolapse of the posterior MV.  Normal RVP and PAP   TRANSTHORACIC ECHOCARDIOGRAM  08/30/2021   EF 40 to 45%.  No R WMA.  Moderate pericardial effusion. Mild MR and AI.  Moderate-severe TR. => Notable improvement from previous EF of 25 to 30%.   VAGINAL HYSTERECTOMY     complete    Current Outpatient Medications  Medication Sig Dispense Refill   acetaminophen (TYLENOL) 500 MG tablet Take 500-1,000 mg by mouth every 6 (six) hours as needed for moderate pain or headache.     apixaban (ELIQUIS) 5 MG TABS tablet Take 5 mg by mouth 2 (two) times daily.     b complex vitamins capsule Take 1 capsule by mouth daily.     Biotin 10000 MCG TABS Take 10,000 mcg by mouth daily.     Calcium Carb-Cholecalciferol (CALCIUM 500/VITAMIN D PO) Take 2 each by mouth daily. Chewables     Cholecalciferol (VITAMIN D) 50 MCG (2000 UT) tablet Take 2,000 Units by mouth daily.     COLLAGEN PO Take 1 Scoop by mouth daily.     dofetilide (TIKOSYN) 250 MCG capsule Take 1 capsule (250 mcg total) by mouth 2 (two) times daily. 180 capsule 1   latanoprost (XALATAN) 0.005 % ophthalmic solution Place 1 drop into both eyes at bedtime.  6   levothyroxine (SYNTHROID) 100 MCG tablet Take 100 mcg by mouth every morning.     losartan (COZAAR) 25 MG tablet Take 1 tablet (25 mg total) by mouth daily. 30 tablet 11   magnesium oxide (MAG-OX) 400 MG tablet Take 400 mg by mouth daily.     metoprolol succinate (TOPROL XL) 25 MG  24 hr tablet Take 0.5 tablets (12.5 mg total) by mouth daily. 45 tablet 2   Misc Natural Products (GLUCOSAMINE CHOND COMPLEX/MSM PO) Take 1 tablet by mouth daily. Replenex     potassium chloride SA (KLOR-CON M20) 20 MEQ tablet Take one tablet by mouth once daily 90 tablet 1   Prenatal Vit-Fe Fumarate-FA (PRENATAL MULTIVITAMIN) TABS tablet Take 1 tablet by mouth daily.     SUMAtriptan (IMITREX) 100 MG tablet TAKE 1 TABLET BY MOUTH AS NEEDED FOR HEADACHE MAY REPEAT IN 2 HOURS IF HEADACHE PERSIST 10 tablet 3   Vitamin A 2400 MCG (8000 UT) CAPS Take 8,000 Units by mouth daily.     zinc gluconate 50 MG tablet Take 50 mg by mouth 3 (three) times a week.     No current facility-administered medications for this encounter.    Allergies  Allergen Reactions   Penicillins Rash    As a child. Has patient had a PCN reaction causing immediate rash, facial/tongue/throat swelling, SOB or lightheadedness with hypotension: No Has patient had a PCN reaction causing severe rash involving mucus membranes or skin necrosis: No Has patient had a PCN reaction that required hospitalization: No Has patient had a PCN reaction occurring within the last 10 years: No If all of the above answers are "NO", then may proceed with Cephalosporin use.    Codeine Nausea And Vomiting    Social History   Socioeconomic History   Marital status: Married    Spouse name: [name]   Number of children: 1   Years of education: 14   Highest education level: Not on file  Occupational History    Employer: [employer]  Tobacco Use   Smoking status: Never   Smokeless tobacco: Never   Tobacco comments:    Never smoke 01/22/22  Vaping Use   Vaping Use: Never used  Substance and Sexual Activity   Alcohol use: Yes    Alcohol/week: 1.0 standard drink of alcohol    Types: 1 Glasses of wine per week    Comment: wine on occ   Drug use: No   Sexual activity: Not on file  Other Topics Concern   Not on file  Social History Narrative    [name], 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   Patient has one child -daughter.>    Patient is retired.   Patient has a college education.   Patient is right-handed.   Patient drinks very little caffeine.   Exercise at the gym 2 to 3 days a week, walks routinely and does intermittent weights.   Social Determinants of Health   Financial Resource Strain: Not on file  Food Insecurity: Not on file  Transportation Needs: Not on file  Physical Activity: Not on file  Stress: Not on file  Social Connections: Not on file  Intimate Partner Violence: Not on file     ROS- All systems are reviewed and negative except as per the HPI above.  Physical Exam: Vitals:   05/20/22 1427  BP: 118/70  Pulse: 66  Weight: 50.3 kg    GEN- The patient is a well appearing female, alert and oriented x 3 today.   Head- normocephalic, atraumatic Eyes-  Sclera clear, conjunctiva pink Ears- hearing intact Oropharynx- clear Neck- supple  Lungs- Clear to ausculation bilaterally, normal work of breathing Heart- Regular rate and rhythm, no murmurs, rubs or gallops  GI- soft, NT, ND, + BS Extremities- no clubbing, cyanosis, or edema MS- no significant deformity or atrophy Skin- no rash or lesion Psych- euthymic mood, full affect Neuro- strength and sensation are intact  Wt Readings from Last 3 Encounters:  05/20/22 50.3 kg  03/15/22 49 kg  03/11/22 49.4 kg    EKG-Vent. rate 66 BPM PR interval 156 ms QRS duration 70 ms QT/QTcB 446/467 ms P-R-T axes 65 64 78 Normal sinus rhythm Cannot rule out Anterior infarct , age undetermined Abnormal ECG When compared with ECG of 01-Feb-2022 14:32, PREVIOUS ECG IS PRESENT  Echo 08/30/21 demonstrated   1. Left ventricular ejection fraction, by estimation, is 40 to 45%. The  left ventricle has mildly decreased function. The left ventricle has no  regional wall motion abnormalities. Left ventricular diastolic parameters  are indeterminate.   2.  Right ventricular systolic function is normal. The right ventricular  size is normal. There is normal pulmonary artery systolic pressure.  3. Moderate pericardial effusion. The pericardial effusion is  circumferential. There is no evidence of cardiac tamponade.   4. The mitral valve is normal in structure. Mild mitral valve  regurgitation. No evidence of mitral stenosis.   5. Tricuspid valve regurgitation is moderate to severe.   6. The aortic valve is normal in structure. Aortic valve regurgitation is  mild. No aortic stenosis is present.   7. The inferior vena cava is normal in size with greater than 50%  respiratory variability, suggesting right atrial pressure of 3 mmHg.   Comparison(s): A prior study was performed on 05/16/2021. The EF has  improved, was 25-30%, Now 40-45%. Mitral regurgitation now mild and  pericardial effusion now small.   Epic records are reviewed at length today  CHA2DS2-VASc Score = 3  The patient's score is based upon: CHF History: 1 HTN History: 0 Diabetes History: 0 Stroke History: 0 Vascular Disease History: 0 Age Score: 1 Gender Score: 1       ASSESSMENT AND PLAN: 1. Persistent Atrial Fibrillation (ICD10:  I48.19) The patient's CHA2DS2-VASc score is 3, indicating a 3.2% annual risk of stroke.   Patient  is maintaining  SR on dofetilide Stable qt. Continue Eliquis 5 mg BID Bmet/mag Continue Toprol 12.5 mg daily  2. Secondary Hypercoagulable State (ICD10:  D68.69) The patient is at significant risk for stroke/thromboembolism based upon her CHA2DS2-VASc Score of 3.  Continue Apixaban (Eliquis).   3. Chronic systolic CHF EF on cardiac MRI 57% Appears euvolemic today.  F/u with Dr. Quentin Ore as scheduled in November and with afib clinic in February   Kaula Klenke C. Jamale Spangler, [facility] [address] [place], [place] [address] [phone]

## 2022-06-12 ENCOUNTER — Other Ambulatory Visit: Payer: Self-pay | Admitting: Internal Medicine

## 2022-06-12 DIAGNOSIS — Z1231 Encounter for screening mammogram for malignant neoplasm of breast: Secondary | ICD-10-CM

## 2022-06-16 ENCOUNTER — Other Ambulatory Visit (HOSPITAL_COMMUNITY): Payer: Self-pay | Admitting: Nurse Practitioner

## 2022-06-18 ENCOUNTER — Other Ambulatory Visit (HOSPITAL_COMMUNITY): Payer: Medicare Other | Admitting: Nurse Practitioner

## 2022-06-20 ENCOUNTER — Ambulatory Visit (HOSPITAL_COMMUNITY)
Admission: RE | Admit: 2022-06-20 | Discharge: 2022-06-20 | Disposition: A | Discharge status: Home / Self Care | Payer: Medicare Other | Source: Ambulatory Visit | Attending: Nurse Practitioner | Admitting: Nurse Practitioner

## 2022-06-20 DIAGNOSIS — I4819 Other persistent atrial fibrillation: Secondary | ICD-10-CM | POA: Insufficient documentation

## 2022-06-20 LAB — BASIC METABOLIC PANEL
Anion gap: 7 (ref 5–15)
BUN: 20 mg/dL (ref 8–23)
CO2: 30 mmol/L (ref 22–32)
Calcium: 9.8 mg/dL (ref 8.9–10.3)
Chloride: 106 mmol/L (ref 98–111)
Creatinine, Ser: 0.85 mg/dL (ref 0.44–1.00)
GFR, Estimated: >60 mL/min (ref >60)
Glucose, Bld: 112 mg/dL — ABNORMAL HIGH (ref 70–99)
Potassium: 4.1 mmol/L (ref 3.5–5.1)
Sodium: 143 mmol/L (ref 135–145)

## 2022-06-25 ENCOUNTER — Ambulatory Visit: Payer: Medicare Other | Attending: Cardiology | Admitting: Cardiology

## 2022-06-25 ENCOUNTER — Encounter: Payer: Self-pay | Admitting: Cardiology

## 2022-06-25 VITALS — BP 114/62 | HR 85 | Ht 64.5 in | Wt 110.8 lb

## 2022-06-25 DIAGNOSIS — I5022 Chronic systolic (congestive) heart failure: Secondary | ICD-10-CM

## 2022-06-25 DIAGNOSIS — I4819 Other persistent atrial fibrillation: Secondary | ICD-10-CM | POA: Diagnosis not present

## 2022-06-25 DIAGNOSIS — Z79899 Other long term (current) drug therapy: Secondary | ICD-10-CM

## 2022-06-25 MED ORDER — POTASSIUM CHLORIDE CRYS ER 20 MEQ PO TBCR: 20.0000 meq | EXTENDED_RELEASE_TABLET | Freq: Two times a day (BID) | ORAL | 3 refills | Status: DC

## 2022-06-25 MED ORDER — METOPROLOL SUCCINATE ER 25 MG PO TB24: 12.5000 mg | ORAL_TABLET | Freq: Two times a day (BID) | ORAL | 3 refills | Status: DC

## 2022-06-25 NOTE — Progress Notes (Signed)
Electrophysiology Office Follow up Visit Note:    Date:  06/25/2022   ID:  [name], DOB 1946-01-12, MRN [id number]  PCP:  Leeroy Cha, MD  [facility] Cardiologist:  Glenetta Hew, MD  [facility] Electrophysiologist:  Vickie Epley, MD    Interval History:    [name] is a 76 y.o. female who presents for a follow up visit. They were last seen in clinic 05/20/2022 by Mandy Palau, NP for follow-up of dofetilide admission (01/2022). She was in sinus rhythm but reported episodes of elevated heart rates detected by her apple watch. These episodes lasted a few seconds to a minute and she was asymptomatic at those times. She was continued on Eliquis 5 mg BID and Toprol 12.5 mg daily.  Today, she reports feeling some arrhythmic episodes. However, her heart rates are not as fast as they were in the past. Her episodes have been occurring while she is at rest. Last night she received an alert from her Apple watch for a rapid heart rate while sitting and reading.  In August she had a heart rate of 174 bpm while riding during a trip out [place]. Since then she has only noticed heart rates as high as the 130's.  She denies any chest pain, shortness of breath, or peripheral edema. No lightheadedness, headaches, syncope, orthopnea, or PND.      Past Medical History:  Diagnosis Date   A-fib ([place])    Dilated cardiomyopathy ([place]) 05/28/2021   NON-ISCHEMIC -> with restoration of sinus rhythm, EF improved from 25-30% up to 40 and 45%.  Moderate-severe TR noted   Diverticulosis 2012   With intermittent diverticulitis   Endometrial cancer ([place]) 2009   S/p vaginal hysterectomy   Family history of adverse reaction to anesthesia    mother ponv   Glaucoma    Hypothyroidism due to thyroiditis treated with radioactive iodine    Idiopathic pericardial effusion 05/16/2021   Migraine    Mitral valve regurgitation due to prolapse of cusp 05/16/2021   Echo:  Moderate MR with holosystolic prolapse of the posterior MV   Multiple sclerosis ([place]) 2008   Well-controlled, currently not on medications.  Repatha subcu 3 times weekly from 2008-2021.  No further relapse.   Persistent atrial fibrillation ([place]) 05/02/2021    Past Surgical History:  Procedure Laterality Date   14-day Zio Patch Monitor  05/2021   October 2022 100% A. fib rates ranging from 54 to 178 bpm.  Rare PVCs with occasional couplets.   cataracts Bilateral    COLONOSCOPY WITH PROPOFOL N/A 12/11/2015   Procedure: COLONOSCOPY WITH PROPOFOL;  Surgeon: Garlan Fair, MD;  Location: [facility];  Service: Endoscopy;  Laterality: N/A;   RIGHT/LEFT HEART CATH AND CORONARY ANGIOGRAPHY Bilateral 06/14/2021   Procedure: RIGHT/LEFT HEART CATH AND CORONARY ANGIOGRAPHY;  Surgeon: Leonie Man, MD;  Location: [facility];  Service: Cardiovascular; Angiographic normal coronary arteries.  Cardiac output-index (3.2-2.1). RAP 13/15-12 mmHg, RVP 27/3/10 mmHg; PAP-mean 30/18/23 mmHg, PCWP mean 20 mmHg.  LVEDP-EDP 122/11 mmHg-21 mmHg   salzman nodules  12/11/2014   Had one eye then the other on 01/2015   TONSILLECTOMY     TRANSTHORACIC ECHOCARDIOGRAM  05/16/2021   Severely reduced LV function-EF of 25 to 30%.  Unable assess diastolic dysfunction due to A. fib.  Mild to moderate LA dilation.  Moderate pericardial effusion.  Moderate MR with holosystolic prolapse of the posterior MV.  Normal RVP and PAP   TRANSTHORACIC ECHOCARDIOGRAM  08/30/2021   EF 40 to 45%.  No R WMA.  Moderate pericardial effusion. Mild MR and AI.  Moderate-severe TR. => Notable improvement from previous EF of 25 to 30%.   VAGINAL HYSTERECTOMY     complete    Current Medications: Current Meds  Medication Sig   acetaminophen (TYLENOL) 500 MG tablet Take 500-1,000 mg by mouth every 6 (six) hours as needed for moderate pain or headache.   apixaban (ELIQUIS) 5 MG TABS tablet Take 5 mg by mouth 2 (two) times daily.   b  complex vitamins capsule Take 1 capsule by mouth daily.   Biotin 10000 MCG TABS Take 10,000 mcg by mouth daily.   Calcium Carb-Cholecalciferol (CALCIUM 500/VITAMIN D PO) Take 2 each by mouth daily. Chewables   Cholecalciferol (VITAMIN D) 50 MCG (2000 UT) tablet Take 2,000 Units by mouth daily.   COLLAGEN PO Take 1 Scoop by mouth daily.   dofetilide (TIKOSYN) 250 MCG capsule TAKE 1 CAPSULE BY MOUTH TWICE  DAILY   latanoprost (XALATAN) 0.005 % ophthalmic solution Place 1 drop into both eyes at bedtime.   levothyroxine (SYNTHROID) 100 MCG tablet Take 100 mcg by mouth every morning.   magnesium oxide (MAG-OX) 400 MG tablet Take 400 mg by mouth daily.   metoprolol succinate (TOPROL XL) 25 MG 24 hr tablet Take 0.5 tablets (12.5 mg total) by mouth in the morning and at bedtime.   Misc Natural Products (GLUCOSAMINE CHOND COMPLEX/MSM PO) Take 1 tablet by mouth daily. Replenex   Prenatal Vit-Fe Fumarate-FA (PRENATAL MULTIVITAMIN) TABS tablet Take 1 tablet by mouth daily.   SUMAtriptan (IMITREX) 100 MG tablet TAKE 1 TABLET BY MOUTH AS NEEDED FOR HEADACHE MAY REPEAT IN 2 HOURS IF HEADACHE PERSIST   Vitamin A 2400 MCG (8000 UT) CAPS Take 8,000 Units by mouth daily.   zinc gluconate 50 MG tablet Take 50 mg by mouth 3 (three) times a week.   [DISCONTINUED] metoprolol succinate (TOPROL XL) 25 MG 24 hr tablet Take 0.5 tablets (12.5 mg total) by mouth daily.   [DISCONTINUED] potassium chloride SA (KLOR-CON M20) 20 MEQ tablet Take one tablet by mouth once daily     Allergies:   Penicillins and Codeine   Social History   Socioeconomic History   Marital status: Married    Spouse name: Scientist, physiological   Number of children: 1   Years of education: 14   Highest education level: Not on file  Occupational History    Employer: [employer]  Tobacco Use   Smoking status: Never   Smokeless tobacco: Never   Tobacco comments:    Never smoke 01/22/22  Vaping Use   Vaping Use: Never used  Substance and Sexual Activity    Alcohol use: Yes    Alcohol/week: 1.0 standard drink of alcohol    Types: 1 Glasses of wine per week    Comment: wine on occ   Drug use: No   Sexual activity: Not on file  Other Topics Concern   Not on file  Social History Narrative   [name], 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   Patient has one child -daughter.>    Patient is retired.   Patient has a college education.   Patient is right-handed.   Patient drinks very little caffeine.   Exercise at the gym 2 to 3 days a week, walks routinely and does intermittent weights.   Social Determinants of Health   Financial Resource Strain: Not on file  Food Insecurity: Not on file  Transportation Needs: Not on file  Physical Activity: Not on file  Stress: Not on file  Social Connections: Not on file     Family History: The patient's family history includes Congestive Heart Failure (age of onset: 74) in her mother; Fainting in her father; Migraines in her daughter, father, granddaughter, grandson, and sister; Pneumonia in her father; Stroke (age of onset: 28) in her mother.  ROS:   Please see the history of present illness.     All other systems reviewed and are negative.  EKGs/Labs/Other Studies Reviewed:    The following studies were reviewed today:  Cardiac MRI  10/26/2021: IMPRESSION: 1.  Normal LV size and systolic function (EF 46%)   2.  Normal RV size and systolic function (EF 65%)   3.  No late gadolinium enhancement to suggest myocardial scar   4. Moderate pericardial effusion measuring 73m adjacent to LV inferior wall  Echo  08/30/2021:  1. Left ventricular ejection fraction, by estimation, is 40 to 45%. The  left ventricle has mildly decreased function. The left ventricle has no  regional wall motion abnormalities. Left ventricular diastolic parameters  are indeterminate.   2. Right ventricular systolic function is normal. The right ventricular  size is normal. There is normal pulmonary artery  systolic pressure.   3. Moderate pericardial effusion. The pericardial effusion is  circumferential. There is no evidence of cardiac tamponade.   4. The mitral valve is normal in structure. Mild mitral valve  regurgitation. No evidence of mitral stenosis.   5. Tricuspid valve regurgitation is moderate to severe.   6. The aortic valve is normal in structure. Aortic valve regurgitation is  mild. No aortic stenosis is present.   7. The inferior vena cava is normal in size with greater than 50%  respiratory variability, suggesting right atrial pressure of 3 mmHg.   Comparison(s): A prior study was performed on 05/16/2021. The EF has  improved, was 25-30%, Now 40-45%. Mitral regurgitation now mild and  pericardial effusion now small.   EKG:  EKG is personally reviewed.  06/25/2022: Sinus rhythm with PACs.  QTc is 461 ms.  Recent Labs: 09/07/2021: ALT 15 09/19/2021: Hemoglobin 12.2; Platelets 232 05/20/2022: Magnesium 2.2 06/20/2022: BUN 20; Creatinine, Ser 0.85; Potassium 4.1; Sodium 143   Recent Lipid Panel    Component Value Date/Time   CHOL 212 (H) 09/07/2021 0914   TRIG 54 09/07/2021 0914   HDL 93 09/07/2021 0914   CHOLHDL 2.3 09/07/2021 0914   CHOLHDL 2.5 05/25/2007 0409   VLDL 4 05/25/2007 0409   LDLCALC 109 (H) 09/07/2021 0914    Physical Exam:    VS:  BP 114/62   Pulse 85   Ht 5' 4.5" (1.638 m)   Wt 110 lb 12.8 oz (50.3 kg)   SpO2 98%   BMI 18.73 kg/m     Wt Readings from Last 3 Encounters:  06/25/22 110 lb 12.8 oz (50.3 kg)  05/20/22 111 lb (50.3 kg)  03/15/22 108 lb (49 kg)     GEN: Well nourished, well developed in no acute distress HEENT: Normal NECK: No JVD; No carotid bruits LYMPHATICS: No lymphadenopathy CARDIAC: RRR, no murmurs, rubs, gallops RESPIRATORY:  Clear to auscultation without rales, wheezing or rhonchi  ABDOMEN: Soft, non-tender, non-distended MUSCULOSKELETAL:  No edema; No deformity  SKIN: Warm and dry NEUROLOGIC:  Alert and oriented x  3 PSYCHIATRIC:  Normal affect        ASSESSMENT:    1. Chronic systolic heart failure ([place]  2. Persistent atrial fibrillation ([place])   3. Encounter for long-term (current) use of high-risk medication    PLAN:    In order of problems listed above:  #Persistent atrial fibrillation Maintaining sinus rhythm on dofetilide 250 mcg by mouth twice daily.  On Eliquis for stroke prophylaxis.  Still having some episodes of atrial ectopy that are minimally symptomatic.  I will have her increase her metoprolol succinate to 12.5 mg by mouth twice daily.  She will follow-up in 3 months with an APP.     #Chronic systolic heart failure NYHA class I-II.  Warm and dry on exam.  Rhythm control indicated as above.  Follow-up in 3 months with APP.  Lab work at that visit   Medication Adjustments/Labs and Tests Ordered: Current medicines are reviewed at length with the patient today.  Concerns regarding medicines are outlined above.   Orders Placed This Encounter  Procedures   EKG 12-Lead   Meds ordered this encounter  Medications   potassium chloride SA (KLOR-CON M20) 20 MEQ tablet    Sig: Take 1 tablet (20 mEq total) by mouth 2 (two) times daily. Take one tablet by mouth once daily    Dispense:  180 tablet    Refill:  3   metoprolol succinate (TOPROL XL) 25 MG 24 hr tablet    Sig: Take 0.5 tablets (12.5 mg total) by mouth in the morning and at bedtime.    Dispense:  90 tablet    Refill:  3   I,Mathew Stumpf,acting as a scribe for Vickie Epley, MD.,have documented all relevant documentation on the behalf of Vickie Epley, MD,as directed by  Vickie Epley, MD while in the presence of Vickie Epley, MD.  I, Vickie Epley, MD, have reviewed all documentation for this visit. The documentation on 06/25/22 for the exam, diagnosis, procedures, and orders are all accurate and complete.   Signed, Lars Mage, MD, [facility], [facility], [facility] 06/25/2022 1:37 PM    Electrophysiology [facility]

## 2022-06-25 NOTE — Patient Instructions (Signed)
Medication Instructions:  Increase Metoprolol Succinate to 12.5 mg two times a day   Refill potassium for two times a day  *If you need a refill on your cardiac medications before your next appointment, please call your pharmacy*   Lab Work: None  If you have labs (blood work) drawn today and your tests are completely normal, you will receive your results only by: [place] (if you have MyChart) OR A paper copy in the mail If you have any lab test that is abnormal or we need to change your treatment, we will call you to review the results.   Testing/Procedures: None    Follow-Up: At [facility], you and your health needs are our priority.  As part of our continuing mission to provide you with exceptional heart care, we have created designated Provider Care Teams.  These Care Teams include your primary Cardiologist (physician) and Advanced Practice Providers (APPs -  Physician Assistants and Nurse Practitioners) who all work together to provide you with the care you need, when you need it.  We recommend signing up for the patient portal called "MyChart".  Sign up information is provided on this After Visit Summary.  MyChart is used to connect with patients for Virtual Visits (Telemedicine).  Patients are able to view lab/test results, encounter notes, upcoming appointments, etc.  Non-urgent messages can be sent to your provider as well.   To learn more about what you can do with MyChart, go to [email or web address].    Your next appointment:   3 month(s)  The format for your next appointment:   In Person  Provider:   You will see one of the following Advanced Practice Providers on your designated Care Team:   Tommye Standard, [place] Legrand Como "Jonni Sanger" Chalmers Cater, [place]      Other Instructions None   Important Information About [place]

## 2022-07-23 ENCOUNTER — Ambulatory Visit (HOSPITAL_COMMUNITY): Payer: Medicare Other | Attending: Cardiology

## 2022-07-23 DIAGNOSIS — I083 Combined rheumatic disorders of mitral, aortic and tricuspid valves: Secondary | ICD-10-CM | POA: Diagnosis not present

## 2022-07-23 DIAGNOSIS — I3139 Other pericardial effusion (noninflammatory): Secondary | ICD-10-CM | POA: Diagnosis not present

## 2022-07-23 DIAGNOSIS — I4891 Unspecified atrial fibrillation: Secondary | ICD-10-CM | POA: Diagnosis not present

## 2022-07-23 DIAGNOSIS — I4819 Other persistent atrial fibrillation: Secondary | ICD-10-CM

## 2022-07-23 DIAGNOSIS — I428 Other cardiomyopathies: Secondary | ICD-10-CM | POA: Insufficient documentation

## 2022-07-23 DIAGNOSIS — I341 Nonrheumatic mitral (valve) prolapse: Secondary | ICD-10-CM

## 2022-07-23 HISTORY — PX: TRANSTHORACIC ECHOCARDIOGRAM: SHX275

## 2022-07-23 LAB — ECHOCARDIOGRAM COMPLETE
Area-P 1/2: 2.79 cm2
P 1/2 time: 420 msec
S' Lateral: 2.9 cm

## 2022-07-26 ENCOUNTER — Telehealth: Payer: Self-pay | Admitting: *Deleted

## 2022-07-26 NOTE — Telephone Encounter (Signed)
-----   Message from Leonie Man, MD sent at 07/26/2022 12:04 AM EST ----- Echocardiogram results: Overall good news.  Pump function has improved.  Ventricular function is now in the normal range with an ejection fraction of 55 to 60%.  (Previously had been 40 to 45%) The does contribute some abnormal relaxation, but features are not consistent with anything significant.  Not unexpected given the fact that the heart is recovering well.  The mitral valve has very mild prolapse of one of the leaflets but no significant regurgitation.  Only mild regurgitation-back leaking.  If we do not hear a murmur, not significant..  Aortic valve looks pretty normal.  The good news here is that the pulm function is back in the normal range.  There may be some abnormal relaxation but probably does not look that bad.  Not unexpected since the heart is still recovering. Glenetta Hew, MD

## 2022-07-26 NOTE — Telephone Encounter (Signed)
The patient has been notified of the result and verbalized understanding.  All questions (if any) were answered. Raiford Simmonds, RN 07/26/2022 5:01 PM    Patient informed RN that she is still having recurrent atrial fib   rate ranging low 100 's to 3 's.   Usually daily.  RN asked patient if she mention this information  at her last 2 visit with Afib clinic and Dr Quentin Ore .  She states she did -  " heart just has lot electrical movement "per Dr Quentin Ore stated the patient . She state Metoprolol succinate was increase to 12.5 mg twice day.  She wanted to know if there is anything else she needs to do. She states it is not as bad as it use to be.  Rn informed patient will defer to Dr Ellyn Hack

## 2022-07-29 DIAGNOSIS — H0288B Meibomian gland dysfunction left eye, upper and lower eyelids: Secondary | ICD-10-CM | POA: Diagnosis not present

## 2022-07-29 DIAGNOSIS — Z961 Presence of intraocular lens: Secondary | ICD-10-CM | POA: Diagnosis not present

## 2022-07-29 DIAGNOSIS — H0288A Meibomian gland dysfunction right eye, upper and lower eyelids: Secondary | ICD-10-CM | POA: Diagnosis not present

## 2022-07-29 DIAGNOSIS — H401231 Low-tension glaucoma, bilateral, mild stage: Secondary | ICD-10-CM | POA: Diagnosis not present

## 2022-07-29 DIAGNOSIS — H04123 Dry eye syndrome of bilateral lacrimal glands: Secondary | ICD-10-CM | POA: Diagnosis not present

## 2022-08-09 ENCOUNTER — Ambulatory Visit
Admission: RE | Admit: 2022-08-09 | Discharge: 2022-08-09 | Disposition: A | Discharge status: Home / Self Care | Payer: Medicare Other | Source: Ambulatory Visit | Attending: Internal Medicine | Admitting: Internal Medicine

## 2022-08-09 DIAGNOSIS — Z1231 Encounter for screening mammogram for malignant neoplasm of breast: Secondary | ICD-10-CM

## 2022-08-09 NOTE — Telephone Encounter (Signed)
Late entry   Patient called on 07/29/22. Patient aware Dr Ellyn Hack defer to Dr Quentin Ore. Patient verbalized understanding

## 2022-09-13 ENCOUNTER — Ambulatory Visit: Payer: Medicare Other | Admitting: Cardiology

## 2022-09-15 NOTE — [place] Notes (Deleted)
Cardiology Office Note:    Date:  09/15/2022   ID:  [name], DOB [birth year], MRN [id number]  PCP:  Leeroy Cha, MD  Cardiologist:  Glenetta Hew, MD  Electrophysiologist:  Vickie Epley, MD   Referring MD: Leeroy Cha,*   Chief Complaint: follow-up of CHF and atrial fibrillation  History of Present Illness:    [name] is a 77 y.o. female with a history of normal coronaries on cardiac catheterization in 06/2021, non-ischemic cardiomyopathy/ chronic with EF as low as 25-30% in 05/2021 but has since normalized to 55-60% on recent Echo in 07/2022, persistent atrial fibrillation on Tikosyn and Eliquis, idiopathic pericardial effusion, mitral valve prolapse with mitral regurgitation, tricuspid regurgitation, hypothyroidism secondary to thyroiditis s/p RAI treatment, and multiple sclerosis, who is followed by Dr. Ellyn Hack and Dr. Quentin Ore who presents today for routine follow-up.   Patient was referred to Dr. Ellyn Hack in 04/2021 for further evaluation of atrial fibrillation with associated dizziness. She also reported fatigue at that time. An Echo and 2 week Zio monitor were ordered for further evaluation. Echo showed LVEF of 25-30% with normal wall motion, normal RV, mild holosystolic prolapse of posterior mitral valve with moderate MR, moderate TR, and moderate pericardial effusion. Monitor showed continuous atrial fibrillation (100% burden) with rare PVCs and one 6 second run of ventricular trigeminy. Given reduced EF, a R/LHC was performed in 06/2021 and showed normal coronaries with severely reduced cardiac output index (output 3.2, index 2.1). She was started on Losartan and Amiodarone. Cardiomyopathy was felt to be tachy-mediated in nature. Repeat Echo in 08/2021 showed LVEF of 40-45% with mild MR, moderate to severe TR, and moderate pericardial effusion. Cardiac MRI in 10/2021 showed normal LV size and function (EF of 57%), normal RV size and  function (56%), no late gadolinium enhancement to suggest myocardial scar, and moderate pericardial effusion measuring 64m adjacent to LV inferior wall. Amiodarone was ultimately stopped and she was started on Tikosyn in 01/2022.   Patient was last seen by Dr. LQuentin Orein 06/2022 at which time she reported some arrhythmic episodes but heart rates were not as fast as they had been in the past. Toprol-XL was increased to 12.'5mg'$  twice daily.   Most recent Echo in 07/2022 showed LVEF of 55-60% with grade 2 diastolic dysfunction, normal RV, mild prolapse of the anterior leaflet of the mitral valve with mild MR, mild to moderate TR, and mild AI but no evidence of pericardial effusion.  Patient presents today for follow-up. ***  Non-Ischemic Cardiomyopathy Chronic Combined CHF Initially diagnosed in 05/2021 when found to have an EF of 25-30%. R/LHC at that time showed normal coronaries. Cardiomyopathy felt to be tachymediated in nature. EF has since normalized. Cardiac MRI in 2023 showed normal size and function of both LV and RV with no late gadolinium enhancement to suggest myocardial scar. Last Echo in 07/2022 showed 55-60% with grade 2 diastolic dysfunction, normal RV, mild prolapse of the anterior leaflet of the mitral valve with mild MR, mild to moderate TR, mild AI, and no evidence of pericardial effusion. - Euvolemic on exam.  - Continue Losartan '25mg'$  daily. - Continue Toprol-XL 12.'5mg'$  twice daily.   Persistent Atrial Fibrillation Now on Tiksoyn.  - Maintaining sinus rhythm. QTc  *** on EKG today.  - Continue Toprol-XL 12.'5mg'$  twice daily. - Continue Eliquis '5mg'$  twice daily.  Idiopathic Pericardial Effusion Moderate pericardial effusion was noted on Echo in 05/2021 and 08/2021 as well as the cardiac MRI in 10/2021. However, no  pericardial effusion noted on most recent Echo in 07/2022.   Mitral Valve Prolapse Mitral Regurgitation Last Echo in 07/2022 showed mild prolapse of the anterior leaflet  of the mitral valve and mild MR.  - Continue routine serial monitoring.   Tricuspid Regurgitation  Echo in 08/2021 showed moderate to severe TR but only mild to moderate on most recent Echo in 07/2022.    Past Medical History:  Diagnosis Date   A-fib ([place])    Dilated cardiomyopathy ([place]) 05/28/2021   NON-ISCHEMIC -> with restoration of sinus rhythm, EF improved from 25-30% up to 40 and 45%.  Moderate-severe TR noted   Diverticulosis 2012   With intermittent diverticulitis   Endometrial cancer ([place]) 2009   S/p vaginal hysterectomy   Family history of adverse reaction to anesthesia    mother ponv   Glaucoma    Hypothyroidism due to thyroiditis treated with radioactive iodine    Idiopathic pericardial effusion 05/16/2021   Migraine    Mitral valve regurgitation due to prolapse of cusp 05/16/2021   Echo: Moderate MR with holosystolic prolapse of the posterior MV   Multiple sclerosis ([place]) 2008   Well-controlled, currently not on medications.  Repatha subcu 3 times weekly from 2008-2021.  No further relapse.   Persistent atrial fibrillation ([place]) 05/02/2021    Past Surgical History:  Procedure Laterality Date   14-day Zio Patch Monitor  05/2021   October 2022 100% A. fib rates ranging from 54 to 178 bpm.  Rare PVCs with occasional couplets.   cataracts Bilateral    COLONOSCOPY WITH PROPOFOL N/A 12/11/2015   Procedure: COLONOSCOPY WITH PROPOFOL;  Surgeon: Garlan Fair, MD;  Location: [facility];  Service: Endoscopy;  Laterality: N/A;   RIGHT/LEFT HEART CATH AND CORONARY ANGIOGRAPHY Bilateral 06/14/2021   Procedure: RIGHT/LEFT HEART CATH AND CORONARY ANGIOGRAPHY;  Surgeon: Leonie Man, MD;  Location: [facility];  Service: Cardiovascular; Angiographic normal coronary arteries.  Cardiac output-index (3.2-2.1). RAP 13/15-12 mmHg, RVP 27/3/10 mmHg; PAP-mean 30/18/23 mmHg, PCWP mean 20 mmHg.  LVEDP-EDP 122/11 mmHg-21 mmHg   salzman nodules  12/11/2014   Had one eye  then the other on 01/2015   TONSILLECTOMY     TRANSTHORACIC ECHOCARDIOGRAM  05/16/2021   Severely reduced LV function-EF of 25 to 30%.  Unable assess diastolic dysfunction due to A. fib.  Mild to moderate LA dilation.  Moderate pericardial effusion.  Moderate MR with holosystolic prolapse of the posterior MV.  Normal RVP and PAP   TRANSTHORACIC ECHOCARDIOGRAM  08/30/2021   EF 40 to 45%.  No R WMA.  Moderate pericardial effusion. Mild MR and AI.  Moderate-severe TR. => Notable improvement from previous EF of 25 to 30%.   VAGINAL HYSTERECTOMY     complete    Current Medications: No outpatient medications have been marked as taking for the 09/24/22 encounter (Appointment) with Darreld Mclean, PA-C.     Allergies:   Penicillins and Codeine   Social History   Socioeconomic History   Marital status: Married    Spouse name: [name]   Number of children: 1   Years of education: 14   Highest education level: Not on file  Occupational History    Employer: [employer]  Tobacco Use   Smoking status: Never   Smokeless tobacco: Never   Tobacco comments:    Never smoke 01/22/22  Vaping Use   Vaping Use: Never used  Substance and Sexual Activity   Alcohol use: Yes    Alcohol/week: 1.0 standard drink  of alcohol    Types: 1 Glasses of wine per week    Comment: wine on occ   Drug use: No   Sexual activity: Not on file  Other Topics Concern   Not on file  Social History Narrative   [name], 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   Patient has one child -daughter.>    Patient is retired.   Patient has a college education.   Patient is right-handed.   Patient drinks very little caffeine.   Exercise at the gym 2 to 3 days a week, walks routinely and does intermittent weights.   Social Determinants of Health   Financial Resource Strain: Not on file  Food Insecurity: Not on file  Transportation Needs: Not on file  Physical Activity: Not on file  Stress: Not on file  Social  Connections: Not on file     Family History: The patient's family history includes Congestive Heart Failure (age of onset: 17) in her mother; Fainting in her father; Migraines in her daughter, father, granddaughter, grandson, and sister; Pneumonia in her father; Stroke (age of onset: 22) in her mother.  ROS:   Please see the history of present illness.     EKGs/Labs/Other Studies Reviewed:    The following studies were reviewed:  Echocardiogram 05/16/2021: Impressions: 1. Left ventricular ejection fraction, by estimation, is 25 to 30%. The  left ventricle has severely decreased function. The left ventricle has no  regional wall motion abnormalities. Left ventricular diastolic function  could not be evaluated.   2. Right ventricular systolic function is normal. The right ventricular  size is normal. There is normal pulmonary artery systolic pressure.   3. Left atrial size was mild to moderately dilated.   4. Moderate pericardial effusion.   5. The mitral valve is grossly normal. Moderate mitral valve  regurgitation. There is mild holosystolic prolapse of posterior of the  mitral valve.   6. Tricuspid valve regurgitation is moderate.   7. The aortic valve is normal in structure. Aortic valve regurgitation is  trivial. No aortic stenosis is present.  _______________  Monitor 05/06/2021 to 05/20/2021: Atrial fibrillation was continuously noted-100s and burden). Rate ranged from 54 to 178 bpm. No PACs (due to atrial contractions noted. Rare (<1%) isolated PVCs (future ventricular contractions) noted with quite rare couplets and one 6-second run of ventricular trigeminy (a premature beat every third beat. _______________  Right/left Cardiac Catheterization 06/14/2021:   LV end diastolic pressure is moderately elevated.   There is no aortic valve stenosis.   Angiographically normal coronary arteries   Summary: Angiographic normal Coronary Arteries-NONISCHEMIC CARDIOMYOPATHY Severely  reduced cardiac output-index: Output 3.2, index 2.1 Remains in A. Fib RHC numbers: RAP 13/15-12 mmHg, RVP 27/3/10 mmHg; PAP-mean 30/18/23 mmHg, PCWP mean 20 mmHg.  LVEDP-EDP 122/11 mmHg-21 mmHg   Plan of Care: Discharge to home after PACU Restart Eliquis tonight, continue current dose Toprol Will add 25 mg losartan Will start amiodarone 400 mg twice daily x7 days, then 20 mg twice daily x7 days then 20 mg daily. ->  We will attempt facilitated PCI after being back on [place] for 1 month. She will continue to follow-up as scheduled on the November 83  Diagnostic Dominance: Right    _______________  Echocardiogram 08/30/2021: Impressions:  1. Left ventricular ejection fraction, by estimation, is 40 to 45%. The  left ventricle has mildly decreased function. The left ventricle has no  regional wall motion abnormalities. Left ventricular diastolic parameters  are indeterminate.  2. Right ventricular systolic function is normal. The right ventricular  size is normal. There is normal pulmonary artery systolic pressure.   3. Moderate pericardial effusion. The pericardial effusion is  circumferential. There is no evidence of cardiac tamponade.   4. The mitral valve is normal in structure. Mild mitral valve  regurgitation. No evidence of mitral stenosis.   5. Tricuspid valve regurgitation is moderate to severe.   6. The aortic valve is normal in structure. Aortic valve regurgitation is  mild. No aortic stenosis is present.   7. The inferior vena cava is normal in size with greater than 50%  respiratory variability, suggesting right atrial pressure of 3 mmHg. _______________  Cardiac MRI 10/26/2021: Impression: 1.  Normal LV size and systolic function (EF 06%) 2.  Normal RV size and systolic function (EF 30%) 3.  No late gadolinium enhancement to suggest myocardial scar 4. Moderate pericardial effusion measuring 27m adjacent to LV inferior wall _______________  Echocardiogram  07/23/2022: Impressions:  1. Left ventricular ejection fraction, by estimation, is 55 to 60%. The  left ventricle has normal function. The left ventricle has no regional  wall motion abnormalities. Left ventricular diastolic parameters are  consistent with Grade II diastolic  dysfunction (pseudonormalization).   2. Right ventricular systolic function is normal. The right ventricular  size is normal. There is normal pulmonary artery systolic pressure.   3. The mitral valve is normal in structure. Mild mitral valve  regurgitation. No evidence of mitral stenosis. There is mild prolapse of  anterior leaflet of the mitral valve.   4. Tricuspid valve regurgitation is mild to moderate.   5. The aortic valve is tricuspid. Aortic valve regurgitation is mild. No  aortic stenosis is present.   6. The inferior vena cava is normal in size with greater than 50%  respiratory variability, suggesting right atrial pressure of 3 mmHg.    EKG:  EKG ordered today. EKG personally reviewed and demonstrates ***.  Recent Labs: 09/19/2021: Hemoglobin 12.2; Platelets 232 05/20/2022: Magnesium 2.2 06/20/2022: BUN 20; Creatinine, Ser 0.85; Potassium 4.1; Sodium 143  Recent Lipid Panel    Component Value Date/Time   CHOL 212 (H) 09/07/2021 0914   TRIG 54 09/07/2021 0914   HDL 93 09/07/2021 0914   CHOLHDL 2.3 09/07/2021 0914   CHOLHDL 2.5 05/25/2007 0409   VLDL 4 05/25/2007 0409   LDLCALC 109 (H) 09/07/2021 0914    Physical Exam:    Vital Signs: There were no vitals taken for this visit.    Wt Readings from Last 3 Encounters:  06/25/22 110 lb 12.8 oz (50.3 kg)  05/20/22 111 lb (50.3 kg)  03/15/22 108 lb (49 kg)     General: 77y.o. female in no acute distress. HEENT: Normocephalic and atraumatic. Sclera clear. EOMs intact. Neck: Supple. No carotid bruits. No JVD. Heart: *** RRR. Distinct S1 and S2. No murmurs, gallops, or rubs. Radial and distal pedal pulses 2+ and equal bilaterally. Lungs: No  increased work of breathing. Clear to ausculation bilaterally. No wheezes, rhonchi, or rales.  Abdomen: Soft, non-distended, and non-tender to palpation. Bowel sounds present in all 4 quadrants.  MSK: Normal strength and tone for age. *** Extremities: No lower extremity edema.    Skin: Warm and dry. Neuro: Alert and oriented x3. No focal deficits. Psych: Normal affect. Responds appropriately.   Assessment:    No diagnosis found.  Plan:     Disposition: Follow up in ***   Medication Adjustments/Labs and Tests Ordered: Current medicines are  reviewed at length with the patient today.  Concerns regarding medicines are outlined above.  No orders of the defined types were placed in this encounter.  No orders of the defined types were placed in this encounter.   There are no Patient Instructions on file for this visit.   Signed, Darreld Mclean, PA-C  09/15/2022 8:21 PM    [facility]

## 2022-09-17 ENCOUNTER — Ambulatory Visit (HOSPITAL_COMMUNITY)
Admission: RE | Admit: 2022-09-17 | Discharge: 2022-09-17 | Disposition: A | Discharge status: Home / Self Care | Payer: Medicare Other | Source: Ambulatory Visit | Attending: Physician Assistant | Admitting: Physician Assistant

## 2022-09-17 ENCOUNTER — Encounter (HOSPITAL_COMMUNITY): Payer: Self-pay | Admitting: Physician Assistant

## 2022-09-17 VITALS — BP 106/68 | HR 66 | Ht 64.5 in | Wt 110.6 lb

## 2022-09-17 DIAGNOSIS — D6869 Other thrombophilia: Secondary | ICD-10-CM | POA: Diagnosis not present

## 2022-09-17 DIAGNOSIS — I5022 Chronic systolic (congestive) heart failure: Secondary | ICD-10-CM | POA: Insufficient documentation

## 2022-09-17 DIAGNOSIS — Z7901 Long term (current) use of anticoagulants: Secondary | ICD-10-CM | POA: Diagnosis not present

## 2022-09-17 DIAGNOSIS — I4819 Other persistent atrial fibrillation: Secondary | ICD-10-CM | POA: Diagnosis present

## 2022-09-17 DIAGNOSIS — Z79899 Other long term (current) drug therapy: Secondary | ICD-10-CM | POA: Insufficient documentation

## 2022-09-17 LAB — BASIC METABOLIC PANEL
Anion gap: 16 — ABNORMAL HIGH (ref 5–15)
BUN: 17 mg/dL (ref 8–23)
CO2: 17 mmol/L — ABNORMAL LOW (ref 22–32)
Calcium: 9.2 mg/dL (ref 8.9–10.3)
Chloride: 107 mmol/L (ref 98–111)
Creatinine, Ser: 0.78 mg/dL (ref 0.44–1.00)
GFR, Estimated: >60 mL/min (ref >60)
Glucose, Bld: 81 mg/dL (ref 70–99)
Potassium: 4.3 mmol/L (ref 3.5–5.1)
Sodium: 140 mmol/L (ref 135–145)

## 2022-09-17 LAB — MAGNESIUM: Magnesium: 2.3 mg/dL (ref 1.7–2.4)

## 2022-09-17 NOTE — Progress Notes (Signed)
Primary Care Physician: Leeroy Cha, MD Primary Cardiologist: Dr Ellyn Hack Primary Electrophysiologist: Dr Quentin Ore Referring Physician: Dr Sharen Hint [name] is a 77 y.o. female with a history of chronic systolic CHF, moderate to severe TR, endometrial cancer, multiple sclerosis, atrial fibrillation who presents for follow up in the [facility]. She had been maintained on amiodarone but had dysregulation of her thyroid as well as abnormal PFT and amiodarone was discontinued. Patient is on Eliquis for a CHADS2VASC score of 3. She was seen by Dr Quentin Ore 09/19/21, not felt to be a good ablation candidate given chronic pericardial effusion. Dofetilide was recommended. Patient is s/p dofetilide admission 01/2022. She was seen on 06/25/22 by Dr Quentin Ore and her BB was increased for increased atrial ectopy.   On follow up today, patient reports that her heart racing is much less frequent and shorter after increasing the metoprolol. She denies any sustained tachypalpitations. No bleeding issues on anticoagulation.   Today, she denies symptoms of chest pain, shortness of breath, orthopnea, PND, lower extremity edema, dizziness, presyncope, syncope, snoring, daytime somnolence, bleeding, or neurologic sequela. The patient is tolerating medications without difficulties and is otherwise without complaint today.    Atrial Fibrillation Risk Factors:  she does not have symptoms or diagnosis of sleep apnea. she does not have a history of rheumatic fever.   she has a BMI of Body mass index is 18.69 kg/m.[name] Filed Weights   09/17/22 1353  Weight: 50.2 kg    Family History  Problem Relation Age of Onset   Stroke Mother 49       lived 41 yrs   Congestive Heart Failure Mother 62   Fainting Father    Pneumonia Father        Thought to be aspiration pneumonia following an episode of syncope.   Migraines Father    Migraines Sister    Migraines Daughter     Migraines Granddaughter    Migraines Grandson      Atrial Fibrillation Management history:  Previous antiarrhythmic drugs: amiodarone  Previous cardioversions: none Previous ablations: none CHADS2VASC score: 3 Anticoagulation history: Eliquis   Past Medical History:  Diagnosis Date   A-fib ([facility])    Dilated cardiomyopathy ([place]) 05/28/2021   NON-ISCHEMIC -> with restoration of sinus rhythm, EF improved from 25-30% up to 40 and 45%.  Moderate-severe TR noted   Diverticulosis 2012   With intermittent diverticulitis   Endometrial cancer ([place]) 2009   S/p vaginal hysterectomy   Family history of adverse reaction to anesthesia    mother ponv   Glaucoma    Hypothyroidism due to thyroiditis treated with radioactive iodine    Idiopathic pericardial effusion 05/16/2021   Migraine    Mitral valve regurgitation due to prolapse of cusp 05/16/2021   Echo: Moderate MR with holosystolic prolapse of the posterior MV   Multiple sclerosis ([place]) 2008   Well-controlled, currently not on medications.  Repatha subcu 3 times weekly from 2008-2021.  No further relapse.   Persistent atrial fibrillation ([place]) 05/02/2021   Past Surgical History:  Procedure Laterality Date   14-day Zio Patch Monitor  05/2021   October 2022 100% A. fib rates ranging from 54 to 178 bpm.  Rare PVCs with occasional couplets.   cataracts Bilateral    COLONOSCOPY WITH PROPOFOL N/A 12/11/2015   Procedure: COLONOSCOPY WITH PROPOFOL;  Surgeon: Garlan Fair, MD;  Location: [facility];  Service: Endoscopy;  Laterality: N/A;   RIGHT/LEFT HEART CATH AND CORONARY  ANGIOGRAPHY Bilateral 06/14/2021   Procedure: RIGHT/LEFT HEART CATH AND CORONARY ANGIOGRAPHY;  Surgeon: Leonie Man, MD;  Location: [facility];  Service: Cardiovascular; Angiographic normal coronary arteries.  Cardiac output-index (3.2-2.1). RAP 13/15-12 mmHg, RVP 27/3/10 mmHg; PAP-mean 30/18/23 mmHg, PCWP mean 20 mmHg.  LVEDP-EDP 122/11 mmHg-21 mmHg    salzman nodules  12/11/2014   Had one eye then the other on 01/2015   TONSILLECTOMY     TRANSTHORACIC ECHOCARDIOGRAM  05/16/2021   Severely reduced LV function-EF of 25 to 30%.  Unable assess diastolic dysfunction due to A. fib.  Mild to moderate LA dilation.  Moderate pericardial effusion.  Moderate MR with holosystolic prolapse of the posterior MV.  Normal RVP and PAP   TRANSTHORACIC ECHOCARDIOGRAM  08/30/2021   EF 40 to 45%.  No R WMA.  Moderate pericardial effusion. Mild MR and AI.  Moderate-severe TR. => Notable improvement from previous EF of 25 to 30%.   VAGINAL HYSTERECTOMY     complete    Current Outpatient Medications  Medication Sig Dispense Refill   acetaminophen (TYLENOL) 500 MG tablet Take 500-1,000 mg by mouth every 6 (six) hours as needed for moderate pain or headache.     apixaban (ELIQUIS) 5 MG TABS tablet Take 5 mg by mouth 2 (two) times daily.     b complex vitamins capsule Take 1 capsule by mouth daily.     Biotin 10000 MCG TABS Take 10,000 mcg by mouth daily.     Calcium Carb-Cholecalciferol (CALCIUM 500/VITAMIN D PO) Take 2 each by mouth daily. Chewables     Cholecalciferol (VITAMIN D) 50 MCG (2000 UT) tablet Take 2,000 Units by mouth daily.     dofetilide (TIKOSYN) 250 MCG capsule TAKE 1 CAPSULE BY MOUTH TWICE  DAILY 200 capsule 2   latanoprost (XALATAN) 0.005 % ophthalmic solution Place 1 drop into both eyes at bedtime.  6   levothyroxine (SYNTHROID) 100 MCG tablet Take 100 mcg by mouth every morning.     magnesium oxide (MAG-OX) 400 MG tablet Take 400 mg by mouth daily.     metoprolol succinate (TOPROL XL) 25 MG 24 hr tablet Take 0.5 tablets (12.5 mg total) by mouth in the morning and at bedtime. 90 tablet 3   Misc Natural Products (GLUCOSAMINE CHOND COMPLEX/MSM PO) Take 1 tablet by mouth daily. Replenex     potassium chloride SA (KLOR-CON M20) 20 MEQ tablet Take 1 tablet (20 mEq total) by mouth 2 (two) times daily. Take one tablet by mouth once daily 180 tablet 3    Prenatal Vit-Fe Fumarate-FA (PRENATAL MULTIVITAMIN) TABS tablet Take 1 tablet by mouth daily.     SUMAtriptan (IMITREX) 100 MG tablet TAKE 1 TABLET BY MOUTH AS NEEDED FOR HEADACHE MAY REPEAT IN 2 HOURS IF HEADACHE PERSIST 10 tablet 3   Vitamin A 2400 MCG (8000 UT) CAPS Take 8,000 Units by mouth daily.     zinc gluconate 50 MG tablet Take 50 mg by mouth 3 (three) times a week.     losartan (COZAAR) 25 MG tablet Take 1 tablet (25 mg total) by mouth daily. 30 tablet 11   No current facility-administered medications for this encounter.    Allergies  Allergen Reactions   Penicillins Rash    As a child. Has patient had a PCN reaction causing immediate rash, facial/tongue/throat swelling, SOB or lightheadedness with hypotension: No Has patient had a PCN reaction causing severe rash involving mucus membranes or skin necrosis: No Has patient had a PCN reaction  that required hospitalization: No Has patient had a PCN reaction occurring within the last 10 years: No If all of the above answers are "NO", then may proceed with Cephalosporin use.    Codeine Nausea And Vomiting    Social History   Socioeconomic History   Marital status: Married    Spouse name: [name]   Number of children: 1   Years of education: 14   Highest education level: Not on file  Occupational History    Employer: [employer]  Tobacco Use   Smoking status: Never   Smokeless tobacco: Never   Tobacco comments:    Never smoke 01/22/22  Vaping Use   Vaping Use: Never used  Substance and Sexual Activity   Alcohol use: Yes    Alcohol/week: 1.0 standard drink of alcohol    Types: 1 Glasses of wine per week    Comment: wine on occ   Drug use: No   Sexual activity: Not on file  Other Topics Concern   Not on file  Social History Narrative   [name], 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   Patient has one child -daughter.>    Patient is retired.   Patient has a college education.   Patient is right-handed.    Patient drinks very little caffeine.   Exercise at the gym 2 to 3 days a week, walks routinely and does intermittent weights.   Social Determinants of Health   Financial Resource Strain: Not on file  Food Insecurity: Not on file  Transportation Needs: Not on file  Physical Activity: Not on file  Stress: Not on file  Social Connections: Not on file  Intimate Partner Violence: Not on file     ROS- All systems are reviewed and negative except as per the HPI above.  Physical Exam: Vitals:   09/17/22 1353  BP: 106/68  Pulse: 66  Weight: 50.2 kg  Height: 5' 4.5" (1.638 m)    GEN- The patient is a well appearing elderly female, alert and oriented x 3 today.   HEENT-head normocephalic, atraumatic, sclera clear, conjunctiva pink, hearing intact, trachea midline. Lungs- Clear to ausculation bilaterally, normal work of breathing Heart- Regular rate and rhythm, no murmurs, rubs or gallops  GI- soft, NT, ND, + BS Extremities- no clubbing, cyanosis, or edema MS- no significant deformity or atrophy Skin- no rash or lesion Psych- euthymic mood, full affect Neuro- strength and sensation are intact   Wt Readings from Last 3 Encounters:  09/17/22 50.2 kg  06/25/22 50.3 kg  05/20/22 50.3 kg    EKG- SR Vent. rate 66 BPM PR interval 148 ms QRS duration 68 ms QT/QTcB 426/446 ms  Echo 08/30/21 demonstrated   1. Left ventricular ejection fraction, by estimation, is 40 to 45%. The  left ventricle has mildly decreased function. The left ventricle has no  regional wall motion abnormalities. Left ventricular diastolic parameters  are indeterminate.   2. Right ventricular systolic function is normal. The right ventricular  size is normal. There is normal pulmonary artery systolic pressure.   3. Moderate pericardial effusion. The pericardial effusion is  circumferential. There is no evidence of cardiac tamponade.   4. The mitral valve is normal in structure. Mild mitral valve   regurgitation. No evidence of mitral stenosis.   5. Tricuspid valve regurgitation is moderate to severe.   6. The aortic valve is normal in structure. Aortic valve regurgitation is  mild. No aortic stenosis is present.   7. The inferior vena cava  is normal in size with greater than 50%  respiratory variability, suggesting right atrial pressure of 3 mmHg.   Comparison(s): A prior study was performed on 05/16/2021. The EF has  improved, was 25-30%, Now 40-45%. Mitral regurgitation now mild and  pericardial effusion now small.   Epic records are reviewed at length today  CHA2DS2-VASc Score = 3  The patient's score is based upon: CHF History: 1 HTN History: 0 Diabetes History: 0 Stroke History: 0 Vascular Disease History: 0 Age Score: 1 Gender Score: 1       ASSESSMENT AND PLAN: 1. Persistent Atrial Fibrillation (ICD10:  I48.19) The patient's CHA2DS2-VASc score is 3, indicating a 3.2% annual risk of stroke.   Patient appears to be maintaining SR. Continue dofetilide 250 mcg BID. QT stable. Check bmet/mag today. Continue Eliquis 5 mg BID Continue Toprol 12.5 mg BID  2. Secondary Hypercoagulable State (ICD10:  D68.69) The patient is at significant risk for stroke/thromboembolism based upon her CHA2DS2-VASc Score of 3.  Continue Apixaban (Eliquis).   3. Chronic systolic CHF EF on cardiac MRI 57% Fluid status appears stable.    Follow up in the AF clinic in 3 months.    [facility] [address] [place], [place] [address] [phone]

## 2022-09-19 ENCOUNTER — Encounter (HOSPITAL_COMMUNITY): Payer: Self-pay | Admitting: *Deleted

## 2022-09-23 ENCOUNTER — Ambulatory Visit: Payer: Medicare Other | Admitting: Physician Assistant

## 2022-09-24 ENCOUNTER — Ambulatory Visit: Payer: Medicare Other | Admitting: Student

## 2022-11-27 IMAGING — MG DIGITAL SCREENING BILAT W/ TOMO W/ CAD
6 of 12 series · 6 of 36 positions shown · non-contrast
Comparison: Previous exam(s).

CLINICAL DATA: Screening.

EXAM:
DIGITAL SCREENING BILATERAL MAMMOGRAM WITH TOMO AND CAD

[L MLO synth-2D (1 of 2)]
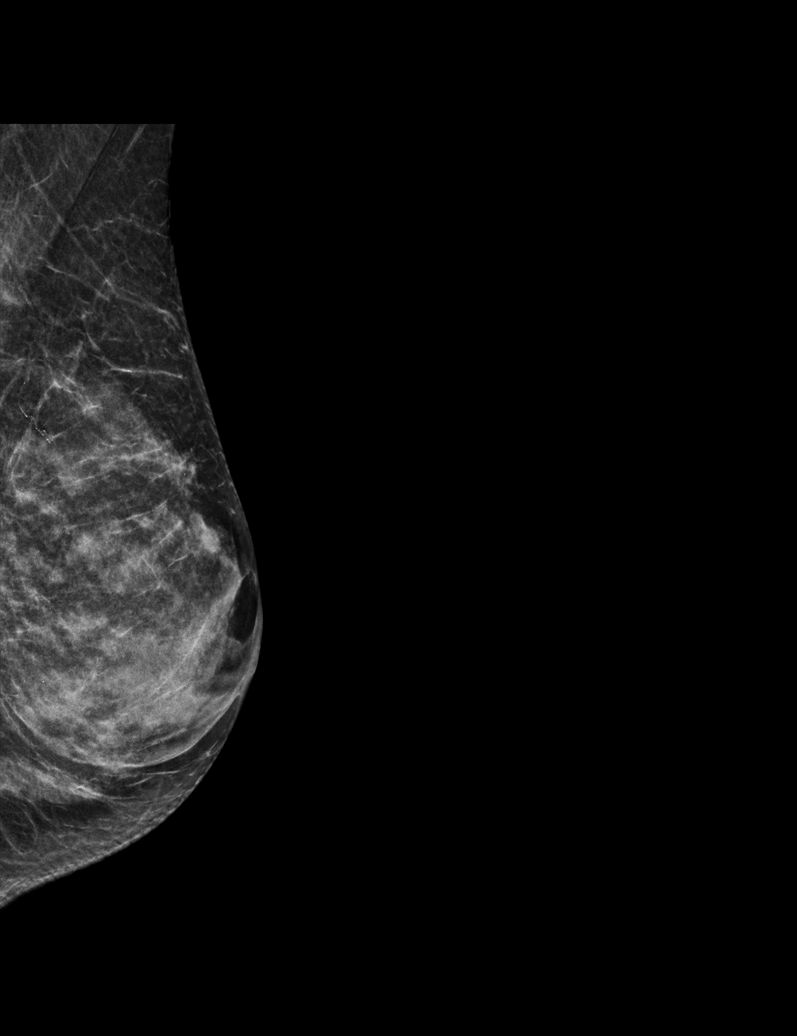

[L MLO synth-2D (2 of 2)]
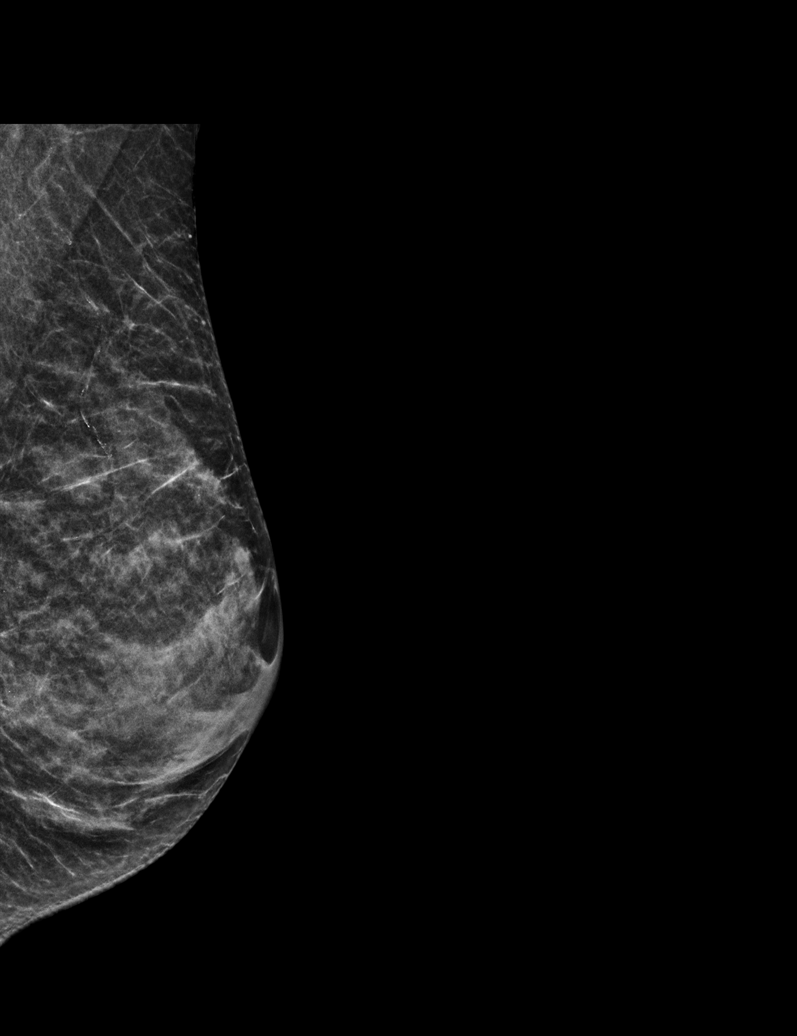

[L CC synth-2D]
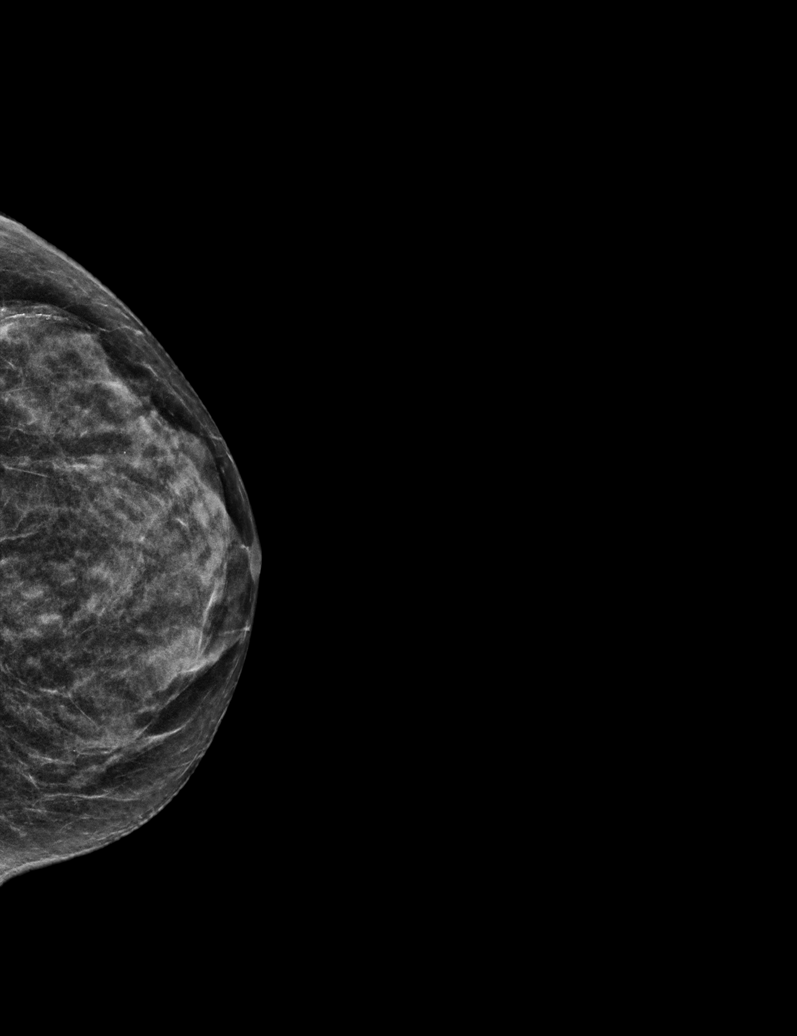

[R CC synth-2D]
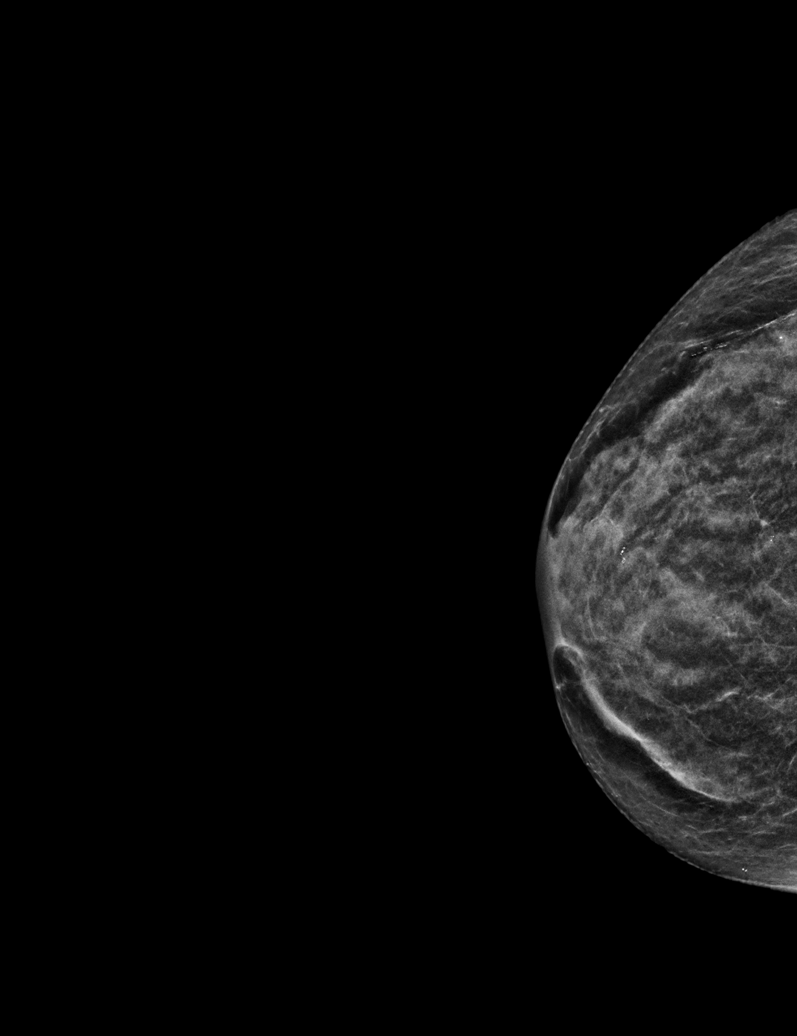

[L XCCL synth-2D]
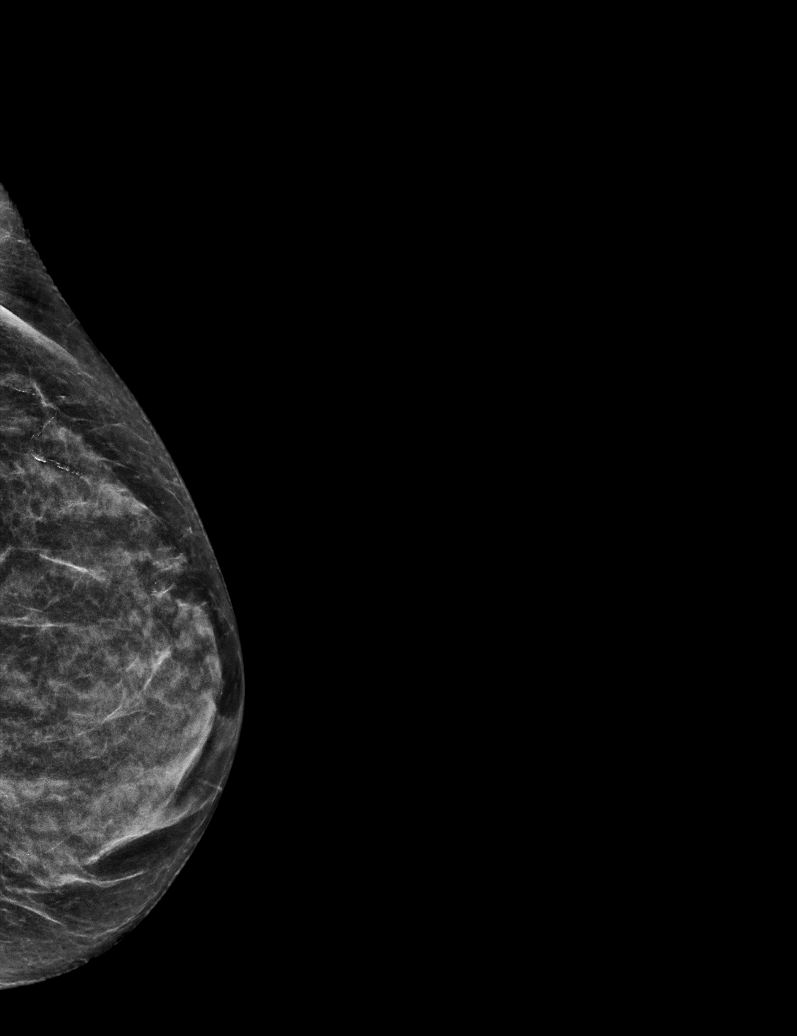

[R MLO synth-2D]
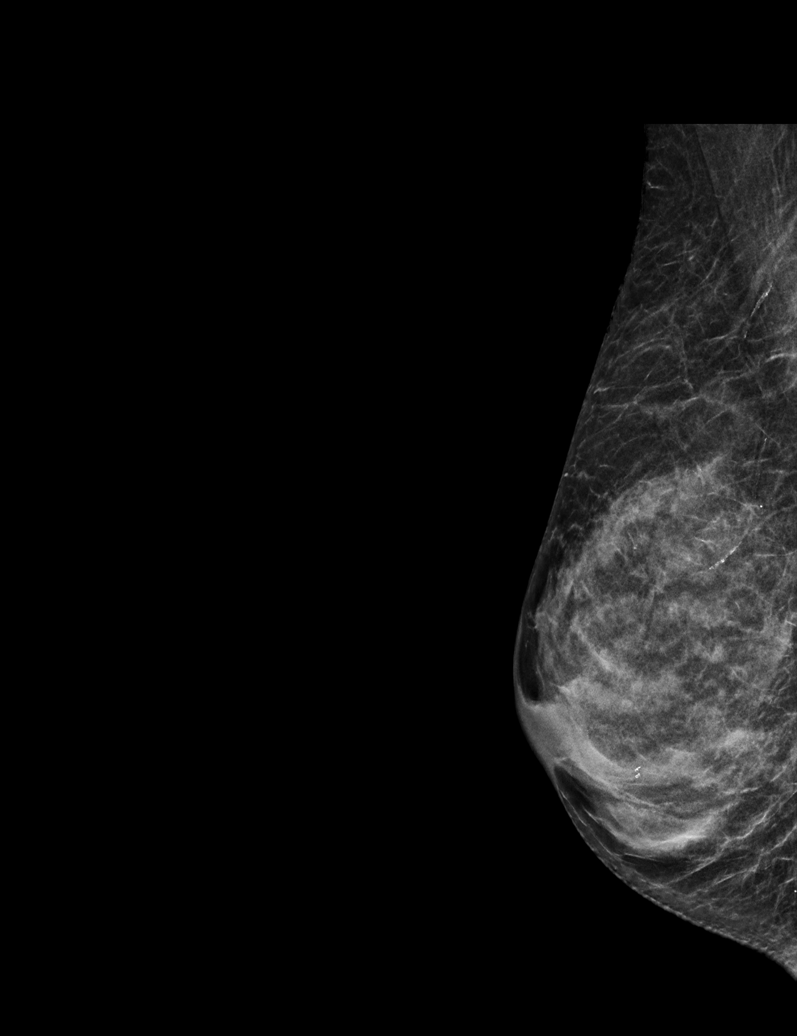

[6 of 36 positions shown; findings below may reference images not displayed]

ACR Breast Density Category c: The breast tissue is heterogeneously
dense, which may obscure small masses.
FINDINGS: There are no findings suspicious for malignancy. Images were
processed with CAD.
IMPRESSION: No mammographic evidence of malignancy. A result letter of this
screening mammogram will be mailed directly to the patient.

RECOMMENDATION:
Screening mammogram in one year. (Code:[id number])

BI-RADS CATEGORY  1: Negative.

## 2022-12-24 ENCOUNTER — Ambulatory Visit (HOSPITAL_COMMUNITY)
Admission: RE | Admit: 2022-12-24 | Discharge: 2022-12-24 | Disposition: A | Discharge status: Home / Self Care | Payer: Medicare Other | Source: Ambulatory Visit | Attending: Physician Assistant | Admitting: Physician Assistant

## 2022-12-24 VITALS — BP 126/76 | HR 72 | Ht 64.5 in | Wt 109.8 lb

## 2022-12-24 DIAGNOSIS — L659 Nonscarring hair loss, unspecified: Secondary | ICD-10-CM | POA: Insufficient documentation

## 2022-12-24 DIAGNOSIS — Z79899 Other long term (current) drug therapy: Secondary | ICD-10-CM | POA: Insufficient documentation

## 2022-12-24 DIAGNOSIS — R6889 Other general symptoms and signs: Secondary | ICD-10-CM | POA: Diagnosis not present

## 2022-12-24 DIAGNOSIS — Z5181 Encounter for therapeutic drug level monitoring: Secondary | ICD-10-CM

## 2022-12-24 DIAGNOSIS — D6869 Other thrombophilia: Secondary | ICD-10-CM | POA: Insufficient documentation

## 2022-12-24 DIAGNOSIS — Z7901 Long term (current) use of anticoagulants: Secondary | ICD-10-CM | POA: Diagnosis not present

## 2022-12-24 DIAGNOSIS — I5022 Chronic systolic (congestive) heart failure: Secondary | ICD-10-CM | POA: Insufficient documentation

## 2022-12-24 DIAGNOSIS — I4819 Other persistent atrial fibrillation: Secondary | ICD-10-CM | POA: Insufficient documentation

## 2022-12-24 LAB — CBC
HCT: 40.7 % (ref 36.0–46.0)
Hemoglobin: 13.3 g/dL (ref 12.0–15.0)
MCH: 32.4 pg (ref 26.0–34.0)
MCHC: 32.7 g/dL (ref 30.0–36.0)
MCV: 99.3 fL (ref 80.0–100.0)
Platelets: 216 10*3/uL (ref 150–400)
RBC: 4.1 MIL/uL (ref 3.87–5.11)
RDW: 13.2 % (ref 11.5–15.5)
WBC: 4.8 10*3/uL (ref 4.0–10.5)
nRBC: 0 % (ref 0.0–0.2)

## 2022-12-24 LAB — BASIC METABOLIC PANEL
Anion gap: 8 (ref 5–15)
BUN: 12 mg/dL (ref 8–23)
CO2: 27 mmol/L (ref 22–32)
Calcium: 9.5 mg/dL (ref 8.9–10.3)
Chloride: 104 mmol/L (ref 98–111)
Creatinine, Ser: 0.68 mg/dL (ref 0.44–1.00)
GFR, Estimated: >60 mL/min (ref >60)
Glucose, Bld: 92 mg/dL (ref 70–99)
Potassium: 4.4 mmol/L (ref 3.5–5.1)
Sodium: 139 mmol/L (ref 135–145)

## 2022-12-24 LAB — MAGNESIUM: Magnesium: 2.2 mg/dL (ref 1.7–2.4)

## 2022-12-24 LAB — TSH: TSH: 0.015 u[IU]/mL — ABNORMAL LOW (ref 0.350–4.500)

## 2022-12-24 NOTE — Progress Notes (Signed)
Primary Care Physician: Lorenda Ishihara, MD Primary Cardiologist: Dr Herbie Baltimore Primary Electrophysiologist: Dr Lalla Brothers Referring Physician: Dr Eli Phillips [name] is a 77 y.o. female with a history of chronic systolic CHF, moderate to severe TR, endometrial cancer, multiple sclerosis, atrial fibrillation who presents for follow up in the [facility]. She had been maintained on amiodarone but had dysregulation of her thyroid as well as abnormal PFT and amiodarone was discontinued. Patient is on Eliquis for a CHADS2VASC score of 3. She was seen by Dr Lalla Brothers 09/19/21, not felt to be a good ablation candidate given chronic pericardial effusion. Dofetilide was recommended. Patient is s/p dofetilide admission 01/2022. She was seen on 06/25/22 by Dr Lalla Brothers and her BB was increased for increased atrial ectopy.   On follow up today, patient continues to have very brief episodes of palpitations lasting only a few minutes. There does not appear to be any specific triggers. No bleeding issues on anticoagulation. She has noted hair loss and cold intolerance lately.   Today, she denies symptoms of chest pain, shortness of breath, orthopnea, PND, lower extremity edema, dizziness, presyncope, syncope, snoring, daytime somnolence, bleeding, or neurologic sequela. The patient is tolerating medications without difficulties and is otherwise without complaint today.    Atrial Fibrillation Risk Factors:  she does not have symptoms or diagnosis of sleep apnea. she does not have a history of rheumatic fever.   she has a BMI of Body mass index is 18.56 kg/m.[name] Filed Weights   12/24/22 1423  Weight: 49.8 kg   Family History  Problem Relation Age of Onset   Stroke Mother 49       lived 4 yrs   Congestive Heart Failure Mother 67   Fainting Father    Pneumonia Father        Thought to be aspiration pneumonia following an episode of syncope.   Migraines Father     Migraines Sister    Migraines Daughter    Migraines Granddaughter    Migraines Grandson      Atrial Fibrillation Management history:  Previous antiarrhythmic drugs: amiodarone, dofetilide  Previous cardioversions: none Previous ablations: none Anticoagulation history: Eliquis   Past Medical History:  Diagnosis Date   A-fib ([facility])    Dilated cardiomyopathy ([facility]) 05/28/2021   NON-ISCHEMIC -> with restoration of sinus rhythm, EF improved from 25-30% up to 40 and 45%.  Moderate-severe TR noted   Diverticulosis 2012   With intermittent diverticulitis   Endometrial cancer ([facility]) 2009   S/p vaginal hysterectomy   Family history of adverse reaction to anesthesia    mother ponv   Glaucoma    Hypothyroidism due to thyroiditis treated with radioactive iodine    Idiopathic pericardial effusion 05/16/2021   Migraine    Mitral valve regurgitation due to prolapse of cusp 05/16/2021   Echo: Moderate MR with holosystolic prolapse of the posterior MV   Multiple sclerosis ([facility]) 2008   Well-controlled, currently not on medications.  Repatha subcu 3 times weekly from 2008-2021.  No further relapse.   Persistent atrial fibrillation ([facility]) 05/02/2021   Past Surgical History:  Procedure Laterality Date   14-day Zio Patch Monitor  05/2021   October 2022 100% A. fib rates ranging from 54 to 178 bpm.  Rare PVCs with occasional couplets.   cataracts Bilateral    COLONOSCOPY WITH PROPOFOL N/A 12/11/2015   Procedure: COLONOSCOPY WITH PROPOFOL;  Surgeon: Charolett Bumpers, MD;  Location: [facility];  Service: Endoscopy;  Laterality:  N/A;   RIGHT/LEFT HEART CATH AND CORONARY ANGIOGRAPHY Bilateral 06/14/2021   Procedure: RIGHT/LEFT HEART CATH AND CORONARY ANGIOGRAPHY;  Surgeon: Marykay Lex, MD;  Location: [facility];  Service: Cardiovascular; Angiographic normal coronary arteries.  Cardiac output-index (3.2-2.1). RAP 13/15-12 mmHg, RVP 27/3/10 mmHg; PAP-mean 30/18/23 mmHg, PCWP mean 20 mmHg.   LVEDP-EDP 122/11 mmHg-21 mmHg   salzman nodules  12/11/2014   Had one eye then the other on 01/2015   TONSILLECTOMY     TRANSTHORACIC ECHOCARDIOGRAM  05/16/2021   Severely reduced LV function-EF of 25 to 30%.  Unable assess diastolic dysfunction due to A. fib.  Mild to moderate LA dilation.  Moderate pericardial effusion.  Moderate MR with holosystolic prolapse of the posterior MV.  Normal RVP and PAP   TRANSTHORACIC ECHOCARDIOGRAM  08/30/2021   EF 40 to 45%.  No R WMA.  Moderate pericardial effusion. Mild MR and AI.  Moderate-severe TR. => Notable improvement from previous EF of 25 to 30%.   VAGINAL HYSTERECTOMY     complete    Current Outpatient Medications  Medication Sig Dispense Refill   acetaminophen (TYLENOL) 500 MG tablet Take 500-1,000 mg by mouth every 6 (six) hours as needed for moderate pain or headache.     apixaban (ELIQUIS) 5 MG TABS tablet Take 5 mg by mouth 2 (two) times daily.     b complex vitamins capsule Take 1 capsule by mouth daily.     Biotin 60454 MCG TABS Take 10,000 mcg by mouth daily.     Calcium Carb-Cholecalciferol (CALCIUM 500/VITAMIN D PO) Take 2 each by mouth daily. Chewables     Cholecalciferol (VITAMIN D) 50 MCG (2000 UT) tablet Take 2,000 Units by mouth daily.     dofetilide (TIKOSYN) 250 MCG capsule TAKE 1 CAPSULE BY MOUTH TWICE  DAILY 200 capsule 2   latanoprost (XALATAN) 0.005 % ophthalmic solution Place 1 drop into both eyes at bedtime.  6   levothyroxine (SYNTHROID) 100 MCG tablet Take 100 mcg by mouth every morning.     losartan (COZAAR) 25 MG tablet Take 1 tablet (25 mg total) by mouth daily. 30 tablet 11   magnesium oxide (MAG-OX) 400 MG tablet Take 400 mg by mouth daily.     metoprolol succinate (TOPROL XL) 25 MG 24 hr tablet Take 0.5 tablets (12.5 mg total) by mouth in the morning and at bedtime. 90 tablet 3   Misc Natural Products (GLUCOSAMINE CHOND COMPLEX/MSM PO) Take 1 tablet by mouth daily. Replenex     potassium chloride SA (KLOR-CON M20)  20 MEQ tablet Take 1 tablet (20 mEq total) by mouth 2 (two) times daily. Take one tablet by mouth once daily (Patient taking differently: Take 20 mEq by mouth 2 (two) times daily.) 180 tablet 3   Prenatal Vit-Fe Fumarate-FA (PRENATAL MULTIVITAMIN) TABS tablet Take 1 tablet by mouth daily.     SUMAtriptan (IMITREX) 100 MG tablet TAKE 1 TABLET BY MOUTH AS NEEDED FOR HEADACHE MAY REPEAT IN 2 HOURS IF HEADACHE PERSIST 10 tablet 3   Vitamin A 2400 MCG (8000 UT) CAPS Take 8,000 Units by mouth 3 (three) times a week.     zinc gluconate 50 MG tablet Take 50 mg by mouth 3 (three) times a week.     No current facility-administered medications for this encounter.    Allergies  Allergen Reactions   Penicillins Rash    As a child. Has patient had a PCN reaction causing immediate rash, facial/tongue/throat swelling, SOB or lightheadedness with  hypotension: No Has patient had a PCN reaction causing severe rash involving mucus membranes or skin necrosis: No Has patient had a PCN reaction that required hospitalization: No Has patient had a PCN reaction occurring within the last 10 years: No If all of the above answers are "NO", then may proceed with Cephalosporin use.    Codeine Nausea And Vomiting    Social History   Socioeconomic History   Marital status: Married    Spouse name: August Saucer   Number of children: 1   Years of education: 14   Highest education level: Not on file  Occupational History    Employer: [employer]  Tobacco Use   Smoking status: Never   Smokeless tobacco: Never   Tobacco comments:    Never smoke 01/22/22  Vaping Use   Vaping Use: Never used  Substance and Sexual Activity   Alcohol use: Yes    Alcohol/week: 1.0 standard drink of alcohol    Types: 1 Glasses of wine per week    Comment: wine on occ   Drug use: No   Sexual activity: Not on file  Other Topics Concern   Not on file  Social History Narrative   Patient is married Development worker, international aid) and lives with his husband.   Patient  has one child -daughter.>    Patient is retired.   Patient has a college education.   Patient is right-handed.   Patient drinks very little caffeine.   Exercise at the gym 2 to 3 days a week, walks routinely and does intermittent weights.   Social Determinants of Health   Financial Resource Strain: Not on file  Food Insecurity: Not on file  Transportation Needs: Not on file  Physical Activity: Not on file  Stress: Not on file  Social Connections: Not on file  Intimate Partner Violence: Not on file     ROS- All systems are reviewed and negative except as per the HPI above.  Physical Exam: Vitals:   12/24/22 1423  BP: 126/76  Pulse: 72  Weight: 49.8 kg  Height: 5' 4.5" (1.638 m)     GEN- The patient is a well appearing female, alert and oriented x 3 today.   HEENT-head normocephalic, atraumatic, sclera clear, conjunctiva pink, hearing intact, trachea midline. Lungs- Clear to ausculation bilaterally, normal work of breathing Heart- Regular rate and rhythm, no murmurs, rubs or gallops  GI- soft, NT, ND, + BS Extremities- no clubbing, cyanosis, or edema MS- no significant deformity or atrophy Skin- no rash or lesion Psych- euthymic mood, full affect Neuro- strength and sensation are intact   Wt Readings from Last 3 Encounters:  12/24/22 49.8 kg  09/17/22 50.2 kg  06/25/22 50.3 kg    EKG today demonstrates SR Vent. rate 72 BPM PR interval 178 ms QRS duration 70 ms QT/QTcB 428/468 ms   Echo 08/30/21 demonstrated   1. Left ventricular ejection fraction, by estimation, is 40 to 45%. The  left ventricle has mildly decreased function. The left ventricle has no  regional wall motion abnormalities. Left ventricular diastolic parameters  are indeterminate.   2. Right ventricular systolic function is normal. The right ventricular  size is normal. There is normal pulmonary artery systolic pressure.   3. Moderate pericardial effusion. The pericardial effusion is   circumferential. There is no evidence of cardiac tamponade.   4. The mitral valve is normal in structure. Mild mitral valve  regurgitation. No evidence of mitral stenosis.   5. Tricuspid valve regurgitation is moderate to severe.  6. The aortic valve is normal in structure. Aortic valve regurgitation is  mild. No aortic stenosis is present.   7. The inferior vena cava is normal in size with greater than 50%  respiratory variability, suggesting right atrial pressure of 3 mmHg.   Comparison(s): A prior study was performed on 05/16/2021. The EF has  improved, was 25-30%, Now 40-45%. Mitral regurgitation now mild and  pericardial effusion now small.   Epic records are reviewed at length today  CHA2DS2-VASc Score = 3  The patient's score is based upon: CHF History: 1 HTN History: 0 Diabetes History: 0 Stroke History: 0 Vascular Disease History: 0 Age Score: 1 Gender Score: 1       ASSESSMENT AND PLAN: 1. Persistent Atrial Fibrillation (ICD10:  I48.19) The patient's CHA2DS2-VASc score is 3, indicating a 3.2% annual risk of stroke.   S/p dofetilide loading 01/2022 Patient appears to be maintaining SR with only very brief palpitations.  Continue dofetilide 250 mcg BID. QT stable. Check bmet/mag/cbc today.  Continue Eliquis 5 mg BID Continue Toprol 12.5 mg BID  2. Secondary Hypercoagulable State (ICD10:  D68.69) The patient is at significant risk for stroke/thromboembolism based upon her CHA2DS2-VASc Score of 3.  Continue Apixaban (Eliquis).   3. Chronic systolic CHF EF on cardiac MRI 57% Fluid status appears stable today.  4. Cold intolerance/hair loss Check TSH today. Follow up with PCP.   Follow up in the AF clinic in 6 months.    Jorja Loa PA-C [facility] [address] [place], [place] [address] [phone]

## 2022-12-26 ENCOUNTER — Encounter (HOSPITAL_COMMUNITY): Payer: Self-pay

## 2023-01-16 ENCOUNTER — Encounter: Payer: Self-pay | Admitting: Adult Health

## 2023-01-16 ENCOUNTER — Ambulatory Visit: Payer: Medicare Other | Admitting: Adult Health

## 2023-01-16 VITALS — BP 101/62 | HR 89 | Ht 64.0 in | Wt 109.0 lb

## 2023-01-16 DIAGNOSIS — G43909 Migraine, unspecified, not intractable, without status migrainosus: Secondary | ICD-10-CM | POA: Diagnosis not present

## 2023-01-16 DIAGNOSIS — G35 Multiple sclerosis: Secondary | ICD-10-CM

## 2023-01-16 NOTE — Patient Instructions (Signed)
Your Plan:  Continue Imitrex If your symptoms worsen or you develop new symptoms please let us know.   Thank you for coming to see us at [facility]. I hope we have been able to provide you high quality care today.  You may receive a patient satisfaction survey over the next few weeks. We would appreciate your feedback and comments so that we may continue to improve ourselves and the health of our patients.  

## 2023-01-16 NOTE — Progress Notes (Addendum)
PATIENT: [name] DOB: 12-23-1945  REASON FOR VISIT: follow up HISTORY FROM: patient Chief Complaint  Patient presents with   Follow-up    Pt in 8 Pt here for MS/Migraine f/u Pt states doing well no questions or concerns for today's visit     HISTORY OF PRESENT ILLNESS: Today 01/16/23:  [name] is a 77 y.o. female with a history of migraine headaches and multiple sclerosis. Returns today for follow-up.   Migraines: Overall migraines have been stable.  Continues to use Imitrex for abortive therapy  MS: patient has not been on disease modifying therapy in quite some time.  She denies any new symptoms.  No changes with her gait or balance.  No changes with her vision.  No changes with the bowels or bladder.  No new numbness or weakness.     01/15/22:  [name] is a 77 year old female with a history of migraine headaches and mulitple sclerosis.  She returns today for follow-up.  MS: No DMT, reports symptoms are stable.  Denies any new numbness or weakness.  Denies any changes with her gait or balance.  No changes with her vision.  No changes with the bowels or bladder.  Migraine headaches: last migraine was in [place]. Uses imitrex- it resolves the headache typically.   01/11/21: [name] is a 77 year old female with a history of migraine headaches and multiple sclerosis.  She returns today for follow-up.  The patient is no longer on any disease modifying therapy.  She states overall she has been stable.  Had repeat imaging of the brain in April that was stable.  She states that she has noticed some tingling in the left hand and on the right side of the scalp in the temporal region.  She states that this occurs on occasion but is not consistent.  She denies any changes with her gait or balance.  No changes with the bowels or bladder.  No changes with her vision.  She states that her migraines have been stable.  She states that they typically wake  her up out of sleep and this is new for her.  She states that she has not had a migraine in 2 months.  She continues to use Imitrex if needed.  HISTORY (Copied from Dr.Dohmeier's note) [name], a 77 year old retired , married female returns for followup. She was last seen 07/03/15 and has been followed for multiple sclerosis since October 2008.  She has had 2 migraines in the past month, worse during allergy season  She is  on Imitrex with good results. She does not want to be on a preventative medication . She has associated photophobia, she used to have a visual aura of  Zig zag lines and lights , now it is more of a tunnel vision arising from the periphery centrally with tiny little light dots.The headaches last with a pounding throbbing character about 3 hours up to a whole day . She is fatigued afterwards. She is highly sensitive to scents and smells.   She is currently on Rebif tolerating the medication without significant injection reactions. She does not pretreat.  She denies spasms, focal weakness, sensory changes, visual changes, speech or swallowing problems, no problems with bowel or bladder function. She has not fallen, she does not use an assistive device.    Last MRI of the brain 07/02/13 read as abnormal - MRI scan of brain showing multilple periventricular and supratentorial white matter lesions consistent with multiple sclerosis. No  enhancing lesions are noted. The presence of T 1 black holes indicates chronic disease. Overall mild increase in white matter lesion load compared to MRI 05/24/2007. She returns for reevaluation  REVIEW OF SYSTEMS: Out of a complete 14 system review of symptoms, the patient complains only of the following symptoms, and all other reviewed systems are negative.  See HPI  ALLERGIES: Allergies  Allergen Reactions   Penicillins Rash    As a child. Has patient had a PCN reaction causing immediate rash, facial/tongue/throat swelling, SOB or  lightheadedness with hypotension: No Has patient had a PCN reaction causing severe rash involving mucus membranes or skin necrosis: No Has patient had a PCN reaction that required hospitalization: No Has patient had a PCN reaction occurring within the last 10 years: No If all of the above answers are "NO", then may proceed with Cephalosporin use.    Codeine Nausea And Vomiting    HOME MEDICATIONS: Outpatient Medications Prior to Visit  Medication Sig Dispense Refill   acetaminophen (TYLENOL) 500 MG tablet Take 500-1,000 mg by mouth every 6 (six) hours as needed for moderate pain or headache.     apixaban (ELIQUIS) 5 MG TABS tablet Take 5 mg by mouth 2 (two) times daily.     b complex vitamins capsule Take 1 capsule by mouth daily.     Biotin [address] MCG TABS Take 10,000 mcg by mouth daily.     Calcium Carb-Cholecalciferol (CALCIUM 500/VITAMIN D PO) Take 2 each by mouth daily. Chewables     Cholecalciferol (VITAMIN D) 50 MCG (2000 UT) tablet Take 2,000 Units by mouth daily.     dofetilide (TIKOSYN) 250 MCG capsule TAKE 1 CAPSULE BY MOUTH TWICE  DAILY 200 capsule 2   latanoprost (XALATAN) 0.005 % ophthalmic solution Place 1 drop into both eyes at bedtime.  6   levothyroxine (SYNTHROID) 100 MCG tablet Take 100 mcg by mouth every morning.     magnesium oxide (MAG-OX) 400 MG tablet Take 400 mg by mouth daily.     metoprolol succinate (TOPROL XL) 25 MG 24 hr tablet Take 0.5 tablets (12.5 mg total) by mouth in the morning and at bedtime. 90 tablet 3   Misc Natural Products (GLUCOSAMINE CHOND COMPLEX/MSM PO) Take 1 tablet by mouth daily. Replenex     potassium chloride SA (KLOR-CON M20) 20 MEQ tablet Take 1 tablet (20 mEq total) by mouth 2 (two) times daily. Take one tablet by mouth once daily (Patient taking differently: Take 20 mEq by mouth 2 (two) times daily.) 180 tablet 3   Prenatal Vit-Fe Fumarate-FA (PRENATAL MULTIVITAMIN) TABS tablet Take 1 tablet by mouth daily.     SUMAtriptan (IMITREX) 100  MG tablet TAKE 1 TABLET BY MOUTH AS NEEDED FOR HEADACHE MAY REPEAT IN 2 HOURS IF HEADACHE PERSIST 10 tablet 3   Vitamin A 2400 MCG (8000 UT) CAPS Take 8,000 Units by mouth 3 (three) times a week.     zinc gluconate 50 MG tablet Take 50 mg by mouth 3 (three) times a week.     losartan (COZAAR) 25 MG tablet Take 1 tablet (25 mg total) by mouth daily. 30 tablet 11   No facility-administered medications prior to visit.    PAST MEDICAL HISTORY: Past Medical History:  Diagnosis Date   A-fib (HCC)    Dilated cardiomyopathy (HCC) 05/28/2021   NON-ISCHEMIC -> with restoration of sinus rhythm, EF improved from 25-30% up to 40 and 45%.  Moderate-severe TR noted   Diverticulosis 2012   With intermittent  diverticulitis   Endometrial cancer (HCC) 2009   S/p vaginal hysterectomy   Family history of adverse reaction to anesthesia    mother ponv   Glaucoma    Hypothyroidism due to thyroiditis treated with radioactive iodine    Idiopathic pericardial effusion 05/16/2021   Migraine    Mitral valve regurgitation due to prolapse of cusp 05/16/2021   Echo: Moderate MR with holosystolic prolapse of the posterior MV   Multiple sclerosis (HCC) 2008   Well-controlled, currently not on medications.  Repatha subcu 3 times weekly from 2008-2021.  No further relapse.   Persistent atrial fibrillation (HCC) 05/02/2021    PAST SURGICAL HISTORY: Past Surgical History:  Procedure Laterality Date   14-day Zio Patch Monitor  05/2021   October 2022 100% A. fib rates ranging from 54 to 178 bpm.  Rare PVCs with occasional couplets.   cataracts Bilateral    COLONOSCOPY WITH PROPOFOL N/A 12/11/2015   Procedure: COLONOSCOPY WITH PROPOFOL;  Surgeon: Charolett Bumpers, MD;  Location: [facility];  Service: Endoscopy;  Laterality: N/A;   RIGHT/LEFT HEART CATH AND CORONARY ANGIOGRAPHY Bilateral 06/14/2021   Procedure: RIGHT/LEFT HEART CATH AND CORONARY ANGIOGRAPHY;  Surgeon: Marykay Lex, MD;  Location: [facility] INVASIVE  CV LAB;  Service: Cardiovascular; Angiographic normal coronary arteries.  Cardiac output-index (3.2-2.1). RAP 13/15-12 mmHg, RVP 27/3/10 mmHg; PAP-mean 30/18/23 mmHg, PCWP mean 20 mmHg.  LVEDP-EDP 122/11 mmHg-21 mmHg   salzman nodules  12/11/2014   Had one eye then the other on 01/2015   TONSILLECTOMY     TRANSTHORACIC ECHOCARDIOGRAM  05/16/2021   Severely reduced LV function-EF of 25 to 30%.  Unable assess diastolic dysfunction due to A. fib.  Mild to moderate LA dilation.  Moderate pericardial effusion.  Moderate MR with holosystolic prolapse of the posterior MV.  Normal RVP and PAP   TRANSTHORACIC ECHOCARDIOGRAM  08/30/2021   EF 40 to 45%.  No R WMA.  Moderate pericardial effusion. Mild MR and AI.  Moderate-severe TR. => Notable improvement from previous EF of 25 to 30%.   VAGINAL HYSTERECTOMY     complete    FAMILY HISTORY: Family History  Problem Relation Age of Onset   Stroke Mother 59       lived 4 yrs   Congestive Heart Failure Mother 32   Fainting Father    Pneumonia Father        Thought to be aspiration pneumonia following an episode of syncope.   Migraines Father    Migraines Sister    Migraines Daughter    Migraines Grandson    Migraines Granddaughter    Multiple sclerosis Cousin     SOCIAL HISTORY: Social History   Socioeconomic History   Marital status: Married    Spouse name: Public house manager   Number of children: 1   Years of education: 14   Highest education level: Not on file  Occupational History    Employer: [employer]  Tobacco Use   Smoking status: Never   Smokeless tobacco: Never   Tobacco comments:    Never smoke 01/22/22  Vaping Use   Vaping Use: Never used  Substance and Sexual Activity   Alcohol use: Not on file    Comment: wine on occ   Drug use: No   Sexual activity: Not on file  Other Topics Concern   Not on file  Social History Narrative   Patient is married Development worker, international aid) and lives with his husband.   Patient has one child -daughter.>    Patient is  retired.   Patient has a college education.   Patient is right-handed.   Patient drinks very little caffeine.   Exercise at the gym 2 to 3 days a week, walks routinely and does intermittent weights.   Social Determinants of Health   Financial Resource Strain: Not on file  Food Insecurity: Not on file  Transportation Needs: Not on file  Physical Activity: Not on file  Stress: Not on file  Social Connections: Not on file  Intimate Partner Violence: Not on file      PHYSICAL EXAM  Vitals:   01/16/23 1052  BP: 101/62  Pulse: 89  Weight: 109 lb (49.4 kg)  Height: 5\' 4"  (1.626 m)   Body mass index is 18.71 kg/m.  Generalized: Well developed, in no acute distress   Neurological examination  Mentation: Alert oriented to time, place, history taking. Follows all commands speech and language fluent Cranial nerve II-XII: Pupils were equal round reactive to light. Extraocular movements were full, visual field were full on confrontational test. Facial sensation and strength were normal. Head turning and shoulder shrug  were normal and symmetric. Motor: The motor testing reveals 5 over 5 strength of all 4 extremities. Good symmetric motor tone is noted throughout.  Sensory: Sensory testing is intact to soft touch on all 4 extremities. No evidence of extinction is noted.  Coordination: Cerebellar testing reveals good finger-nose-finger and heel-to-shin bilaterally.  Gait and station: Gait is normal.  Reflexes: Deep tendon reflexes are symmetric and normal bilaterally.   DIAGNOSTIC DATA (LABS, IMAGING, TESTING) - I reviewed patient records, labs, notes, testing and imaging myself where available.  Lab Results  Component Value Date   WBC 4.8 12/24/2022   HGB 13.3 12/24/2022   HCT 40.7 12/24/2022   MCV 99.3 12/24/2022   PLT 216 12/24/2022      Component Value Date/Time   NA 139 12/24/2022 1425   NA 142 03/11/2022 1219   K 4.4 12/24/2022 1425   CL 104 12/24/2022 1425   CO2 27  12/24/2022 1425   GLUCOSE 92 12/24/2022 1425   BUN 12 12/24/2022 1425   BUN 19 03/11/2022 1219   CREATININE 0.68 12/24/2022 1425   CALCIUM 9.5 12/24/2022 1425   PROT 6.8 09/07/2021 0914   ALBUMIN 4.4 09/07/2021 0914   AST 25 09/07/2021 0914   ALT 15 09/07/2021 0914   ALKPHOS 116 09/07/2021 0914   BILITOT 0.4 09/07/2021 0914   GFRNONAA >60 12/24/2022 1425   GFRAA 93 06/08/2020 1404   Lab Results  Component Value Date   CHOL 212 (H) 09/07/2021   HDL 93 09/07/2021   LDLCALC 109 (H) 09/07/2021   TRIG 54 09/07/2021   CHOLHDL 2.3 09/07/2021      ASSESSMENT AND PLAN 77 y.o. year old female  has a past medical history of A-fib (HCC), Dilated cardiomyopathy (HCC) (05/28/2021), Diverticulosis (2012), Endometrial cancer (HCC) (2009), Family history of adverse reaction to anesthesia, Glaucoma, Hypothyroidism due to thyroiditis treated with radioactive iodine, Idiopathic pericardial effusion (05/16/2021), Migraine, Mitral valve regurgitation due to prolapse of cusp (05/16/2021), Multiple sclerosis (HCC) (2008), and Persistent atrial fibrillation (HCC) (05/02/2021). here with:  1.  Multiple sclerosis  --Continue to monitor symptoms -- May Consider repeating imaging at the next visit  2.  Migraine headaches  -- Continue Imitrex as abortive therapy -- Advised if headache frequency or severity increases she should let us know  --Follow-up in 1 year or sooner if needed  Butch Penny, MSN, NP-C 01/16/2023, 11:00 AM [facility]  [address], [address] [place], [place] [address] [phone]

## 2023-02-21 ENCOUNTER — Other Ambulatory Visit: Payer: Self-pay

## 2023-02-21 MED ORDER — LEVOTHYROXINE SODIUM 100 MCG PO TABS: 100.0000 ug | ORAL_TABLET | Freq: Every morning | ORAL | 11 refills | Status: DC

## 2023-02-21 MED ORDER — LOSARTAN POTASSIUM 25 MG PO TABS: 25.0000 mg | ORAL_TABLET | Freq: Every day | ORAL | 11 refills | Status: DC

## 2023-02-21 MED ORDER — METOPROLOL SUCCINATE ER 25 MG PO TB24: 12.5000 mg | ORAL_TABLET | Freq: Two times a day (BID) | ORAL | 11 refills | Status: DC

## 2023-02-21 MED ORDER — APIXABAN 5 MG PO TABS: 5.0000 mg | ORAL_TABLET | Freq: Two times a day (BID) | ORAL | 11 refills | Status: DC

## 2023-02-21 MED ORDER — DOFETILIDE 250 MCG PO CAPS: 250.0000 ug | ORAL_CAPSULE | Freq: Two times a day (BID) | ORAL | 11 refills | Status: DC

## 2023-03-03 ENCOUNTER — Encounter: Payer: Self-pay | Admitting: Cardiology

## 2023-03-03 NOTE — Progress Notes (Unsigned)
Cardiology Office Note:  .   Date:  03/05/2023  ID:  Mandy Skeens Schult, DOB [birth year], MRN [id number] PCP: Lorenda Ishihara, MD  [facility] Providers Cardiologist:  Bryan Lemma, MD Electrophysiologist:  Lanier Prude, MD     Chief Complaint  Patient presents with   Follow-up    Annual primary cardiology follow-up   Atrial Fibrillation    Averaging 1-3 episodes a month since she started Tikosyn     History of Present Illness: .     [name] is a very pleasant / anxious 77 y.o. female with a PMH noted below, who presents here for annual Primary Cardiologist.  Follow-up at the request of Lorenda Ishihara,*.  Notable PMH: Symptomatic Persistent A-Fib Initially on amiodarone, but TFTs and PFTs necessitated stopping.  Plan was to consider dofetilide/Tikosyn Started on Tikosyn 01/22/2022 Complicated by Nonischemic (Tachycardic) Cardiomyopathy with moderate-severe TR and moderate MR -> now resolved Idiopathic Pericardial Effusion -> now resolved  I last saw Derenda Fennel on March 15, 2022 -> she is feeling much better since being loaded with Tikosyn.  No breakthrough spells of A-fib.  Not noticing any exertional dyspnea or chest discomfort/pain.  Only a couple episodes of maybe short bursts of fast heart rates since starting the Tikosyn.  He uses her Apple Watch to monitor.  Only notes symptoms if she remains in A-fib for prolonged period time.  No further CHF symptoms.  No syncope or near syncope. => Follow-up 2D echo ordered (reviewed below.) -> She was seen by Dr. Lalla Brothers (EP) on 06/25/2022 for increased atrial ectopy.   Emmanuel Ercole Zirbel was last seen by A-fib clinic by C. Jorja Loa, [place] on 12/24/2022: Noted brief episodes of palpitations lasting a few minutes.  No triggers.  No bleeding.  Hair loss and cold intolerance noted.  Maintaining sinus rhythm on dofetilide.  Continued on low-dose Toprol 12.5 mg twice daily and  Eliquis 5 mg twice daily.  TSH ordered because of hair loss => TSH noted to be low.  Recommended discussing adjustment of thyroid medication by PCP.  She  Subjective  INTERVAL HISTORY [name] now returns for 1 year Primary Cardiology follow-up.,  Having been seen 2 months ago by Mr. Fenton in the A-fib clinic.  She seems to doing very well with Tikosyn, and not really having that much fluid in the symptoms.  She does monitor her heart rate and says that she is having brief spells in the last less than an hour of A-fib where she feels is more of a fluttering sensation now not the pounding forceful rapid beats that she had before.  These episodes are not happening that frequently and not overly worrisome to her at this point in time.  She says that she had 3 spells in May maybe 2 in June and up until last night had not had a spell in July.  Again they are not very long-lasting and she is not overly symptomatic.  She did not have any dizziness or shortness of breath or chest pain or pressure.  Otherwise she has not had any cardiac symptoms pick up.  She walks on the treadmill and is able to get her heart rate in the 110-120 bpm range but not any further. She is tolerating medications well without any difficulties.  No major complaints today.  ROS:  Cardiovascular ROS: no chest pain or dyspnea on exertion positive for - palpitations, rapid heart rate, and as noted above with runs of A-fib  negative for - edema, orthopnea, paroxysmal nocturnal dyspnea, shortness of breath, or lightheadedness with dizziness or wooziness, syncope/near syncope or TIA/amaurosis fugax, claudication; no somnolence or snoring no melena, hematochezia hematuria or epistaxis.  No bleeding issues..  Review of Systems - Negative except mild arthritis.  Symptoms but otherwise no major issues.    Objective   Studies Reviewed: [name]   EKG Interpretation Date/Time:  Tuesday March 04 2023 11:31:03 EDT Ventricular Rate:   64 PR Interval:  160 QRS Duration:  68 QT Interval:  448 QTC Calculation: 462 R Axis:   64  Text Interpretation: Normal sinus rhythm Normal ECG When compared with ECG of 24-Dec-2022 14:43, No significant change was found Confirmed by Bryan Lemma ([id number]) on 03/04/2023 12:02:57 PM   ECHO 07/23/2022: EF improved to 55-60%.  No RWMA.  GR 2 DD remains.  Normal RV with normal RVSP and RAP.  Mild MVP of anterior leaflet with mild MR.  Mild Ao valve Sclerosis but no stenosis.  No pericardial effusion (EF improved from 40 to 45% with global HK, moderate to severe TR no longer present and mild MR no longer present.  Moderate circumflex pericardial effusion normal present) Cardiac MRI 10/27/2021: Normal LV size and function-EF 57%.  Normal RV size and function-EF 56%.  No evidence of myocardial infarction.  Moderate pericardial effusion noted. R&LHC 06/14/2021: Angiographic normal coronary arteries-nonischemic cardiomyopathy.  Severely reduced CO-CI (3.2-2.1).  Persistent A-fib. RHC numbers: RAP 13/15-12 mmHg, RVP 27/3/10 mmHg; PAP-mean 30/18/23 mmHg, PCWP mean 20 mmHg.  LVEDP-EDP 122/11 mmHg-21 mmHg  Lab Results  Component Value Date   TSH 0.015 (L) 12/24/2022   Lab Results  Component Value Date   CHOL 212 (H) 09/07/2021   HDL 93 09/07/2021   LDLCALC 109 (H) 09/07/2021   TRIG 54 09/07/2021   CHOLHDL 2.3 09/07/2021   Lab Results  Component Value Date   NA 139 12/24/2022   CL 104 12/24/2022   K 4.4 12/24/2022   CO2 27 12/24/2022   BUN 12 12/24/2022   CREATININE 0.68 12/24/2022   GFRNONAA >60 12/24/2022   CALCIUM 9.5 12/24/2022   ALBUMIN 4.4 09/07/2021   GLUCOSE 92 12/24/2022   Lab Results  Component Value Date   WBC 4.8 12/24/2022   HGB 13.3 12/24/2022   HCT 40.7 12/24/2022   MCV 99.3 12/24/2022   PLT 216 12/24/2022    Risk Assessment/Calculations:    CHA2DS2-VASc Score = 4   This indicates a 4.8% annual risk of stroke. The patient's score is based upon: CHF History: 1 HTN  History: 0 Diabetes History: 0 Stroke History: 0 Vascular Disease History: 1 Age Score: 1 Gender Score: 1  CHA2DS2-VASc Score = 4 [CHF History: 1, HTN History: 0, Diabetes History: 0, Stroke History: 0, Vascular Disease History: 1, Age Score: 1, Gender Score: 1].  Therefore, the patient's annual risk of stroke is 4.8 %.               Physical Exam:   VS:  BP 104/62 (BP Location: Left Arm, Patient Position: Sitting, Cuff Size: Normal)   Pulse 64   Ht 5\' 4"  (1.626 m)   Wt 109 lb 12.8 oz (49.8 kg)   SpO2 99%   BMI 18.85 kg/m    Wt Readings from Last 3 Encounters:  03/04/23 109 lb 12.8 oz (49.8 kg)  01/16/23 109 lb (49.4 kg)  12/24/22 109 lb 12.8 oz (49.8 kg)    GEN: Well nourished, well developed in no acute distress; healthy-appearing.  Well-groomed. NECK:  No JVD; No carotid bruits CARDIAC: Normal S1, S2; RRR, no murmurs, rubs, gallops RESPIRATORY:  Clear to auscultation without rales, wheezing or rhonchi ; nonlabored, good air movement. ABDOMEN: Soft, non-tender, non-distended EXTREMITIES:  No edema; No deformity     ASSESSMENT AND PLAN: .    Problem List Items Addressed This Visit       Cardiology Problems   Persistent atrial fibrillation (HCC) -> CHA2DS2-VASc score 4 (CHF, aortic plaque, Age 39, female) (Chronic)    Nicolasa Ducking protection coverage thankfully, no longer having persistent A-fib.  Only having short episodes lasting less than 1 hour, since starting Tikosyn. With a combination Tikosyn Toprol, her rate is actually relatively well-controlled.  When she does go to A-fib, she is not actually going fast, disability the irregular heartbeat but not tachycardia. And: Continue Tikosyn 200 mcg twice daily, Toprol 12.5 mg daily along with Eliquis 5 mg twice daily.  Also continue ARB for afterload reduction to avoid left atrial pressure overload.      Relevant Orders   EKG 12-Lead (Completed)   Nonischemic cardiomyopathy (HCC) - Primary (Chronic)    Resolved with  resolution of A-fib.  Likely tachycardia mediated.  At this point we will simply monitor avoid prolonged episodes of A-fib. Continue low-dose ARB at low-dose Toprol, but no requirement for diuretic at this time.      Relevant Orders   EKG 12-Lead (Completed)   Mitral valve regurgitation due to prolapse of cusp (Chronic)   Relevant Orders   EKG 12-Lead (Completed)   Idiopathic pericardial effusion (Chronic)    Thankfully, the follow-up echocardiogram showed expected perfusion.      Hypercoagulable state due to persistent atrial fibrillation : CHA2DS2-VASc score 4 (Chronic)    Score 4.  On Eliquis 5 mg twice daily.  No bleeding issues.  Okay to hold for 2 to 3 days preop for surgeries or procedures.  (3 days for neuro or spinal procedures)      Relevant Orders   EKG 12-Lead (Completed)        Dispo: Return in about 10 months (around 01/02/2024) for 10 month follow-up with me.  Total time spent: 15 min spent with patient + 20 ()min spent charting = 35 min     Signed, Marykay Lex, MD, MS Bryan Lemma, M.D., M.S. Interventional Cardiologist  [facility]  Pager # [phone] Phone # [phone] [address] [place], [place] [address]

## 2023-03-04 ENCOUNTER — Encounter: Payer: Self-pay | Admitting: Cardiology

## 2023-03-04 ENCOUNTER — Ambulatory Visit: Payer: Medicare Other | Attending: Cardiology | Admitting: Cardiology

## 2023-03-04 VITALS — BP 104/62 | HR 64 | Ht 64.0 in | Wt 109.8 lb

## 2023-03-04 DIAGNOSIS — I3139 Other pericardial effusion (noninflammatory): Secondary | ICD-10-CM

## 2023-03-04 DIAGNOSIS — I341 Nonrheumatic mitral (valve) prolapse: Secondary | ICD-10-CM

## 2023-03-04 DIAGNOSIS — I340 Nonrheumatic mitral (valve) insufficiency: Secondary | ICD-10-CM | POA: Diagnosis not present

## 2023-03-04 DIAGNOSIS — D6869 Other thrombophilia: Secondary | ICD-10-CM | POA: Diagnosis not present

## 2023-03-04 DIAGNOSIS — I4819 Other persistent atrial fibrillation: Secondary | ICD-10-CM | POA: Diagnosis not present

## 2023-03-04 DIAGNOSIS — I428 Other cardiomyopathies: Secondary | ICD-10-CM | POA: Diagnosis not present

## 2023-03-04 NOTE — Patient Instructions (Addendum)
Medication Instructions:   If your heart is fast/fluttering - you can take extra Metoprolol and then hold the daily Losartan.   If blood pressure systolic is <100  then hold either the Metoprolol or Losartan it depends on the time a day you check your blood pressure.   *If you need a refill on your cardiac medications before your next appointment, please call your pharmacy*   Lab Work: Not needed    Testing/Procedures: Not needed   Follow-Up: At [facility], you and your health needs are our priority.  As part of our continuing mission to provide you with exceptional heart care, we have created designated Provider Care Teams.  These Care Teams include your primary Cardiologist (physician) and Advanced Practice Providers (APPs -  Physician Assistants and Nurse Practitioners) who all work together to provide you with the care you need, when you need it.     Your next appointment:   10 month(s)  The format for your next appointment:   In Person  Provider:   Bryan Lemma, MD

## 2023-03-05 ENCOUNTER — Encounter: Payer: Self-pay | Admitting: Cardiology

## 2023-03-05 NOTE — Assessment & Plan Note (Signed)
Score 4.  On Eliquis 5 mg twice daily.  No bleeding issues.  Okay to hold for 2 to 3 days preop for surgeries or procedures.  (3 days for neuro or spinal procedures)

## 2023-03-05 NOTE — Assessment & Plan Note (Signed)
Thankfully, the follow-up echocardiogram showed expected perfusion.

## 2023-03-05 NOTE — Assessment & Plan Note (Signed)
Nicolasa Ducking protection coverage thankfully, no longer having persistent A-fib.  Only having short episodes lasting less than 1 hour, since starting Tikosyn. With a combination Tikosyn Toprol, her rate is actually relatively well-controlled.  When she does go to A-fib, she is not actually going fast, disability the irregular heartbeat but not tachycardia. And: Continue Tikosyn 200 mcg twice daily, Toprol 12.5 mg daily along with Eliquis 5 mg twice daily.  Also continue ARB for afterload reduction to avoid left atrial pressure overload.

## 2023-03-05 NOTE — Assessment & Plan Note (Signed)
Resolved with resolution of A-fib.  Likely tachycardia mediated.  At this point we will simply monitor avoid prolonged episodes of A-fib. Continue low-dose ARB at low-dose Toprol, but no requirement for diuretic at this time.

## 2023-04-01 ENCOUNTER — Other Ambulatory Visit (HOSPITAL_COMMUNITY): Payer: Self-pay | Admitting: *Deleted

## 2023-04-01 MED ORDER — DOFETILIDE 250 MCG PO CAPS: 250.0000 ug | ORAL_CAPSULE | Freq: Two times a day (BID) | ORAL | 1 refills | Status: DC

## 2023-05-01 ENCOUNTER — Other Ambulatory Visit: Payer: Self-pay | Admitting: Internal Medicine

## 2023-05-01 DIAGNOSIS — M85852 Other specified disorders of bone density and structure, left thigh: Secondary | ICD-10-CM

## 2023-06-10 ENCOUNTER — Other Ambulatory Visit: Payer: Self-pay | Admitting: Cardiology

## 2023-06-25 ENCOUNTER — Ambulatory Visit (HOSPITAL_COMMUNITY)
Admission: RE | Admit: 2023-06-25 | Discharge: 2023-06-25 | Disposition: A | Discharge status: Home / Self Care | Payer: Medicare Other | Source: Ambulatory Visit | Attending: Physician Assistant | Admitting: Physician Assistant

## 2023-06-25 ENCOUNTER — Encounter (HOSPITAL_COMMUNITY): Payer: Self-pay | Admitting: Physician Assistant

## 2023-06-25 VITALS — BP 110/70 | HR 104 | Ht 64.0 in | Wt 110.8 lb

## 2023-06-25 DIAGNOSIS — I361 Nonrheumatic tricuspid (valve) insufficiency: Secondary | ICD-10-CM | POA: Insufficient documentation

## 2023-06-25 DIAGNOSIS — I4819 Other persistent atrial fibrillation: Secondary | ICD-10-CM | POA: Insufficient documentation

## 2023-06-25 DIAGNOSIS — G35 Multiple sclerosis: Secondary | ICD-10-CM | POA: Diagnosis present

## 2023-06-25 DIAGNOSIS — Z79899 Other long term (current) drug therapy: Secondary | ICD-10-CM | POA: Diagnosis not present

## 2023-06-25 DIAGNOSIS — D6869 Other thrombophilia: Secondary | ICD-10-CM | POA: Insufficient documentation

## 2023-06-25 DIAGNOSIS — I5022 Chronic systolic (congestive) heart failure: Secondary | ICD-10-CM | POA: Diagnosis present

## 2023-06-25 DIAGNOSIS — Z5181 Encounter for therapeutic drug level monitoring: Secondary | ICD-10-CM | POA: Diagnosis not present

## 2023-06-25 DIAGNOSIS — Z7901 Long term (current) use of anticoagulants: Secondary | ICD-10-CM | POA: Insufficient documentation

## 2023-06-25 DIAGNOSIS — R9431 Abnormal electrocardiogram [ECG] [EKG]: Secondary | ICD-10-CM | POA: Insufficient documentation

## 2023-06-25 LAB — CBC
HCT: 40.9 % (ref 36.0–46.0)
Hemoglobin: 13.2 g/dL (ref 12.0–15.0)
MCH: 32.8 pg (ref 26.0–34.0)
MCHC: 32.3 g/dL (ref 30.0–36.0)
MCV: 101.7 fL — ABNORMAL HIGH (ref 80.0–100.0)
Platelets: 205 10*3/uL (ref 150–400)
RBC: 4.02 MIL/uL (ref 3.87–5.11)
RDW: 13.1 % (ref 11.5–15.5)
WBC: 5.3 10*3/uL (ref 4.0–10.5)
nRBC: 0 % (ref 0.0–0.2)

## 2023-06-25 LAB — BASIC METABOLIC PANEL
Anion gap: 6 (ref 5–15)
BUN: 16 mg/dL (ref 8–23)
CO2: 27 mmol/L (ref 22–32)
Calcium: 9.6 mg/dL (ref 8.9–10.3)
Chloride: 105 mmol/L (ref 98–111)
Creatinine, Ser: 0.86 mg/dL (ref 0.44–1.00)
GFR, Estimated: >60 mL/min (ref >60)
Glucose, Bld: 114 mg/dL — ABNORMAL HIGH (ref 70–99)
Potassium: 4.1 mmol/L (ref 3.5–5.1)
Sodium: 138 mmol/L (ref 135–145)

## 2023-06-25 LAB — MAGNESIUM: Magnesium: 2.3 mg/dL (ref 1.7–2.4)

## 2023-06-25 NOTE — Progress Notes (Signed)
Primary Care Physician: Lorenda Ishihara, MD Primary Cardiologist: Dr Herbie Baltimore Primary Electrophysiologist: Dr Lalla Brothers Referring Physician: Dr Eli Phillips [name] is a 77 y.o. female with a history of chronic systolic CHF, moderate to severe TR, endometrial cancer, multiple sclerosis, atrial fibrillation who presents for follow up in the [facility]. She had been maintained on amiodarone but had dysregulation of her thyroid as well as abnormal PFT and amiodarone was discontinued. Patient is on Eliquis for a CHADS2VASC score of 3. She was seen by Dr Lalla Brothers 09/19/21, not felt to be a good ablation candidate given chronic pericardial effusion. Dofetilide was recommended. Patient is s/p dofetilide admission 01/2022. She was seen on 06/25/22 by Dr Lalla Brothers and her BB was increased for increased atrial ectopy.   On follow up today, patient reports that she feels well. She was surprised to hear she was in afib. Typically, she has tachypalpitations and her watch also alerts her. There were no specific triggers that she could identify. No bleeding issues on anticoagulation.   Today, she denies symptoms of palpitations, chest pain, shortness of breath, orthopnea, PND, lower extremity edema, dizziness, presyncope, syncope, snoring, daytime somnolence, bleeding, or neurologic sequela. The patient is tolerating medications without difficulties and is otherwise without complaint today.    Atrial Fibrillation Risk Factors:  she does not have symptoms or diagnosis of sleep apnea. she does not have a history of rheumatic fever.   Atrial Fibrillation Management history:  Previous antiarrhythmic drugs: amiodarone, dofetilide  Previous cardioversions: none Previous ablations: none Anticoagulation history: Eliquis   Past Medical History:  Diagnosis Date   A-fib (HCC)    Dilated cardiomyopathy (HCC) 05/28/2021   NON-ISCHEMIC -> with restoration of sinus  rhythm, EF improved from 25-30% up to 40 and 45%.  Moderate-severe TR noted   Diverticulosis 2012   With intermittent diverticulitis   Endometrial cancer (HCC) 2009   S/p vaginal hysterectomy   Family history of adverse reaction to anesthesia    mother ponv   Glaucoma    Hypothyroidism due to thyroiditis treated with radioactive iodine    Idiopathic pericardial effusion 05/16/2021   Migraine    Mitral valve regurgitation due to prolapse of cusp 05/16/2021   Echo: Moderate MR with holosystolic prolapse of the posterior MV   Multiple sclerosis (HCC) 2008   Well-controlled, currently not on medications.  Repatha subcu 3 times weekly from 2008-2021.  No further relapse.   Persistent atrial fibrillation (HCC) 05/02/2021    Current Outpatient Medications  Medication Sig Dispense Refill   acetaminophen (TYLENOL) 500 MG tablet Take 500-1,000 mg by mouth every 6 (six) hours as needed for moderate pain or headache.     apixaban (ELIQUIS) 5 MG TABS tablet Take 1 tablet (5 mg total) by mouth 2 (two) times daily. 60 tablet 11   b complex vitamins capsule Take 1 capsule by mouth daily.     Biotin 82956 MCG TABS Take 10,000 mcg by mouth daily.     Calcium Carb-Cholecalciferol (CALCIUM 500/VITAMIN D PO) Take 2 each by mouth daily. Chewables     Cholecalciferol (VITAMIN D) 50 MCG (2000 UT) tablet Take 2,000 Units by mouth daily.     dofetilide (TIKOSYN) 250 MCG capsule Take 1 capsule (250 mcg total) by mouth 2 (two) times daily. 180 capsule 1   latanoprost (XALATAN) 0.005 % ophthalmic solution Place 1 drop into both eyes at bedtime.  6   levothyroxine (SYNTHROID) 100 MCG tablet Take 1 tablet (100 mcg  total) by mouth every morning. 30 tablet 11   losartan (COZAAR) 25 MG tablet Take 1 tablet (25 mg total) by mouth daily. 30 tablet 11   magnesium oxide (MAG-OX) 400 MG tablet Take 400 mg by mouth daily.     metoprolol succinate (TOPROL XL) 25 MG 24 hr tablet Take 0.5 tablets (12.5 mg total) by mouth in the  morning and at bedtime. 30 tablet 11   Misc Natural Products (GLUCOSAMINE CHOND COMPLEX/MSM PO) Take 1 tablet by mouth daily. Replenex     potassium chloride SA (KLOR-CON M20) 20 MEQ tablet TAKE 1 TABLET (20 MEQ TOTAL) BY MOUTH 2 (TWO) TIMES DAILY. TAKE ONE TABLET BY MOUTH ONCE DAILY 180 tablet 2   Prenatal Vit-Fe Fumarate-FA (PRENATAL MULTIVITAMIN) TABS tablet Take 1 tablet by mouth daily.     SUMAtriptan (IMITREX) 100 MG tablet TAKE 1 TABLET BY MOUTH AS NEEDED FOR HEADACHE MAY REPEAT IN 2 HOURS IF HEADACHE PERSIST 10 tablet 3   Vitamin A 2400 MCG (8000 UT) CAPS Take 8,000 Units by mouth 3 (three) times a week.     zinc gluconate 50 MG tablet Take 50 mg by mouth 3 (three) times a week.     No current facility-administered medications for this encounter.    ROS- All systems are reviewed and negative except as per the HPI above.  Physical Exam: Vitals:   06/25/23 1352  BP: 110/70  Pulse: (!) 104  Weight: 50.3 kg  Height: 5\' 4"  (1.626 m)     GEN: Well nourished, well developed in no acute distress NECK: No JVD; No carotid bruits CARDIAC: Irregularly irregular rate and rhythm, no murmurs, rubs, gallops RESPIRATORY:  Clear to auscultation without rales, wheezing or rhonchi  ABDOMEN: Soft, non-tender, non-distended EXTREMITIES:  No edema; No deformity    Wt Readings from Last 3 Encounters:  06/25/23 50.3 kg  03/04/23 49.8 kg  01/16/23 49.4 kg    EKG today demonstrates Afib Vent. rate 104 BPM PR interval * ms QRS duration 70 ms QT/QTcB 350/460 ms   Echo 07/23/22 demonstrated   1. Left ventricular ejection fraction, by estimation, is 55 to 60%. The  left ventricle has normal function. The left ventricle has no regional  wall motion abnormalities. Left ventricular diastolic parameters are  consistent with Grade II diastolic dysfunction (pseudonormalization).   2. Right ventricular systolic function is normal. The right ventricular  size is normal. There is normal pulmonary  artery systolic pressure.   3. The mitral valve is normal in structure. Mild mitral valve  regurgitation. No evidence of mitral stenosis. There is mild prolapse of  anterior leaflet of the mitral valve.   4. Tricuspid valve regurgitation is mild to moderate.   5. The aortic valve is tricuspid. Aortic valve regurgitation is mild. No  aortic stenosis is present.   6. The inferior vena cava is normal in size with greater than 50%  respiratory variability, suggesting right atrial pressure of 3 mmHg.   Epic records are reviewed at length today  CHA2DS2-VASc Score = 4  The patient's score is based upon: CHF History: 1 HTN History: 0 Diabetes History: 0 Stroke History: 0 Vascular Disease History: 1 Age Score: 1 Gender Score: 1       ASSESSMENT AND PLAN: Persistent Atrial Fibrillation (ICD10:  I48.19) The patient's CHA2DS2-VASc score is 4, indicating a 4.8% annual risk of stroke.   S/p dofetilide loading 01/2022 Patient in afib today, asymptomatic, unclear duration. Historically, she has had a very low burden of  afib since starting dofetilide. Will have her return for ECG in two weeks, if persistent then will set her up for DCCV. We also discussed alternate home monitoring devices.  Continue dofetilide 250 mcg BID Check bmet/mag/cbc today Continue Eliquis 5 mg BID Continue Toprol 12.5 mg BID  Secondary Hypercoagulable State (ICD10:  D68.69) The patient is at significant risk for stroke/thromboembolism based upon her CHA2DS2-VASc Score of 4.  Continue Apixaban (Eliquis).   Chronic systolic CHF EF normalized Fluid status appears stable    Follow up in the AF in two weeks for ECG.    Jorja Loa PA-C Afib [facility] [address] [place], [place] [address] [phone]

## 2023-06-27 ENCOUNTER — Other Ambulatory Visit: Payer: Self-pay | Admitting: Internal Medicine

## 2023-06-27 DIAGNOSIS — Z1231 Encounter for screening mammogram for malignant neoplasm of breast: Secondary | ICD-10-CM

## 2023-07-09 ENCOUNTER — Ambulatory Visit (HOSPITAL_COMMUNITY)
Admission: RE | Admit: 2023-07-09 | Discharge: 2023-07-09 | Disposition: A | Discharge status: Home / Self Care | Payer: Medicare Other | Source: Ambulatory Visit | Attending: Physician Assistant | Admitting: Physician Assistant

## 2023-07-09 DIAGNOSIS — I4819 Other persistent atrial fibrillation: Secondary | ICD-10-CM | POA: Diagnosis present

## 2023-07-09 NOTE — Progress Notes (Signed)
Patient returns for ECG. ECG shows:  SR Vent. rate 62 BPM PR interval 154 ms QRS duration 68 ms QT/QTcB 426/432 ms  She has converted to SR. Overall low burden on her Apple Watch. Will not make any changes at this time. Follow up in the AF clinic in 6 months.

## 2023-08-11 ENCOUNTER — Ambulatory Visit
Admission: RE | Admit: 2023-08-11 | Discharge: 2023-08-11 | Disposition: A | Discharge status: Home / Self Care | Payer: Medicare Other | Source: Ambulatory Visit | Attending: Internal Medicine | Admitting: Internal Medicine

## 2023-08-11 DIAGNOSIS — Z1231 Encounter for screening mammogram for malignant neoplasm of breast: Secondary | ICD-10-CM

## 2023-10-20 ENCOUNTER — Other Ambulatory Visit (HOSPITAL_COMMUNITY): Payer: Self-pay | Admitting: Physician Assistant

## 2023-12-31 ENCOUNTER — Other Ambulatory Visit: Payer: Medicare Other

## 2023-12-31 ENCOUNTER — Ambulatory Visit
Admission: RE | Admit: 2023-12-31 | Discharge: 2023-12-31 | Disposition: A | Discharge status: Home / Self Care | Payer: Medicare Other | Source: Ambulatory Visit | Attending: Internal Medicine | Admitting: Internal Medicine

## 2023-12-31 DIAGNOSIS — M85852 Other specified disorders of bone density and structure, left thigh: Secondary | ICD-10-CM

## 2024-01-02 ENCOUNTER — Ambulatory Visit: Payer: Medicare Other | Attending: Cardiology | Admitting: Cardiology

## 2024-01-02 ENCOUNTER — Encounter: Payer: Self-pay | Admitting: Cardiology

## 2024-01-02 VITALS — BP 116/66 | HR 135 | Ht 64.0 in | Wt 111.8 lb

## 2024-01-02 DIAGNOSIS — I428 Other cardiomyopathies: Secondary | ICD-10-CM

## 2024-01-02 DIAGNOSIS — Z79899 Other long term (current) drug therapy: Secondary | ICD-10-CM

## 2024-01-02 DIAGNOSIS — I4819 Other persistent atrial fibrillation: Secondary | ICD-10-CM

## 2024-01-02 DIAGNOSIS — I341 Nonrheumatic mitral (valve) prolapse: Secondary | ICD-10-CM

## 2024-01-02 DIAGNOSIS — D6869 Other thrombophilia: Secondary | ICD-10-CM | POA: Diagnosis not present

## 2024-01-02 DIAGNOSIS — I340 Nonrheumatic mitral (valve) insufficiency: Secondary | ICD-10-CM

## 2024-01-02 NOTE — H&P (View-Only) (Signed)
 Cardiology Office Note:  .   Date:  01/02/2024  ID:  [name], DOB [birth year], MRN [id number] PCP: Arva Lathe, MD  [facility] Providers Cardiologist:  Randene Bustard, MD Electrophysiologist:  Boyce Byes, MD    Referring Provider: Arva Lathe, MD   Chief Complaint  Patient presents with   Follow-up    Early annual follow-up   Atrial Fibrillation    She does note that her heart rate have been faster and more irregular for the last several weeks.  Noticing more fatigue.    Patient Profile: .     [name] is a  78 y.o. female  with a PMH notable for symptomatic Persistent A-fib and Nonischemic Cardiomyopathy (valvular-mostly resolved) and Idiopathic Pericardial Effusion that is also resolved who presents here for early annual follow-up .  [name] is a very pleasant / anxious 78 y.o. female with a PMH noted below, who presents here for annual Primary Cardiologist.  Follow-up at the request of Arva Lathe,*.  Notable PMH: Symptomatic Persistent A-Fib Initially on amiodarone , but TFTs and PFTs necessitated stopping.  Plan was to consider dofetilide /Tikosyn  Started on Tikosyn  01/22/2022 Complicated by Nonischemic (Tachycardic) Cardiomyopathy with moderate-severe TR and moderate MR -> now resolved ECHO (07/23/2022): Normal EF of 55 to 60%.  No RWMA.  GR 2 DD.  Normal PAP.  Mild MVP of anterior leaflet.  Mild MR.  Mild to moderate TR.  Mild AI.  No AS.  Normal RAP Idiopathic Pericardial Effusion -> now resolved   [name] was last seen on March 04, 2023 for primary cardiology follow-up having just been seen 2 months prior in the A-fib clinic.  She was doing well on Tikosyn .  Had a brief episodes of A-fib lasting less than an hour which she describes where a fluttering sensation less frequent and less prominent.  She was walking on the treadmill and able to get her heart rate into the  110-120 range without any major issues. => Continued on Tikosyn  250 mcg twice daily along with Toprol  12.5 mg daily and Eliquis .  Was on ARB for afterload reduction.  Not requiring diuretic.  Her most recent A-fib clinic visit was on 06/25/2023 with Mr. Lujean Sake.  Again she is doing well, and not aware of being in A-fib.  She usually is able to identify her A-fib because her smart watch alerts her.  Otherwise is not necessarily noting any symptoms.=> She returned 2 weeks later and was in sinus rhythm.  Subjective  Discussed the use of AI scribe software for clinical note transcription with the patient, who gave verbal consent to proceed.  History of Present Illness [name] presents with recurrent episodes of fatigue and weakness.  Over the past couple of weeks, she has experienced recurrent episodes of fatigue and weakness, described as 'spells', occurring at rest during activities like playing solitaire or reading. These episodes make her feel as though she might pass out, although she never does. She attributes these symptoms to being in atrial fibrillation, as indicated by her Apple watch, which shows her heart rhythm as irregular.  She is currently taking Tikosyn , which had previously been effective in maintaining her heart rhythm. She has not seen her cardiologist since being discharged from the hospital in November 2023. She is also on metoprolol , taking half a tablet twice a day, and losartan , which she has been advised to hold temporarily.  Her heart rate has been variable, with her Apple watch showing rates  up to 116 bpm during rest. When not in atrial fibrillation, her heart rate ranges from 40 to 95 bpm. She notes that her heart rate was stable at 116 bpm while on the treadmill at the gym, which she considers normal for her.  No chest pain, significant shortness of breath, or orthopnea. She notes some swelling in her ankles, particularly after prolonged sitting, such as  during a car ride two weeks ago. She does not experience shortness of breath when lying flat or waking up at night.  She has a history of mild mitral valve (anterior leaflet) prolapse, which can worsen with atrial fibrillation,    Objective   Medications - Tikosyn  250 mcg twice daily - Toprol  XL 25 mg, 1/2 tablet twice a day - Losartan  25 mg daily - Apixaban  5 mg twice daily - Mag-Ox 400 milligram daily; zinc 50 mg 3 times a week; biotin 10,000 mcg daily calcium D5 100-3 tabs daily, glucosamine conjoint and daily  -Levothyroxine  100 mcg daily  Studies Reviewed: Aaron Aas   EKG Interpretation Date/Time:  Friday Jan 02 2024 09:50:45 EDT Ventricular Rate:  135 PR Interval:    QRS Duration:  72 QT Interval:  320 QTC Calculation: 480 R Axis:   40  Text Interpretation: Atrial fibrillation with rapid ventricular response with premature ventricular or aberrantly conducted complexes Low voltage QRS Cannot rule out Anterior infarct (cited on or before 02-Jan-2024) When compared with ECG of 09-Jul-2023 13:20, Atrial fibrillation has replaced Sinus rhythm Vent. rate has increased BY  73 BPM Confirmed by Randene Bustard ([id number]) on 01/02/2024 10:10:37 AM    No new studies Prior Studies Reviewed: Cardiac MRI in 03/30/23normal EF right and left.  No infarction.  Moderate pericardial effusion.  R LHC in November 2022 showed angiographically normal coronaries.  Relatively normal right heart cath numbers with a wedge pressure of 20 mmHg and mean RAP of 12 mmHg.  Lab Results  Component Value Date   NA 138 06/25/2023   K 4.1 06/25/2023   CREATININE 0.86 06/25/2023   GFRNONAA >60 06/25/2023   GLUCOSE 114 (H) 06/25/2023   Lab Results  Component Value Date   CHOL 212 (H) 09/07/2021   HDL 93 09/07/2021   LDLCALC 109 (H) 09/07/2021   TRIG 54 09/07/2021   CHOLHDL 2.3 09/07/2021    Risk Assessment/Calculations:    CHA2DS2-VASc Score = 4   This indicates a 4.8% annual risk of stroke. The patient's  score is based upon: CHF History: 1 HTN History: 0 Diabetes History: 0 Stroke History: 0 Vascular Disease History: 1 Age Score: 1 Gender Score: 1   On DOAC           Physical Exam:   VS:  BP 116/66 (BP Location: Left Arm, Patient Position: Sitting)   Pulse (!) 135   Ht 5\' 4"  (1.626 m)   Wt 111 lb 12.8 oz (50.7 kg)   SpO2 (!) 79%   BMI 19.19 kg/m    Wt Readings from Last 3 Encounters:  01/02/24 111 lb 12.8 oz (50.7 kg)  06/25/23 110 lb 12.8 oz (50.3 kg)  03/04/23 109 lb 12.8 oz (49.8 kg)    GEN: Healthy appearing.  Well nourished, well groomed in no acute distress;  NECK: No JVD; No carotid bruits CARDIAC: Irregular irregular rate with rapid rhythm.  Normal S1, S2; no obvious murmurs, rubs, gallops RESPIRATORY:  Clear to auscultation without rales, wheezing or rhonchi ; nonlabored, good air movement. ABDOMEN: Soft, non-tender, non-distended EXTREMITIES:  No  edema; No deformity     ASSESSMENT AND PLAN: .    Problem List Items Addressed This Visit       Cardiology Problems   Hypercoagulable state due to persistent atrial fibrillation : CHA2DS2-VASc score 4 (Chronic)   CHA2DS2-VASc score is 4.  She is on Eliquis  and tolerating it well with no major bleeding issues. -With planned cardioversion, would not want to hold for at least a month post cardioversion, but otherwise would be okay to hold Eliquis  for 2 to 3 days preop for surgeries or procedures.      Relevant Orders   EKG 12-Lead (Completed)   ECHOCARDIOGRAM COMPLETE   Basic metabolic panel with GFR   CBC   Mitral valve regurgitation due to prolapse of cusp (Chronic)   Mitral valve prolapse with potential exacerbation during atrial fibrillation episodes. Previous echocardiogram showed prolapse. Prolonged atrial fibrillation can exacerbate mitral valve leakage. - Reassess mitral valve function with echocardiogram in December 2025.      Relevant Orders   EKG 12-Lead (Completed)   ECHOCARDIOGRAM COMPLETE    Basic metabolic panel with GFR   CBC   Nonischemic cardiomyopathy (HCC) (Chronic)   Minimal valvular disease and nonischemic evaluation which suggested probably her cardiomyopathy was related to A-fib RVR with valvular disease. Most recent EF was 55 to 60%.  Due for follow-up echocardiogram in the end of the year to reassess the EF but also mitral valvular disease.      Relevant Orders   EKG 12-Lead (Completed)   ECHOCARDIOGRAM COMPLETE   Basic metabolic panel with GFR   CBC   Persistent atrial fibrillation (HCC) -> CHA2DS2-VASc score 4 (CHF, aortic plaque, Age 78, female) - Primary (Chronic)   Unfortunately, she is back in A-fib. Recurrent atrial fibrillation with rapid heart rate and symptoms of weakness and fatigue.  Current medications include Toprol -XL 12.5 mg twice daily and Tikosyn  250 mg twice daily.   - Increase metoprolol  succinate to a full 25 mg tablet twice daily.-Can reassess in A-fib clinic post cardioversion) - Hold losartan  while increasing metoprolol . - Plan for cardioversion early the week of June 2nd if symptoms persist.  (Scheduled for June 4) - Advise ER visit if symptoms worsen over the weekend. - Reassess symptoms post-cardioversion and adjust treatment plan accordingly. =>  Scheduled to be seen in the A-fib clinic by Mr. Elmon Hagedorn, [place] on June 5.      Relevant Orders   EKG 12-Lead (Completed)   ECHOCARDIOGRAM COMPLETE   Basic metabolic panel with GFR   CBC     Other   On dofetilide  therapy   Converted from amiodarone  to Tikosyn  due to concerns with thyroid  issues.  Being followed by EP. \ Unfortunate, she is now having more breakthrough spells of A-fib.  Where she to go back into A-fib again, I would probably try to refer back to Dr. Lawana Pray            Informed Consent   Shared Decision Making/Informed Consent The risks (stroke, cardiac arrhythmias rarely resulting in the need for a temporary or permanent pacemaker, skin irritation or burns and  complications associated with conscious sedation including aspiration, arrhythmia, respiratory failure and death), benefits (restoration of normal sinus rhythm) and alternatives of a direct current cardioversion were explained in detail to Ms. Passage and she agrees to proceed.       Follow-Up: Return in about 7 months (around 08/03/2024).  Total time spent: 21 min spent with patient + 22 min spent charting = 43  min    Signed, Arleen Lacer, MD, MS Randene Bustard, M.D., M.S. Interventional Chartered certified accountant  Pager # [phone]

## 2024-01-02 NOTE — Patient Instructions (Addendum)
 Medication Instructions:   Starting tonight  full dose of Metoprolol  take 25 mg twice a day  until you see Afib clinic  Stop taking Losartan  until you see Afib clinic   *If you need a refill on your cardiac medications before your next appointment, please call your pharmacy*   Lab Work: BMP CBC If you have labs (blood work) drawn today and your tests are completely normal, you will receive your results only by: MyChart Message (if you have MyChart) OR A paper copy in the mail If you have any lab test that is abnormal or we need to change your treatment, we will call you to review the results.   Testing/Procedures: Schedule for January 14 2024 Your physician has recommended that you have a Cardioversion (DCCV). Electrical Cardioversion uses a jolt of electricity to your heart either through paddles or wired patches attached to your chest. This is a controlled, usually prescheduled, procedure. Defibrillation is done under light anesthesia in the hospital, and you usually go home the day of the procedure. This is done to get your heart back into a normal rhythm. You are not awake for the procedure. Please see the instruction sheet given to you today.     Schedule in Dec 2025 Your physician has requested that you have an echocardiogram. Echocardiography is a painless test that uses sound waves to create images of your heart. It provides your doctor with information about the size and shape of your heart and how well your heart's chambers and valves are working. This procedure takes approximately one hour. There are no restrictions for this procedure. Please do NOT wear cologne, perfume, aftershave, or lotions (deodorant is allowed). Please arrive 15 minutes prior to your appointment time.  Please note: We ask at that you not bring children with you during ultrasound (echo/ vascular) testing. Due to room size and safety concerns, children are not allowed in the ultrasound rooms during exams. Our  front office staff cannot provide observation of children in our lobby area while testing is being conducted. An adult accompanying a patient to their appointment will only be allowed in the ultrasound room at the discretion of the ultrasound technician under special circumstances. We apologize for any inconvenience.  Follow-Up: At [facility], you and your health needs are our priority.  As part of our continuing mission to provide you with exceptional heart care, we have created designated Provider Care Teams.  These Care Teams include your primary Cardiologist (physician) and Advanced Practice Providers (APPs -  Physician Assistants and Nurse Practitioners) who all work together to provide you with the care you need, when you need it.     Your next appointment:   7 month(s) after echo in Dec 2025  The format for your next appointment:   In Person  Provider:   Randene Bustard, MD   Other Instructions      Dear [name]  You are scheduled for a Cardioversion on Wednesday, June 4 with Dr. Stann Earnest .  Please arrive at the [facility] (Main Entrance A) at [facility]: [address] [place], [place] [address] at 8:00 AM (This time is 1 hour(s) before your procedure to ensure your preparation).   Free valet parking service is available. You will check in at ADMITTING.   *Please Note: You will receive a call the day before your procedure to confirm the appointment time. That time may have changed from the original time based on the schedule for that day.*  DIET:  Nothing to eat or drink after midnight except a sip of water with medications (see medication instructions below)  MEDICATION INSTRUCTIONS: !!IF ANY NEW MEDICATIONS ARE STARTED AFTER TODAY, PLEASE NOTIFY YOUR PROVIDER AS SOON AS POSSIBLE!!  FYI: Medications such as Semaglutide (Ozempic, [place]), Tirzepatide (Mounjaro, Zepbound), Dulaglutide (Trulicity), etc ("GLP1 agonists") AND Canagliflozin (Invokana),  Dapagliflozin (Farxiga), Empagliflozin (Jardiance), Ertugliflozin (Steglatro), Bexagliflozin [employer]) or any combination with one of these drugs such as Invokamet (Canagliflozin/Metformin), Synjardy (Empagliflozin/Metformin), etc ("SGLT2 inhibitors") must be held around the time of a procedure. This is not a comprehensive list of all of these drugs. Please review all of your medications and talk to your provider if you take any one of these. If you are not sure, ask your provider.     Continue taking your anticoagulant (blood thinner): Apixaban  (Eliquis ).  You will need to continue this after your procedure until you are told by your provider that it is safe to stop.    LABS:today  CBC BMP   FYI:  For your safety, and to allow us  to monitor your vital signs accurately during the surgery/procedure we request: If you have artificial nails, gel coating, SNS etc, please have those removed prior to your surgery/procedure. Not having the nail coverings /polish removed may result in cancellation or delay of your surgery/procedure.  Your support person will be asked to wait in the waiting room during your procedure.  It is OK to have someone drop you off and come back when you are ready to be discharged.  You cannot drive after the procedure and will need someone to drive you home.  Bring your insurance cards.  *Special Note: Every effort is made to have your procedure done on time. Occasionally there are emergencies that occur at the hospital that may cause delays. Please be patient if a delay does occur.

## 2024-01-02 NOTE — Assessment & Plan Note (Signed)
 Converted from amiodarone  to Tikosyn  due to concerns with thyroid  issues.  Being followed by EP. \ Unfortunate, she is now having more breakthrough spells of A-fib.  Where she to go back into A-fib again, I would probably try to refer back to Dr. Lawana Pray

## 2024-01-02 NOTE — Assessment & Plan Note (Signed)
 Overall has been pretty well-controlled, but now having issues with recurrent PAF with RVR.  Main symptom is fatigue.  She is much less symptomatic than she had been initially. -Will plan to increase beta-blocker dose and schedule for cardioversion prior to her follow-up in the A-fib clinic. -Due for 2D echo for reassessment of her mitral valve prolapse at the end of the year.

## 2024-01-02 NOTE — Assessment & Plan Note (Signed)
 Unfortunately, she is back in A-fib. Recurrent atrial fibrillation with rapid heart rate and symptoms of weakness and fatigue.  Current medications include Toprol -XL 12.5 mg twice daily and Tikosyn  250 mg twice daily.   - Increase metoprolol  succinate to a full 25 mg tablet twice daily.-Can reassess in A-fib clinic post cardioversion) - Hold losartan  while increasing metoprolol . - Plan for cardioversion early the week of June 2nd if symptoms persist.  (Scheduled for June 4) - Advise ER visit if symptoms worsen over the weekend. - Reassess symptoms post-cardioversion and adjust treatment plan accordingly. =>  Scheduled to be seen in the A-fib clinic by Mr. Elmon Hagedorn, [place] on June 5.

## 2024-01-02 NOTE — Assessment & Plan Note (Signed)
 Mitral valve prolapse with potential exacerbation during atrial fibrillation episodes. Previous echocardiogram showed prolapse. Prolonged atrial fibrillation can exacerbate mitral valve leakage. - Reassess mitral valve function with echocardiogram in December 2025.

## 2024-01-02 NOTE — Progress Notes (Signed)
 Cardiology Office Note:  .   Date:  01/02/2024  ID:  [name], DOB [birth year], MRN [id number] PCP: Arva Lathe, MD  [facility] Providers Cardiologist:  Randene Bustard, MD Electrophysiologist:  Boyce Byes, MD    Referring Provider: Arva Lathe, MD   Chief Complaint  Patient presents with   Follow-up    Early annual follow-up   Atrial Fibrillation    She does note that her heart rate have been faster and more irregular for the last several weeks.  Noticing more fatigue.    Patient Profile: .     [name] is a  78 y.o. female  with a PMH notable for symptomatic Persistent A-fib and Nonischemic Cardiomyopathy (valvular-mostly resolved) and Idiopathic Pericardial Effusion that is also resolved who presents here for early annual follow-up .  [name] is a very pleasant / anxious 78 y.o. female with a PMH noted below, who presents here for annual Primary Cardiologist.  Follow-up at the request of Arva Lathe,*.  Notable PMH: Symptomatic Persistent A-Fib Initially on amiodarone , but TFTs and PFTs necessitated stopping.  Plan was to consider dofetilide /Tikosyn  Started on Tikosyn  01/22/2022 Complicated by Nonischemic (Tachycardic) Cardiomyopathy with moderate-severe TR and moderate MR -> now resolved ECHO (07/23/2022): Normal EF of 55 to 60%.  No RWMA.  GR 2 DD.  Normal PAP.  Mild MVP of anterior leaflet.  Mild MR.  Mild to moderate TR.  Mild AI.  No AS.  Normal RAP Idiopathic Pericardial Effusion -> now resolved   [name] was last seen on March 04, 2023 for primary cardiology follow-up having just been seen 2 months prior in the A-fib clinic.  She was doing well on Tikosyn .  Had a brief episodes of A-fib lasting less than an hour which she describes where a fluttering sensation less frequent and less prominent.  She was walking on the treadmill and able to get her heart rate into the  110-120 range without any major issues. => Continued on Tikosyn  250 mcg twice daily along with Toprol  12.5 mg daily and Eliquis .  Was on ARB for afterload reduction.  Not requiring diuretic.  Her most recent A-fib clinic visit was on 06/25/2023 with Mr. Lujean Sake.  Again she is doing well, and not aware of being in A-fib.  She usually is able to identify her A-fib because her smart watch alerts her.  Otherwise is not necessarily noting any symptoms.=> She returned 2 weeks later and was in sinus rhythm.  Subjective  Discussed the use of AI scribe software for clinical note transcription with the patient, who gave verbal consent to proceed.  History of Present Illness [name] presents with recurrent episodes of fatigue and weakness.  Over the past couple of weeks, she has experienced recurrent episodes of fatigue and weakness, described as 'spells', occurring at rest during activities like playing solitaire or reading. These episodes make her feel as though she might pass out, although she never does. She attributes these symptoms to being in atrial fibrillation, as indicated by her Apple watch, which shows her heart rhythm as irregular.  She is currently taking Tikosyn , which had previously been effective in maintaining her heart rhythm. She has not seen her cardiologist since being discharged from the hospital in November 2023. She is also on metoprolol , taking half a tablet twice a day, and losartan , which she has been advised to hold temporarily.  Her heart rate has been variable, with her Apple watch showing rates  up to 116 bpm during rest. When not in atrial fibrillation, her heart rate ranges from 40 to 95 bpm. She notes that her heart rate was stable at 116 bpm while on the treadmill at the gym, which she considers normal for her.  No chest pain, significant shortness of breath, or orthopnea. She notes some swelling in her ankles, particularly after prolonged sitting, such as  during a car ride two weeks ago. She does not experience shortness of breath when lying flat or waking up at night.  She has a history of mild mitral valve (anterior leaflet) prolapse, which can worsen with atrial fibrillation,    Objective   Medications - Tikosyn  250 mcg twice daily - Toprol  XL 25 mg, 1/2 tablet twice a day - Losartan  25 mg daily - Apixaban  5 mg twice daily - Mag-Ox 400 milligram daily; zinc 50 mg 3 times a week; biotin 10,000 mcg daily calcium D5 100-3 tabs daily, glucosamine conjoint and daily  -Levothyroxine  100 mcg daily  Studies Reviewed: [name]   EKG Interpretation Date/Time:  Friday Jan 02 2024 09:50:45 EDT Ventricular Rate:  135 PR Interval:    QRS Duration:  72 QT Interval:  320 QTC Calculation: 480 R Axis:   40  Text Interpretation: Atrial fibrillation with rapid ventricular response with premature ventricular or aberrantly conducted complexes Low voltage QRS Cannot rule out Anterior infarct (cited on or before 02-Jan-2024) When compared with ECG of 09-Jul-2023 13:20, Atrial fibrillation has replaced Sinus rhythm Vent. rate has increased BY  73 BPM Confirmed by Randene Bustard ([id number]) on 01/02/2024 10:10:37 AM    No new studies Prior Studies Reviewed: Cardiac MRI in 03/30/23normal EF right and left.  No infarction.  Moderate pericardial effusion.  R LHC in November 2022 showed angiographically normal coronaries.  Relatively normal right heart cath numbers with a wedge pressure of 20 mmHg and mean RAP of 12 mmHg.  Lab Results  Component Value Date   NA 138 06/25/2023   K 4.1 06/25/2023   CREATININE 0.86 06/25/2023   GFRNONAA >60 06/25/2023   GLUCOSE 114 (H) 06/25/2023   Lab Results  Component Value Date   CHOL 212 (H) 09/07/2021   HDL 93 09/07/2021   LDLCALC 109 (H) 09/07/2021   TRIG 54 09/07/2021   CHOLHDL 2.3 09/07/2021    Risk Assessment/Calculations:    CHA2DS2-VASc Score = 4   This indicates a 4.8% annual risk of stroke. The patient's  score is based upon: CHF History: 1 HTN History: 0 Diabetes History: 0 Stroke History: 0 Vascular Disease History: 1 Age Score: 1 Gender Score: 1   On DOAC           Physical Exam:   VS:  BP 116/66 (BP Location: Left Arm, Patient Position: Sitting)   Pulse (!) 135   Ht 5\' 4"  (1.626 m)   Wt 111 lb 12.8 oz (50.7 kg)   SpO2 (!) 79%   BMI 19.19 kg/m    Wt Readings from Last 3 Encounters:  01/02/24 111 lb 12.8 oz (50.7 kg)  06/25/23 110 lb 12.8 oz (50.3 kg)  03/04/23 109 lb 12.8 oz (49.8 kg)    GEN: Healthy appearing.  Well nourished, well groomed in no acute distress;  NECK: No JVD; No carotid bruits CARDIAC: Irregular irregular rate with rapid rhythm.  Normal S1, S2; no obvious murmurs, rubs, gallops RESPIRATORY:  Clear to auscultation without rales, wheezing or rhonchi ; nonlabored, good air movement. ABDOMEN: Soft, non-tender, non-distended EXTREMITIES:  No  edema; No deformity     ASSESSMENT AND PLAN: .    Problem List Items Addressed This Visit       Cardiology Problems   Hypercoagulable state due to persistent atrial fibrillation : CHA2DS2-VASc score 4 (Chronic)   CHA2DS2-VASc score is 4.  She is on Eliquis  and tolerating it well with no major bleeding issues. -With planned cardioversion, would not want to hold for at least a month post cardioversion, but otherwise would be okay to hold Eliquis  for 2 to 3 days preop for surgeries or procedures.      Relevant Orders   EKG 12-Lead (Completed)   ECHOCARDIOGRAM COMPLETE   Basic metabolic panel with GFR   CBC   Mitral valve regurgitation due to prolapse of cusp (Chronic)   Mitral valve prolapse with potential exacerbation during atrial fibrillation episodes. Previous echocardiogram showed prolapse. Prolonged atrial fibrillation can exacerbate mitral valve leakage. - Reassess mitral valve function with echocardiogram in December 2025.      Relevant Orders   EKG 12-Lead (Completed)   ECHOCARDIOGRAM COMPLETE    Basic metabolic panel with GFR   CBC   Nonischemic cardiomyopathy (HCC) (Chronic)   Minimal valvular disease and nonischemic evaluation which suggested probably her cardiomyopathy was related to A-fib RVR with valvular disease. Most recent EF was 55 to 60%.  Due for follow-up echocardiogram in the end of the year to reassess the EF but also mitral valvular disease.      Relevant Orders   EKG 12-Lead (Completed)   ECHOCARDIOGRAM COMPLETE   Basic metabolic panel with GFR   CBC   Persistent atrial fibrillation (HCC) -> CHA2DS2-VASc score 4 (CHF, aortic plaque, Age 78, female) - Primary (Chronic)   Unfortunately, she is back in A-fib. Recurrent atrial fibrillation with rapid heart rate and symptoms of weakness and fatigue.  Current medications include Toprol -XL 12.5 mg twice daily and Tikosyn  250 mg twice daily.   - Increase metoprolol  succinate to a full 25 mg tablet twice daily.-Can reassess in A-fib clinic post cardioversion) - Hold losartan  while increasing metoprolol . - Plan for cardioversion early the week of June 2nd if symptoms persist.  (Scheduled for June 4) - Advise ER visit if symptoms worsen over the weekend. - Reassess symptoms post-cardioversion and adjust treatment plan accordingly. =>  Scheduled to be seen in the A-fib clinic by Mr. Elmon Hagedorn, [place] on June 5.      Relevant Orders   EKG 12-Lead (Completed)   ECHOCARDIOGRAM COMPLETE   Basic metabolic panel with GFR   CBC     Other   On dofetilide  therapy   Converted from amiodarone  to Tikosyn  due to concerns with thyroid  issues.  Being followed by EP. \ Unfortunate, she is now having more breakthrough spells of A-fib.  Where she to go back into A-fib again, I would probably try to refer back to Dr. Lawana Pray            Informed Consent   Shared Decision Making/Informed Consent The risks (stroke, cardiac arrhythmias rarely resulting in the need for a temporary or permanent pacemaker, skin irritation or burns and  complications associated with conscious sedation including aspiration, arrhythmia, respiratory failure and death), benefits (restoration of normal sinus rhythm) and alternatives of a direct current cardioversion were explained in detail to [name] and she agrees to proceed.       Follow-Up: Return in about 7 months (around 08/03/2024).  Total time spent: 21 min spent with patient + 22 min spent charting = 43  min    Signed, Arleen Lacer, MD, MS Randene Bustard, M.D., M.S. Interventional Chartered certified accountant  Pager # [phone]

## 2024-01-02 NOTE — Assessment & Plan Note (Addendum)
 Minimal valvular disease and nonischemic evaluation which suggested probably her cardiomyopathy was related to A-fib RVR with valvular disease. Most recent EF was 55 to 60%.  Due for follow-up echocardiogram in the end of the year to reassess the EF but also mitral valvular disease.

## 2024-01-02 NOTE — Assessment & Plan Note (Signed)
 CHA2DS2-VASc score is 4.  She is on Eliquis  and tolerating it well with no major bleeding issues. -With planned cardioversion, would not want to hold for at least a month post cardioversion, but otherwise would be okay to hold Eliquis  for 2 to 3 days preop for surgeries or procedures.

## 2024-01-03 LAB — BASIC METABOLIC PANEL WITH GFR
BUN/Creatinine Ratio: 24 (ref 12–28)
BUN: 19 mg/dL (ref 8–27)
CO2: 20 mmol/L (ref 20–29)
Calcium: 10 mg/dL (ref 8.7–10.3)
Chloride: 101 mmol/L (ref 96–106)
Creatinine, Ser: 0.78 mg/dL (ref 0.57–1.00)
Glucose: 105 mg/dL — ABNORMAL HIGH (ref 70–99)
Potassium: 4.2 mmol/L (ref 3.5–5.2)
Sodium: 141 mmol/L (ref 134–144)
eGFR: 79 mL/min/{1.73_m2} (ref >59)

## 2024-01-03 LAB — CBC
Hematocrit: 44.1 % (ref 34.0–46.6)
Hemoglobin: 14.3 g/dL (ref 11.1–15.9)
MCH: 32.4 pg (ref 26.6–33.0)
MCHC: 32.4 g/dL (ref 31.5–35.7)
MCV: 100 fL — ABNORMAL HIGH (ref 79–97)
Platelets: 246 10*3/uL (ref 150–450)
RBC: 4.41 x10E6/uL (ref 3.77–5.28)
RDW: 11.9 % (ref 11.7–15.4)
WBC: 7.2 10*3/uL (ref 3.4–10.8)

## 2024-01-13 NOTE — Progress Notes (Signed)
 Pt called for pre procedure instructions. Arrival time 0800 NPO after midnight explained Instructed to take am meds with sip of water and confirmed blood thinner consistency Instructed pt need for ride home tomorrow and have responsible adult with them for 24 hrs post procedure.

## 2024-01-14 ENCOUNTER — Encounter (HOSPITAL_COMMUNITY)
Admission: RE | Disposition: A | Discharge status: Home / Self Care | Payer: Self-pay | Source: Home / Self Care | Attending: Cardiovascular Disease

## 2024-01-14 ENCOUNTER — Ambulatory Visit (HOSPITAL_COMMUNITY)
Admission: RE | Admit: 2024-01-14 | Discharge: 2024-01-14 | Disposition: A | Discharge status: Home / Self Care | Attending: Cardiovascular Disease | Admitting: Cardiovascular Disease

## 2024-01-14 ENCOUNTER — Encounter (HOSPITAL_COMMUNITY): Payer: Self-pay

## 2024-01-14 DIAGNOSIS — I428 Other cardiomyopathies: Secondary | ICD-10-CM | POA: Insufficient documentation

## 2024-01-14 DIAGNOSIS — Z539 Procedure and treatment not carried out, unspecified reason: Secondary | ICD-10-CM | POA: Insufficient documentation

## 2024-01-14 DIAGNOSIS — I4819 Other persistent atrial fibrillation: Secondary | ICD-10-CM | POA: Diagnosis not present

## 2024-01-14 DIAGNOSIS — Z7901 Long term (current) use of anticoagulants: Secondary | ICD-10-CM | POA: Insufficient documentation

## 2024-01-14 DIAGNOSIS — Z79899 Other long term (current) drug therapy: Secondary | ICD-10-CM | POA: Insufficient documentation

## 2024-01-14 DIAGNOSIS — D6869 Other thrombophilia: Secondary | ICD-10-CM | POA: Diagnosis not present

## 2024-01-14 DIAGNOSIS — I340 Nonrheumatic mitral (valve) insufficiency: Secondary | ICD-10-CM | POA: Insufficient documentation

## 2024-01-14 SURGERY — CARDIOVERSION (CATH LAB)
Anesthesia: Monitor Anesthesia Care

## 2024-01-14 NOTE — Interval H&P Note (Signed)
 History and Physical Interval Note:  01/14/2024 8:07 AM  [name]  has presented today for surgery, with the diagnosis of AFIB.  The various methods of treatment have been discussed with the patient and family. After consideration of risks, benefits and other options for treatment, the patient has consented to  Procedure(s): CARDIOVERSION (N/A) as a surgical intervention.  The patient's history has been reviewed, patient examined, no change in status, stable for surgery.  I have reviewed the patient's chart and labs.  Questions were answered to the patient's satisfaction.     Janelle Mediate

## 2024-01-14 NOTE — Progress Notes (Signed)
 Pt arrived for cardioversion this am.  Found to be in NSR.  EKG confirmed, seen by Dr Stann Earnest.  Pt discharged.

## 2024-01-15 ENCOUNTER — Ambulatory Visit (HOSPITAL_COMMUNITY)
Admission: RE | Admit: 2024-01-15 | Discharge: 2024-01-15 | Disposition: A | Discharge status: Home / Self Care | Payer: Medicare Other | Source: Ambulatory Visit | Attending: Physician Assistant | Admitting: Physician Assistant

## 2024-01-15 ENCOUNTER — Encounter (HOSPITAL_COMMUNITY): Payer: Self-pay | Admitting: Physician Assistant

## 2024-01-15 VITALS — BP 98/60 | HR 114 | Ht 64.0 in | Wt 112.2 lb

## 2024-01-15 DIAGNOSIS — I4819 Other persistent atrial fibrillation: Secondary | ICD-10-CM

## 2024-01-15 DIAGNOSIS — Z79899 Other long term (current) drug therapy: Secondary | ICD-10-CM

## 2024-01-15 DIAGNOSIS — Z5181 Encounter for therapeutic drug level monitoring: Secondary | ICD-10-CM

## 2024-01-15 DIAGNOSIS — D6869 Other thrombophilia: Secondary | ICD-10-CM

## 2024-01-15 DIAGNOSIS — I484 Atypical atrial flutter: Secondary | ICD-10-CM | POA: Diagnosis not present

## 2024-01-15 MED ORDER — METOPROLOL SUCCINATE ER 25 MG PO TB24: 25.0000 mg | ORAL_TABLET | Freq: Two times a day (BID) | ORAL | 3 refills | Status: DC

## 2024-01-15 NOTE — Progress Notes (Addendum)
 Primary Care Physician: Arva Lathe, MD Primary Cardiologist: Dr Addie Holstein Primary Electrophysiologist: Dr Marven Slimmer Referring Physician: Dr Delmar Ferrara [name] is a 78 y.o. female with a history of chronic systolic CHF, moderate to severe TR, endometrial cancer, multiple sclerosis, atrial fibrillation who presents for follow up in the [facility]. She had been maintained on amiodarone  but had dysregulation of her thyroid  as well as abnormal PFT and amiodarone  was discontinued. Patient is on Eliquis  for stroke prevention. She was seen by Dr Marven Slimmer 09/19/21, not felt to be a good ablation candidate given chronic pericardial effusion. Dofetilide  was recommended. Patient is s/p dofetilide  admission 01/2022.   Patient returns for follow up for atrial fibrillation and dofetilide  monitoring. Patient was seen by Dr Addie Holstein on 01/02/24 and was back in afib. She presented for DCCV on 01/14/24 but was in SR. She is back in atypical atrial flutter today. No bleeding issues on anticoagulation.   Today, she  denies symptoms of chest pain, shortness of breath, orthopnea, PND, lower extremity edema, dizziness, presyncope, syncope, snoring, daytime somnolence, bleeding, or neurologic sequela. The patient is tolerating medications without difficulties and is otherwise without complaint today.    Atrial Fibrillation Risk Factors:  she does not have symptoms or diagnosis of sleep apnea. she does not have a history of rheumatic fever.   Atrial Fibrillation Management history:  Previous antiarrhythmic drugs: amiodarone , dofetilide   Previous cardioversions: none Previous ablations: none Anticoagulation history: Eliquis    Past Medical History:  Diagnosis Date   A-fib (HCC)    Dilated cardiomyopathy (HCC) 05/28/2021   NON-ISCHEMIC -> with restoration of sinus rhythm, EF improved from 25-30% up to 40 and 45%.  Moderate-severe TR noted   Diverticulosis 2012    With intermittent diverticulitis   Endometrial cancer (HCC) 2009   S/p vaginal hysterectomy   Family history of adverse reaction to anesthesia    mother ponv   Glaucoma    Hypothyroidism due to thyroiditis treated with radioactive iodine    Idiopathic pericardial effusion 05/16/2021   Migraine    Mitral valve regurgitation due to prolapse of cusp 05/16/2021   Echo: Moderate MR with holosystolic prolapse of the posterior MV   Multiple sclerosis (HCC) 2008   Well-controlled, currently not on medications.  Repatha subcu 3 times weekly from 2008-2021.  No further relapse.   Persistent atrial fibrillation (HCC) 05/02/2021    Current Outpatient Medications  Medication Sig Dispense Refill   acetaminophen  (TYLENOL ) 500 MG tablet Take 500-1,000 mg by mouth every 6 (six) hours as needed for moderate pain or headache.     apixaban  (ELIQUIS ) 5 MG TABS tablet Take 1 tablet (5 mg total) by mouth 2 (two) times daily. 60 tablet 11   b complex vitamins capsule Take 1 capsule by mouth daily.     Biotin 16109 MCG TABS Take 10,000 mcg by mouth daily.     Calcium Carb-Cholecalciferol  (CALCIUM 500/VITAMIN D PO) Take 2 each by mouth daily. Chewables     Cholecalciferol  (VITAMIN D) 50 MCG (2000 UT) tablet Take 2,000 Units by mouth daily.     dofetilide  (TIKOSYN ) 250 MCG capsule TAKE 1 CAPSULE BY MOUTH TWICE  DAILY 180 capsule 1   latanoprost  (XALATAN ) 0.005 % ophthalmic solution Place 1 drop into both eyes at bedtime.  6   levothyroxine  (SYNTHROID ) 88 MCG tablet Take 88 mcg by mouth daily before breakfast.     magnesium  oxide (MAG-OX) 400 MG tablet Take 400 mg by mouth daily.  metoprolol  succinate (TOPROL  XL) 25 MG 24 hr tablet Take 0.5 tablets (12.5 mg total) by mouth in the morning and at bedtime. (Patient taking differently: Take 25 mg by mouth in the morning and at bedtime.) 30 tablet 11   Misc Natural Products (GLUCOSAMINE CHOND COMPLEX/MSM PO) Take 1 tablet by mouth daily. Replenex     potassium  chloride SA (KLOR-CON  M20) 20 MEQ tablet TAKE 1 TABLET (20 MEQ TOTAL) BY MOUTH 2 (TWO) TIMES DAILY. TAKE ONE TABLET BY MOUTH ONCE DAILY 180 tablet 2   Prenatal Vit-Fe Fumarate-FA (PRENATAL MULTIVITAMIN) TABS tablet Take 1 tablet by mouth daily.     SUMAtriptan  (IMITREX ) 100 MG tablet TAKE 1 TABLET BY MOUTH AS NEEDED FOR HEADACHE MAY REPEAT IN 2 HOURS IF HEADACHE PERSIST 10 tablet 3   Vitamin A 2400 MCG (8000 UT) CAPS Take 8,000 Units by mouth 3 (three) times a week.     zinc gluconate 50 MG tablet Take 50 mg by mouth 3 (three) times a week.     losartan  (COZAAR ) 25 MG tablet Take 1 tablet (25 mg total) by mouth daily. (Patient not taking: Reported on 01/06/2024) 30 tablet 11   No current facility-administered medications for this encounter.    ROS- All systems are reviewed and negative except as per the HPI above.  Physical Exam: Vitals:   01/15/24 1337  BP: 98/60  Pulse: (!) 114  Weight: 50.9 kg  Height: 5\' 4"  (1.626 m)     GEN: Well nourished, well developed in no acute distress CARDIAC: Irregularly irregular rate and rhythm, no murmurs, rubs, gallops RESPIRATORY:  Clear to auscultation without rales, wheezing or rhonchi  ABDOMEN: Soft, non-tender, non-distended EXTREMITIES:  No edema; No deformity    Wt Readings from Last 3 Encounters:  01/15/24 50.9 kg  01/02/24 50.7 kg  06/25/23 50.3 kg    EKG today demonstrates Atypical atrial flutter with variable block Vent. rate 114 BPM PR interval * ms QRS duration 66 ms QT/QTcB 338/465 ms   Echo 07/23/22 demonstrated   1. Left ventricular ejection fraction, by estimation, is 55 to 60%. The  left ventricle has normal function. The left ventricle has no regional  wall motion abnormalities. Left ventricular diastolic parameters are  consistent with Grade II diastolic dysfunction (pseudonormalization).   2. Right ventricular systolic function is normal. The right ventricular  size is normal. There is normal pulmonary artery  systolic pressure.   3. The mitral valve is normal in structure. Mild mitral valve  regurgitation. No evidence of mitral stenosis. There is mild prolapse of  anterior leaflet of the mitral valve.   4. Tricuspid valve regurgitation is mild to moderate.   5. The aortic valve is tricuspid. Aortic valve regurgitation is mild. No  aortic stenosis is present.   6. The inferior vena cava is normal in size with greater than 50%  respiratory variability, suggesting right atrial pressure of 3 mmHg.   Epic records are reviewed at length today  CHA2DS2-VASc Score = 4  The patient's score is based upon: CHF History: 1 HTN History: 0 Diabetes History: 0 Stroke History: 0 Vascular Disease History: 1 Age Score: 1 Gender Score: 1       ASSESSMENT AND PLAN: Paroxysmal Atrial Fibrillation/atrial flutter The patient's CHA2DS2-VASc score is 4, indicating a 4.8% annual risk of stroke.   S/p dofetilide  loading 01/2022 Patient appears to be failing dofetilide . We discussed rhythm control options. Recall she previously failed amiodarone  due to abnormal thyroid  and lung function tests. She  was seen by Dr Marven Slimmer and felt to not be an ablation candidate due to a chronic pericardial effusion. Her last echocardiogram in 2023 did not show any effusion, question if she would be a candidate now. Patient interested in discussing further with Dr Marven Slimmer, will refer.  Continue dofetilide  250 mcg BID Continue Eliquis  5 mg BID Continue Toprol  25 mg BID   Secondary Hypercoagulable State (ICD10:  D68.69) The patient is at significant risk for stroke/thromboembolism based upon her CHA2DS2-VASc Score of 4.  Continue Apixaban  (Eliquis ). No bleeding issues.   High Risk Medication Monitoring (ICD 10: Z79.899) QT interval on ECG acceptable for dofetilide  monitoring.   HFrecEF EF 55-60% GDMT per primary cardiology team Fluid status appears stable today    Follow up with Dr Marven Slimmer to discuss rhythm control.     Myrtha Ates PA-C [facility] [address] [place], [place] [address] [phone]

## 2024-01-20 ENCOUNTER — Encounter: Payer: Self-pay | Admitting: Cardiology

## 2024-01-20 DIAGNOSIS — I4819 Other persistent atrial fibrillation: Secondary | ICD-10-CM

## 2024-01-21 NOTE — Progress Notes (Signed)
 PATIENT: [name] DOB: November 21, 1945  REASON FOR VISIT: follow up HISTORY FROM: patient Chief Complaint  Patient presents with   Follow-up    Pt in 5 alone Pt here for MS f/u Pt states no questions or concerns for today's visit     HISTORY OF PRESENT ILLNESS: Today 01/22/24:  [name] is a 78 y.o. female with a history of migraine headaches and multiple sclerosis. Returns today for follow-up.  Patient reports in regards to her migraines they have continue to be stable.  She uses Imitrex  for abortive therapy although reports that she has not had to use it recently.  Patient has a history of MS.  She has not been on any disease modifying therapy in quite some time.  Overall she feels that her symptoms have remained stable.  No changes in her gait or balance.  No changes with her vision.  No change with the bowels or bladder.  No new numbness or weakness.  Last imaging was done in April 2022 which was unremarkable.   01/16/23:  [name] is a 78 y.o. female with a history of migraine headaches and multiple sclerosis. Returns today for follow-up.   Migraines: Overall migraines have been stable.  Continues to use Imitrex  for abortive therapy  MS: patient has not been on disease modifying therapy in quite some time.  She denies any new symptoms.  No changes with her gait or balance.  No changes with her vision.  No changes with the bowels or bladder.  No new numbness or weakness.     01/15/22:  [name] is a 78 year old female with a history of migraine headaches and mulitple sclerosis.  She returns today for follow-up.  MS: No DMT, reports symptoms are stable.  Denies any new numbness or weakness.  Denies any changes with her gait or balance.  No changes with her vision.  No changes with the bowels or bladder.  Migraine headaches: last migraine was in [place]. Uses imitrex - it resolves the headache typically.   01/11/21: [name] is a  78 year old female with a history of migraine headaches and multiple sclerosis.  She returns today for follow-up.  The patient is no longer on any disease modifying therapy.  She states overall she has been stable.  Had repeat imaging of the brain in April that was stable.  She states that she has noticed some tingling in the left hand and on the right side of the scalp in the temporal region.  She states that this occurs on occasion but is not consistent.  She denies any changes with her gait or balance.  No changes with the bowels or bladder.  No changes with her vision.  She states that her migraines have been stable.  She states that they typically wake her up out of sleep and this is new for her.  She states that she has not had a migraine in 2 months.  She continues to use Imitrex  if needed.  HISTORY (Copied from Dr.Dohmeier's note) [name], a 78 year old retired , married female returns for followup. She was last seen 07/03/15 and has been followed for multiple sclerosis since October 2008.  She has had 2 migraines in the past month, worse during allergy season  She is  on Imitrex  with good results. She does not want to be on a preventative medication . She has associated photophobia, she used to have a visual aura of  Zig zag lines and lights ,  now it is more of a tunnel vision arising from the periphery centrally with tiny little light dots.The headaches last with a pounding throbbing character about 3 hours up to a whole day . She is fatigued afterwards. She is highly sensitive to scents and smells.   She is currently on Rebif  tolerating the medication without significant injection reactions. She does not pretreat.  She denies spasms, focal weakness, sensory changes, visual changes, speech or swallowing problems, no problems with bowel or bladder function. She has not fallen, she does not use an assistive device.    Last MRI of the brain 07/02/13 read as abnormal - MRI scan of brain showing  multilple periventricular and supratentorial white matter lesions consistent with multiple sclerosis. No enhancing lesions are noted. The presence of T 1 black holes indicates chronic disease. Overall mild increase in white matter lesion load compared to MRI 05/24/2007. She returns for reevaluation  REVIEW OF SYSTEMS: Out of a complete 14 system review of symptoms, the patient complains only of the following symptoms, and all other reviewed systems are negative.  See HPI  ALLERGIES: Allergies  Allergen Reactions   Penicillins Rash    As a child. Has patient had a PCN reaction causing immediate rash, facial/tongue/throat swelling, SOB or lightheadedness with hypotension: No Has patient had a PCN reaction causing severe rash involving mucus membranes or skin necrosis: No Has patient had a PCN reaction that required hospitalization: No Has patient had a PCN reaction occurring within the last 10 years: No If all of the above answers are NO, then may proceed with Cephalosporin use.    Codeine Nausea And Vomiting    HOME MEDICATIONS: Outpatient Medications Prior to Visit  Medication Sig Dispense Refill   acetaminophen  (TYLENOL ) 500 MG tablet Take 500-1,000 mg by mouth every 6 (six) hours as needed for moderate pain or headache.     apixaban  (ELIQUIS ) 5 MG TABS tablet Take 1 tablet (5 mg total) by mouth 2 (two) times daily. 60 tablet 11   b complex vitamins capsule Take 1 capsule by mouth daily.     Biotin 16109 MCG TABS Take 10,000 mcg by mouth daily.     Calcium Carb-Cholecalciferol  (CALCIUM 500/VITAMIN D PO) Take 2 each by mouth daily. Chewables     Cholecalciferol  (VITAMIN D) 50 MCG (2000 UT) tablet Take 2,000 Units by mouth daily.     dofetilide  (TIKOSYN ) 250 MCG capsule TAKE 1 CAPSULE BY MOUTH TWICE  DAILY 180 capsule 1   latanoprost  (XALATAN ) 0.005 % ophthalmic solution Place 1 drop into both eyes at bedtime.  6   levothyroxine  (SYNTHROID ) 88 MCG tablet Take 88 mcg by mouth daily  before breakfast.     magnesium  oxide (MAG-OX) 400 MG tablet Take 400 mg by mouth daily.     metoprolol  succinate (TOPROL  XL) 25 MG 24 hr tablet Take 1 tablet (25 mg total) by mouth in the morning and at bedtime. 60 tablet 3   Misc Natural Products (GLUCOSAMINE CHOND COMPLEX/MSM PO) Take 1 tablet by mouth daily. Replenex     potassium chloride  SA (KLOR-CON  M20) 20 MEQ tablet TAKE 1 TABLET (20 MEQ TOTAL) BY MOUTH 2 (TWO) TIMES DAILY. TAKE ONE TABLET BY MOUTH ONCE DAILY 180 tablet 2   SUMAtriptan  (IMITREX ) 100 MG tablet TAKE 1 TABLET BY MOUTH AS NEEDED FOR HEADACHE MAY REPEAT IN 2 HOURS IF HEADACHE PERSIST 10 tablet 3   Vitamin A 2400 MCG (8000 UT) CAPS Take 8,000 Units by mouth 3 (three) times a  week.     zinc gluconate 50 MG tablet Take 50 mg by mouth 3 (three) times a week.     losartan  (COZAAR ) 25 MG tablet Take 1 tablet (25 mg total) by mouth daily. (Patient not taking: Reported on 01/22/2024) 30 tablet 11   Prenatal Vit-Fe Fumarate-FA (PRENATAL MULTIVITAMIN) TABS tablet Take 1 tablet by mouth daily.     No facility-administered medications prior to visit.    PAST MEDICAL HISTORY: Past Medical History:  Diagnosis Date   A-fib (HCC)    Dilated cardiomyopathy (HCC) 05/28/2021   NON-ISCHEMIC -> with restoration of sinus rhythm, EF improved from 25-30% up to 40 and 45%.  Moderate-severe TR noted   Diverticulosis 2012   With intermittent diverticulitis   Endometrial cancer (HCC) 2009   S/p vaginal hysterectomy   Family history of adverse reaction to anesthesia    mother ponv   Glaucoma    Hypothyroidism due to thyroiditis treated with radioactive iodine    Idiopathic pericardial effusion 05/16/2021   Migraine    Mitral valve regurgitation due to prolapse of cusp 05/16/2021   Echo: Moderate MR with holosystolic prolapse of the posterior MV   Multiple sclerosis (HCC) 2008   Well-controlled, currently not on medications.  Repatha subcu 3 times weekly from 2008-2021.  No further relapse.    Persistent atrial fibrillation (HCC) 05/02/2021    PAST SURGICAL HISTORY: Past Surgical History:  Procedure Laterality Date   14-day Zio Patch Monitor  05/2021   October 2022 100% A. fib rates ranging from 54 to 178 bpm.  Rare PVCs with occasional couplets.   Cardiac MRI  10/27/2021   Normal LV size and function-EF 57%.  Normal RV size and function-EF 56%.  No evidence of myocardial infarction.  Moderate pericardial effusion noted.   cataracts Bilateral    COLONOSCOPY WITH PROPOFOL  N/A 12/11/2015   Procedure: COLONOSCOPY WITH PROPOFOL ;  Surgeon: Garrett Kallman, MD;  Location: [facility];  Service: Endoscopy;  Laterality: N/A;   RIGHT/LEFT HEART CATH AND CORONARY ANGIOGRAPHY Bilateral 06/14/2021   Procedure: RIGHT/LEFT HEART CATH AND CORONARY ANGIOGRAPHY;  Surgeon: Arleen Lacer, MD;  Location: [facility] INVASIVE CV [facility];  Service: Cardiovascular; Angiographic normal coronary arteries.  Cardiac output-index (3.2-2.1). RAP 13/15-12 mmHg, RVP 27/3/10 mmHg; PAP-mean 30/18/23 mmHg, PCWP mean 20 mmHg.  LVEDP-EDP 122/11 mmHg-21 mmHg   salzman nodules  12/11/2014   Had one eye then the other on 01/2015   TONSILLECTOMY     TRANSTHORACIC ECHOCARDIOGRAM  05/16/2021   Severely reduced LV function-EF of 25 to 30%.  Unable assess diastolic dysfunction due to A. fib.  Mild to moderate LA dilation.  Moderate pericardial effusion.  Moderate MR with holosystolic prolapse of the posterior MV.  Normal RVP and PAP   TRANSTHORACIC ECHOCARDIOGRAM  08/30/2021   EF 40 to 45%.  No R WMA.  Moderate pericardial effusion. Mild MR and AI.  Moderate-severe TR. => Notable improvement from previous EF of 25 to 30%.   TRANSTHORACIC ECHOCARDIOGRAM  07/23/2022   EF improved to 55-60%.  No RWMA.  GR 2 DD remains.  Normal RV with normal RVSP and RAP.  Mild MVP of anterior leaflet with mild MR.  Mild Ao valve Sclerosis but no stenosis.  No pericardial effusion (EF improved from 40 to 45% with global HK, moderate to severe TR no  longer present and mild MR no longer present.  Moderate circumflex pericardial effusion normal present)   VAGINAL HYSTERECTOMY     complete    FAMILY HISTORY:  Family History  Problem Relation Age of Onset   Stroke Mother 61       lived 4 yrs   Congestive Heart Failure Mother 33   Fainting Father    Pneumonia Father        Thought to be aspiration pneumonia following an episode of syncope.   Migraines Father    Migraines Sister    Migraines Daughter    Multiple sclerosis Cousin    Migraines Grandson    Migraines Granddaughter    Multiple sclerosis Other     SOCIAL HISTORY: Social History   Socioeconomic History   Marital status: Married    Spouse name: Public house manager   Number of children: 1   Years of education: 14   Highest education level: Not on file  Occupational History    Employer: [employer]  Tobacco Use   Smoking status: Never   Smokeless tobacco: Never   Tobacco comments:    Never smoke 01/22/22  Vaping Use   Vaping status: Never Used  Substance and Sexual Activity   Alcohol use: Not on file    Comment: wine on occ   Drug use: No   Sexual activity: Not on file  Other Topics Concern   Not on file  Social History Narrative   Patient is married Development worker, international aid) and lives with his husband.   Patient has one child -daughter.>    Patient is retired.   Patient has a college education.   Patient is right-handed.   Patient drinks very little caffeine.   Exercise at the gym 2 to 3 days a week, walks routinely and does intermittent weights.   Social Drivers of Corporate investment banker Strain: Not on file  Food Insecurity: Not on file  Transportation Needs: Not on file  Physical Activity: Not on file  Stress: Not on file  Social Connections: Not on file  Intimate Partner Violence: Not on file      PHYSICAL EXAM  Vitals:   01/22/24 1050  Weight: 111 lb 6.4 oz (50.5 kg)  Height: 5' 4 (1.626 m)    Body mass index is 19.12 kg/m.  Generalized: Well developed, in  no acute distress   Neurological examination  Mentation: Alert oriented to time, place, history taking. Follows all commands speech and language fluent Cranial nerve II-XII: Pupils were equal round reactive to light. Extraocular movements were full, visual field were full on confrontational test. Facial sensation and strength were normal. Head turning and shoulder shrug  were normal and symmetric. Motor: The motor testing reveals 5 over 5 strength of all 4 extremities. Good symmetric motor tone is noted throughout.  Sensory: Sensory testing is intact to soft touch on all 4 extremities. No evidence of extinction is noted.  Coordination: Cerebellar testing reveals good finger-nose-finger and heel-to-shin bilaterally.  Gait and station: Gait is normal.  Reflexes: Deep tendon reflexes are symmetric and normal bilaterally.   DIAGNOSTIC DATA (LABS, IMAGING, TESTING) - I reviewed patient records, labs, notes, testing and imaging myself where available.  Lab Results  Component Value Date   WBC 7.2 01/02/2024   HGB 14.3 01/02/2024   HCT 44.1 01/02/2024   MCV 100 (H) 01/02/2024   PLT 246 01/02/2024      Component Value Date/Time   NA 141 01/02/2024 1119   K 4.2 01/02/2024 1119   CL 101 01/02/2024 1119   CO2 20 01/02/2024 1119   GLUCOSE 105 (H) 01/02/2024 1119   GLUCOSE 114 (H) 06/25/2023 1425   BUN 19  01/02/2024 1119   CREATININE 0.78 01/02/2024 1119   CALCIUM 10.0 01/02/2024 1119   PROT 6.8 09/07/2021 0914   ALBUMIN  4.4 09/07/2021 0914   AST 25 09/07/2021 0914   ALT 15 09/07/2021 0914   ALKPHOS 116 09/07/2021 0914   BILITOT 0.4 09/07/2021 0914   GFRNONAA >60 06/25/2023 1425   GFRAA 93 06/08/2020 1404   Lab Results  Component Value Date   CHOL 212 (H) 09/07/2021   HDL 93 09/07/2021   LDLCALC 109 (H) 09/07/2021   TRIG 54 09/07/2021   CHOLHDL 2.3 09/07/2021      ASSESSMENT AND PLAN 78 y.o. year old female  has a past medical history of A-fib (HCC), Dilated cardiomyopathy  (HCC) (05/28/2021), Diverticulosis (2012), Endometrial cancer (HCC) (2009), Family history of adverse reaction to anesthesia, Glaucoma, Hypothyroidism due to thyroiditis treated with radioactive iodine, Idiopathic pericardial effusion (05/16/2021), Migraine, Mitral valve regurgitation due to prolapse of cusp (05/16/2021), Multiple sclerosis (HCC) (2008), and Persistent atrial fibrillation (HCC) (05/02/2021). here with:  1.  Multiple sclerosis  -- Continue to monitor symptoms -- We discussed repeating imaging however the patient deferred for now since her symptoms have remained stable  2.  Migraine headaches  -- Continue Imitrex  as abortive therapy --Denies any new cardiac history other than A-fib -- Advised if headache frequency or severity increases she should let us  know  --Follow-up in 1 year or sooner if needed  Clem Currier, MSN, NP-C 01/22/2024, 10:53 AM [facility] [address], [address] [place], [place] [address] [phone]

## 2024-01-21 NOTE — Telephone Encounter (Signed)
 From last 01/15/24 OV note with Clint Fenton, PA:  She was seen by Dr Marven Slimmer and felt to not be an ablation candidate due to a chronic pericardial effusion. Her last echocardiogram in 2023 did not show any effusion, question if she would be a candidate now. Patient interested in discussing further with Dr Marven Slimmer, will refer.   Order placed for referral to Dr Marven Slimmer for Ablation Consult.

## 2024-01-22 ENCOUNTER — Encounter: Payer: Self-pay | Admitting: Adult Health

## 2024-01-22 ENCOUNTER — Ambulatory Visit: Payer: Medicare Other | Admitting: Adult Health

## 2024-01-22 VITALS — Ht 64.0 in | Wt 111.4 lb

## 2024-01-22 DIAGNOSIS — G43009 Migraine without aura, not intractable, without status migrainosus: Secondary | ICD-10-CM | POA: Diagnosis not present

## 2024-01-22 DIAGNOSIS — G35 Multiple sclerosis: Secondary | ICD-10-CM

## 2024-01-22 MED ORDER — SUMATRIPTAN SUCCINATE 100 MG PO TABS: ORAL_TABLET | ORAL | 3 refills | Status: AC

## 2024-01-23 ENCOUNTER — Encounter: Payer: Self-pay | Admitting: Cardiology

## 2024-01-24 NOTE — Telephone Encounter (Signed)
 I do not think I ordered labs, but it may have been ordered by you in A-fib clinic or EP.   Randene Bustard, MD

## 2024-01-27 NOTE — Telephone Encounter (Signed)
 Called and spoke to patient . She stated she got the issue all figured out and thanked Charity fundraiser for calling.She sated it was call about a lab that had been cancelled prior  from AFib Clinic

## 2024-01-29 ENCOUNTER — Other Ambulatory Visit: Payer: Self-pay | Admitting: Cardiology

## 2024-01-29 NOTE — Progress Notes (Unsigned)
 Electrophysiology Office Follow up Visit Note:    Date:  01/30/2024   ID:  [name], DOB [birth year], MRN [id number]  PCP:  Arva Lathe, MD  [facility] Cardiologist:  Randene Bustard, MD  [facility] Electrophysiologist:  Boyce Byes, MD    Interval History:     [name] is a 78 y.o. female who presents for a follow up visit.   I last saw the patient June 25, 2022.  She has a long history of atrial fibrillation on Tikosyn .  She also takes Eliquis  for stroke prophylaxis.  She has chronic systolic heart failure.  She saw a [place] in the A-fib clinic January 15, 2024.  She was noted to be back in atrial fibrillation at an appointment with Dr. Addie Holstein on Jan 02, 2024.  She was scheduled for cardioversion but presented to the hospital in January 14, 2024 in sinus rhythm.  She saw Willard Harman she was in atypical atrial flutter on June 5.  She previously failed amiodarone  because of abnormal thyroid  and lung function studies.  Ablation was considered in the past but not pursued because of a pericardial effusion.  An echo most recently in 2023 did not show any significant effusion.       Past medical, surgical, social and family history were reviewed.  ROS:   Please see the history of present illness.    All other systems reviewed and are negative.  EKGs/Labs/Other Studies Reviewed:    The following studies were reviewed today:  July 23, 2022 echo personally reviewed No significant pericardial effusion       Physical Exam:    VS:  BP 100/62 (BP Location: Left Arm, Patient Position: Sitting, Cuff Size: Normal)   Pulse (!) 50   Ht 5' 4 (1.626 m)   Wt 111 lb 12.8 oz (50.7 kg)   SpO2 97%   BMI 19.19 kg/m     Wt Readings from Last 3 Encounters:  01/30/24 111 lb 12.8 oz (50.7 kg)  01/22/24 111 lb 6.4 oz (50.5 kg)  01/15/24 112 lb 3.2 oz (50.9 kg)     GEN: no distress CARD: RRR, No MRG RESP: No IWOB. CTAB.      ASSESSMENT:     1. Atypical atrial flutter (HCC)   2. Persistent atrial fibrillation (HCC)   3. Encounter for long-term (current) use of high-risk medication    PLAN:    In order of problems listed above:  #Persistent atrial fibrillation #High risk med monitoring-Tikosyn  The patient has suboptimal control of her atrial fibrillation/flutter with Tikosyn .  We discussed treatment options including continuing Tikosyn  versus pursuing catheter ablation.  The patient was previously taken for catheter ablation but the procedure was aborted because of a pericardial effusion.  I do not see evidence of that on most recent echo from 2023.  We discussed the options during today's clinic appointment in detail.  I think it would be reasonable to take her back for an ablation attempt.  She understands that if there is a significant effusion on intracardiac echo after she is off to sleep we will abort the procedure again.  If no significant effusion, we will plan to proceed with a pulse field ablation.  Discussed treatment options today for AF including antiarrhythmic drug therapy and ablation. Discussed risks, recovery and likelihood of success with each treatment strategy. Risk, benefits, and alternatives to EP study and ablation for afib were discussed. These risks include but are not limited to stroke, bleeding, vascular damage, tamponade,  perforation, damage to the esophagus, lungs, phrenic nerve and other structures, pulmonary vein stenosis, worsening renal function, coronary vasospasm and death.  Discussed potential need for repeat ablation procedures and antiarrhythmic drugs after an initial ablation. The patient understands these risk and wishes to proceed.  We will therefore proceed with catheter ablation at the next available time.  Carto, ICE, anesthesia are requested for the procedure.  Will also obtain CT PV protocol prior to the procedure to exclude LAA thrombus and further evaluate atrial anatomy.  QTc acceptable  for ongoing Tikosyn  use.  Continue Eliquis    Signed, Harvie Liner, MD, [facility], [facility] 01/30/2024 3:18 PM    Electrophysiology [facility]

## 2024-01-30 ENCOUNTER — Ambulatory Visit: Attending: Cardiology | Admitting: Cardiology

## 2024-01-30 ENCOUNTER — Encounter: Payer: Self-pay | Admitting: Cardiology

## 2024-01-30 VITALS — BP 100/62 | HR 50 | Ht 64.0 in | Wt 111.8 lb

## 2024-01-30 DIAGNOSIS — Z79899 Other long term (current) drug therapy: Secondary | ICD-10-CM

## 2024-01-30 DIAGNOSIS — Z01812 Encounter for preprocedural laboratory examination: Secondary | ICD-10-CM

## 2024-01-30 DIAGNOSIS — I484 Atypical atrial flutter: Secondary | ICD-10-CM | POA: Diagnosis not present

## 2024-01-30 DIAGNOSIS — I4819 Other persistent atrial fibrillation: Secondary | ICD-10-CM | POA: Diagnosis not present

## 2024-01-30 NOTE — Patient Instructions (Addendum)
 Medication Instructions:  Your physician recommends that you continue on your current medications as directed. Please refer to the Current Medication list given to you today.  *If you need a refill on your cardiac medications before your next appointment, please call your pharmacy*  Lab Work: CBC and BMET - please have pre-procedure lab work completed on Wednesday  03/24/2024 . This can be done at ANY [facility] near you - no appointment required and this does not have to be fasting. If you have labs (blood work) drawn today and your tests are completely normal, you will receive your results only by: MyChart Message (if you have MyChart) OR A paper copy in the mail If you have any lab test that is abnormal or we need to change your treatment, we will call you to review the results.  Testing/Procedures: Cardiac CT - someone will contact you to schedule this  Your physician has requested that you have cardiac CT. Cardiac computed tomography (CT) is a painless test that uses an x-ray machine to take clear, detailed pictures of your heart. For further information please visit [email or web address] Please follow instruction sheet as given.   Atrial Fibrillation Ablation - scheduled on Tuesday 09/09/202 We will be in contact closer to your ablation date with further instructions Your physician has recommended that you have an ablation. Catheter ablation is a medical procedure used to treat some cardiac arrhythmias (irregular heartbeats). During catheter ablation, a long, thin, flexible tube is put into a blood vessel in your groin (upper thigh), or neck. This tube is called an ablation catheter. It is then guided to your heart through the blood vessel. Radio frequency waves destroy small areas of heart tissue where abnormal heartbeats may cause an arrhythmia to start. Please see the instruction sheet given to you today.  Follow-Up: At [facility], you and your health needs are our priority.  As  part of our continuing mission to provide you with exceptional heart care, our providers are all part of one team.  This team includes your primary Cardiologist (physician) and Advanced Practice Providers or APPs (Physician Assistants and Nurse Practitioners) who all work together to provide you with the care you need, when you need it.  Your next appointment:   We will schedule follow up after your ablation  Provider:   Dr Marven Slimmer   Cardiac Ablation Cardiac ablation is a procedure to destroy, or ablate, a small amount of heart tissue that is causing problems. The heart has many electrical connections. Sometimes, these connections are abnormal and can cause the heart to beat very fast or irregularly. Ablating the abnormal areas can improve the heart's rhythm or return it to normal. Ablation may be done for people who: Have irregular or rapid heartbeats (arrhythmias). Have Wolff-Parkinson-White syndrome. Have taken medicines for an arrhythmia that did not work or caused side effects. Have a high-risk heartbeat that may be life-threatening. Tell a health care provider about: Any allergies you have. All medicines you are taking, including vitamins, herbs, eye drops, creams, and over-the-counter medicines. Any problems you or family members have had with anesthesia. Any bleeding problems you have. Any surgeries you have had. Any medical conditions you have. Whether you are pregnant or may be pregnant. What are the risks? Your health care provider will talk with you about risks. These may include: Infection. Bruising and bleeding. Stroke or blood clots. Damage to nearby structures or organs. Allergic reaction to medicines or dyes. Needing a pacemaker if the heart gets damaged. A  pacemaker is a device that helps the heart beat normally. Failure of the procedure. A repeat procedure may be needed. What happens before the procedure? Medicines Ask your health care provider about: Changing or  stopping your regular medicines. These include any heart rhythm medicines, diabetes medicines, or blood thinners you take. Taking medicines such as aspirin  and ibuprofen. These medicines can thin your blood. Do not take them unless your health care provider tells you to. Taking over-the-counter medicines, vitamins, herbs, and supplements. General instructions Follow instructions from your health care provider about what you may eat and drink. If you will be going home right after the procedure, plan to have a responsible adult: Take you home from the hospital or clinic. You will not be allowed to drive. Care for you for the time you are told. Ask your health care provider what steps will be taken to prevent infection. What happens during the procedure?  An IV will be inserted into one of your veins. You may be given: A sedative. This helps you relax. Anesthesia. This will: Numb certain areas of your body. An incision will be made in your neck or your groin. A needle will be inserted through the incision and into a large vein in your neck or groin. The small, thin tube (catheter) will be inserted through the needle and moved to your heart. A type of X-ray (fluoroscopy) will be used to help guide the catheter and provide images of the heart on a monitor. Dye may be injected through the catheter to help your surgeon see the area of the heart that needs treatment. Electrical currents will be sent from the catheter to destroy heart tissue in certain areas. There are three types of energy that may be used to do this: Heat (radiofrequency energy). Laser energy. Extreme cold (cryoablation). When the tissue has been destroyed, the catheter will be removed. Pressure will be held on the insertion area to prevent bleeding. A bandage (dressing) will be placed over the insertion area. The procedure may vary among health care providers and hospitals. What happens after the procedure? Your blood  pressure, heart rate and rhythm, breathing rate, and blood oxygen level will be monitored until you leave the hospital or clinic. Your insertion area will be checked for bleeding. You will need to lie still for a few hours. If your groin was used, you will need to keep your leg straight for a few hours after the catheter is removed. This information is not intended to replace advice given to you by your health care provider. Make sure you discuss any questions you have with your health care provider. Document Revised: 01/15/2022 Document Reviewed: 01/15/2022 Elsevier Patient Education  2024 [employer].

## 2024-01-30 NOTE — Addendum Note (Signed)
 Addended by: Arva Lathe on: 01/30/2024 04:09 PM   Modules accepted: Orders

## 2024-02-06 ENCOUNTER — Other Ambulatory Visit: Payer: Self-pay

## 2024-02-06 DIAGNOSIS — I4819 Other persistent atrial fibrillation: Secondary | ICD-10-CM

## 2024-02-06 DIAGNOSIS — Z79899 Other long term (current) drug therapy: Secondary | ICD-10-CM

## 2024-02-06 DIAGNOSIS — Z01812 Encounter for preprocedural laboratory examination: Secondary | ICD-10-CM

## 2024-02-06 NOTE — Addendum Note (Signed)
 Addended by: CASIMIR ALDONA BRAVO on: 02/06/2024 02:36 PM   Modules accepted: Orders

## 2024-02-11 ENCOUNTER — Ambulatory Visit (HOSPITAL_COMMUNITY)

## 2024-03-16 LAB — CBC
Hematocrit: 42.2 % (ref 34.0–46.6)
Hemoglobin: 13.7 g/dL (ref 11.1–15.9)
MCH: 32.3 pg (ref 26.6–33.0)
MCHC: 32.5 g/dL (ref 31.5–35.7)
MCV: 100 fL — ABNORMAL HIGH (ref 79–97)
Platelets: 216 x10E3/uL (ref 150–450)
RBC: 4.24 x10E6/uL (ref 3.77–5.28)
RDW: 12.4 % (ref 11.7–15.4)
WBC: 5.2 x10E3/uL (ref 3.4–10.8)

## 2024-03-17 LAB — BASIC METABOLIC PANEL WITH GFR
BUN/Creatinine Ratio: 20 (ref 12–28)
BUN: 16 mg/dL (ref 8–27)
CO2: 23 mmol/L (ref 20–29)
Calcium: 9.8 mg/dL (ref 8.7–10.3)
Chloride: 101 mmol/L (ref 96–106)
Creatinine, Ser: 0.79 mg/dL (ref 0.57–1.00)
Glucose: 85 mg/dL (ref 70–99)
Potassium: 4 mmol/L (ref 3.5–5.2)
Sodium: 139 mmol/L (ref 134–144)
eGFR: 78 mL/min/1.73 (ref >59)

## 2024-03-24 ENCOUNTER — Telehealth: Payer: Self-pay

## 2024-03-24 NOTE — Telephone Encounter (Signed)
 Called pt and went over CT/Ablation instructions. She is aware of dates/times and where to go. She understands all medication instructions and has no further questions at this time.   I have sent her Instruction letters via MyChart - She will review them and call back with any questions or concerns.

## 2024-03-25 ENCOUNTER — Telehealth: Payer: Self-pay

## 2024-03-25 NOTE — Telephone Encounter (Signed)
 Pt aware of her procedure time change on 9/9 for her Afib Ablation with Dr. Cindie.  She will arrive at 8:00 am for a 10:00 am procedure time.

## 2024-03-30 ENCOUNTER — Ambulatory Visit (HOSPITAL_COMMUNITY)
Admission: RE | Admit: 2024-03-30 | Discharge: 2024-03-30 | Disposition: A | Discharge status: Home / Self Care | Source: Ambulatory Visit | Attending: Cardiology | Admitting: Cardiology

## 2024-03-30 ENCOUNTER — Other Ambulatory Visit: Payer: Self-pay

## 2024-03-30 DIAGNOSIS — I4819 Other persistent atrial fibrillation: Secondary | ICD-10-CM

## 2024-03-30 DIAGNOSIS — I700 Atherosclerosis of aorta: Secondary | ICD-10-CM | POA: Insufficient documentation

## 2024-03-30 MED ORDER — IOHEXOL 350 MG/ML SOLN
80.0000 mL | Freq: Once | INTRAVENOUS | Status: AC | PRN
  Administered 2024-03-30: 80 mL via INTRAVENOUS

## 2024-03-31 LAB — CBC
Hematocrit: 40 % (ref 34.0–46.6)
Hemoglobin: 13 g/dL (ref 11.1–15.9)
MCH: 32.3 pg (ref 26.6–33.0)
MCHC: 32.5 g/dL (ref 31.5–35.7)
MCV: 99 fL — ABNORMAL HIGH (ref 79–97)
Platelets: 205 x10E3/uL (ref 150–450)
RBC: 4.03 x10E6/uL (ref 3.77–5.28)
RDW: 12.4 % (ref 11.7–15.4)
WBC: 4.8 x10E3/uL (ref 3.4–10.8)

## 2024-03-31 LAB — BASIC METABOLIC PANEL WITH GFR
BUN/Creatinine Ratio: 18 (ref 12–28)
BUN: 14 mg/dL (ref 8–27)
CO2: 23 mmol/L (ref 20–29)
Calcium: 9.5 mg/dL (ref 8.7–10.3)
Chloride: 101 mmol/L (ref 96–106)
Creatinine, Ser: 0.76 mg/dL (ref 0.57–1.00)
Glucose: 72 mg/dL (ref 70–99)
Potassium: 4 mmol/L (ref 3.5–5.2)
Sodium: 139 mmol/L (ref 134–144)
eGFR: 82 mL/min/1.73 (ref >59)

## 2024-04-09 ENCOUNTER — Other Ambulatory Visit (HOSPITAL_COMMUNITY): Payer: Self-pay | Admitting: Physician Assistant

## 2024-04-19 NOTE — Pre-Procedure Instructions (Signed)
 Instructed patient on the following items: Arrival time 0800 Nothing to eat or drink after midnight No meds AM of procedure Responsible person to drive you home and stay with you for 24 hrs  Have you missed any doses of anti-coagulant Eliquis- takes twice a day, hasn't missed any doses.  Don't take dose morning of procedure.

## 2024-04-20 ENCOUNTER — Ambulatory Visit (HOSPITAL_COMMUNITY)
Admission: RE | Admit: 2024-04-20 | Discharge: 2024-04-20 | Disposition: A | Discharge status: Home / Self Care | Attending: Cardiology | Admitting: Cardiology

## 2024-04-20 ENCOUNTER — Ambulatory Visit (HOSPITAL_COMMUNITY)
Admission: RE | Disposition: A | Discharge status: Home / Self Care | Payer: Self-pay | Source: Home / Self Care | Attending: Cardiology

## 2024-04-20 ENCOUNTER — Ambulatory Visit (HOSPITAL_COMMUNITY)

## 2024-04-20 ENCOUNTER — Other Ambulatory Visit: Payer: Self-pay

## 2024-04-20 ENCOUNTER — Other Ambulatory Visit (HOSPITAL_COMMUNITY): Payer: Self-pay

## 2024-04-20 DIAGNOSIS — E039 Hypothyroidism, unspecified: Secondary | ICD-10-CM | POA: Diagnosis not present

## 2024-04-20 DIAGNOSIS — Z79899 Other long term (current) drug therapy: Secondary | ICD-10-CM | POA: Diagnosis not present

## 2024-04-20 DIAGNOSIS — I4891 Unspecified atrial fibrillation: Secondary | ICD-10-CM

## 2024-04-20 DIAGNOSIS — I5022 Chronic systolic (congestive) heart failure: Secondary | ICD-10-CM | POA: Insufficient documentation

## 2024-04-20 DIAGNOSIS — I4819 Other persistent atrial fibrillation: Secondary | ICD-10-CM | POA: Diagnosis present

## 2024-04-20 DIAGNOSIS — Z7901 Long term (current) use of anticoagulants: Secondary | ICD-10-CM | POA: Insufficient documentation

## 2024-04-20 DIAGNOSIS — I484 Atypical atrial flutter: Secondary | ICD-10-CM | POA: Insufficient documentation

## 2024-04-20 LAB — POCT ACTIVATED CLOTTING TIME
Activated Clotting Time: 0 s
Activated Clotting Time: 291 s

## 2024-04-20 MED ORDER — COLCHICINE 0.6 MG PO TABS
0.6000 mg | ORAL_TABLET | Freq: Two times a day (BID) | ORAL | 0 refills | Status: DC
  Filled 2024-04-20: qty 10, 5d supply, fill #0

## 2024-04-20 MED ORDER — SODIUM CHLORIDE 0.9 % IV SOLN: 250.0000 mL | INTRAVENOUS | Status: DC | PRN

## 2024-04-20 MED ORDER — SODIUM CHLORIDE 0.9% FLUSH: 3.0000 mL | Freq: Two times a day (BID) | INTRAVENOUS | Status: DC

## 2024-04-20 MED ORDER — PANTOPRAZOLE SODIUM 40 MG PO TBEC
40.0000 mg | DELAYED_RELEASE_TABLET | Freq: Every day | ORAL | Status: DC
  Administered 2024-04-20: 40 mg via ORAL
  Filled 2024-04-20: qty 1

## 2024-04-20 MED ORDER — ONDANSETRON HCL 4 MG/2ML IJ SOLN: 4.0000 mg | Freq: Four times a day (QID) | INTRAMUSCULAR | Status: DC | PRN

## 2024-04-20 MED ORDER — HEPARIN (PORCINE) IN NACL 1000-0.9 UT/500ML-% IV SOLN
INTRAVENOUS | Status: DC | PRN
  Administered 2024-04-20 (×3): 500 mL

## 2024-04-20 MED ORDER — SUGAMMADEX SODIUM 200 MG/2ML IV SOLN
INTRAVENOUS | Status: DC | PRN
  Administered 2024-04-20: 200 mg via INTRAVENOUS

## 2024-04-20 MED ORDER — HEPARIN SODIUM (PORCINE) 1000 UNIT/ML IJ SOLN
INTRAMUSCULAR | Status: DC | PRN
  Administered 2024-04-20: 9000 [IU] via INTRAVENOUS
  Administered 2024-04-20: 1000 [IU] via INTRAVENOUS

## 2024-04-20 MED ORDER — ATROPINE SULFATE 1 MG/ML IV SOLN
INTRAVENOUS | Status: DC | PRN
  Administered 2024-04-20: 1 mg via INTRAVENOUS

## 2024-04-20 MED ORDER — LIDOCAINE 2% (20 MG/ML) 5 ML SYRINGE
INTRAMUSCULAR | Status: DC | PRN
  Administered 2024-04-20: 40 mg via INTRAVENOUS

## 2024-04-20 MED ORDER — ACETAMINOPHEN 325 MG PO TABS: 650.0000 mg | ORAL_TABLET | ORAL | Status: DC | PRN

## 2024-04-20 MED ORDER — APIXABAN 5 MG PO TABS
5.0000 mg | ORAL_TABLET | Freq: Two times a day (BID) | ORAL | Status: DC
  Administered 2024-04-20: 5 mg via ORAL
  Filled 2024-04-20: qty 1

## 2024-04-20 MED ORDER — DEXAMETHASONE SODIUM PHOSPHATE 10 MG/ML IJ SOLN
INTRAMUSCULAR | Status: DC | PRN
  Administered 2024-04-20: 10 mg via INTRAVENOUS

## 2024-04-20 MED ORDER — SODIUM CHLORIDE 0.9 % IV SOLN: INTRAVENOUS | Status: DC

## 2024-04-20 MED ORDER — PROTAMINE SULFATE 10 MG/ML IV SOLN
INTRAVENOUS | Status: DC | PRN
  Administered 2024-04-20: 40 mg via INTRAVENOUS

## 2024-04-20 MED ORDER — ROCURONIUM BROMIDE 10 MG/ML (PF) SYRINGE
PREFILLED_SYRINGE | INTRAVENOUS | Status: DC | PRN
  Administered 2024-04-20: 40 mg via INTRAVENOUS
  Administered 2024-04-20: 20 mg via INTRAVENOUS
  Administered 2024-04-20: 10 mg via INTRAVENOUS

## 2024-04-20 MED ORDER — SODIUM CHLORIDE 0.9% FLUSH: 3.0000 mL | INTRAVENOUS | Status: DC | PRN

## 2024-04-20 MED ORDER — PROPOFOL 10 MG/ML IV BOLUS
INTRAVENOUS | Status: DC | PRN
  Administered 2024-04-20: 20 mg via INTRAVENOUS
  Administered 2024-04-20: 80 mg via INTRAVENOUS
  Administered 2024-04-20 (×3): 40 mg via INTRAVENOUS

## 2024-04-20 MED ORDER — FENTANYL CITRATE (PF) 250 MCG/5ML IJ SOLN
INTRAMUSCULAR | Status: DC | PRN
  Administered 2024-04-20: 50 ug via INTRAVENOUS
  Administered 2024-04-20: 25 ug via INTRAVENOUS

## 2024-04-20 MED ORDER — ONDANSETRON HCL 4 MG/2ML IJ SOLN
INTRAMUSCULAR | Status: DC | PRN
Start: 2024-04-20 — End: 2024-04-20
  Administered 2024-04-20: 4 mg via INTRAVENOUS

## 2024-04-20 MED ORDER — FENTANYL CITRATE (PF) 100 MCG/2ML IJ SOLN
INTRAMUSCULAR | Status: AC
  Filled 2024-04-20: qty 2

## 2024-04-20 MED ORDER — PANTOPRAZOLE SODIUM 40 MG PO TBEC
40.0000 mg | DELAYED_RELEASE_TABLET | Freq: Every day | ORAL | 0 refills | Status: AC
  Filled 2024-04-20: qty 45, 45d supply, fill #0

## 2024-04-20 MED ORDER — PROPOFOL 500 MG/50ML IV EMUL
INTRAVENOUS | Status: DC | PRN
  Administered 2024-04-20: 80 ug/kg/min via INTRAVENOUS
  Administered 2024-04-20: 55 ug/kg/min via INTRAVENOUS

## 2024-04-20 MED ORDER — COLCHICINE 0.6 MG PO TABS
0.6000 mg | ORAL_TABLET | Freq: Two times a day (BID) | ORAL | Status: DC
  Administered 2024-04-20: 0.6 mg via ORAL
  Filled 2024-04-20: qty 1

## 2024-04-20 NOTE — Discharge Instructions (Signed)

## 2024-04-20 NOTE — Transfer of Care (Signed)
 Immediate Anesthesia Transfer of Care Note  Patient: [name]  Procedure(s) Performed: ATRIAL FIBRILLATION ABLATION  Patient Location: PACU and Cath Lab  Anesthesia Type:General  Level of Consciousness: awake, alert , and oriented  Airway & Oxygen Therapy: Patient Spontanous Breathing and Patient connected to nasal cannula oxygen  Post-op Assessment: Report given to RN and Post -op Vital signs reviewed and stable  Post vital signs: stable  Last Vitals:  Vitals Value Taken Time  BP 107/97 04/20/24 11:32  Temp    Pulse    Resp 20 04/20/24 11:34  SpO2    Vitals shown include unfiled device data.  Last Pain:  Vitals:   04/20/24 9177  TempSrc: Oral         Complications: No notable events documented.

## 2024-04-20 NOTE — Progress Notes (Signed)
 Patient ambulated to the bathroom. Bilateral groin sites intact. No bleeding or hematoma noted. Patient was able to void.

## 2024-04-20 NOTE — H&P (Signed)
 Electrophysiology Office Follow up Visit Note:     Date:  04/20/2024    ID:  [name], DOB 1946/05/07, MRN [id number]   PCP:  Elliot Charm, MD   [facility] Cardiologist:  Alm Clay, MD  [facility] Electrophysiologist:  OLE ONEIDA HOLTS, MD      Interval History:       [name] is a 78 y.o. female who presents for a follow up visit.    I last saw the patient June 25, 2022.  She has a long history of atrial fibrillation on Tikosyn .  She also takes Eliquis  for stroke prophylaxis.  She has chronic systolic heart failure.   She saw a [place] in the A-fib clinic January 15, 2024.  She was noted to be back in atrial fibrillation at an appointment with Dr. Clay on Jan 02, 2024.  She was scheduled for cardioversion but presented to the hospital in January 14, 2024 in sinus rhythm.  She saw Daril she was in atypical atrial flutter on June 5.   She previously failed amiodarone  because of abnormal thyroid  and lung function studies.  Ablation was considered in the past but not pursued because of a pericardial effusion.  An echo most recently in 2023 did not show any significant effusion.  Presents for AF ablation. Procedure reviewed.   Objective Past medical, surgical, social and family history were reviewed.   ROS:   Please see the history of present illness.    All other systems reviewed and are negative.   EKGs/Labs/Other Studies Reviewed:     The following studies were reviewed today:   July 23, 2022 echo personally reviewed No significant pericardial effusion         Physical Exam:     VS:  BP 129/77 (BP Location: Left Arm, Patient Position: Sitting, Cuff Size: Normal)   Pulse 70   Ht 5' 4 (1.626 m)   Wt 111 lb 12.8 oz (50.7 kg)   SpO2 97%   BMI 19.19 kg/m         Wt Readings from Last 3 Encounters:  01/30/24 111 lb 12.8 oz (50.7 kg)  01/22/24 111 lb 6.4 oz (50.5 kg)  01/15/24 112 lb 3.2 oz (50.9 kg)      GEN: no  distress CARD: RRR, No MRG RESP: No IWOB. CTAB.     Assessment ASSESSMENT:     1. Atypical atrial flutter (HCC)   2. Persistent atrial fibrillation (HCC)   3. Encounter for long-term (current) use of high-risk medication     PLAN:     In order of problems listed above:   #Persistent atrial fibrillation #High risk med monitoring-Tikosyn  The patient has suboptimal control of her atrial fibrillation/flutter with Tikosyn .  We discussed treatment options including continuing Tikosyn  versus pursuing catheter ablation.  The patient was previously taken for catheter ablation but the procedure was aborted because of a pericardial effusion.  I do not see evidence of that on most recent echo from 2023.  We discussed the options during today's clinic appointment in detail.  I think it would be reasonable to take her back for an ablation attempt.  She understands that if there is a significant effusion on intracardiac echo after she is off to sleep we will abort the procedure again.  If no significant effusion, we will plan to proceed with a pulse field ablation.   Discussed treatment options today for AF including antiarrhythmic drug therapy and ablation. Discussed risks, recovery and likelihood of  success with each treatment strategy. Risk, benefits, and alternatives to EP study and ablation for afib were discussed. These risks include but are not limited to stroke, bleeding, vascular damage, tamponade, perforation, damage to the esophagus, lungs, phrenic nerve and other structures, pulmonary vein stenosis, worsening renal function, coronary vasospasm and death.  Discussed potential need for repeat ablation procedures and antiarrhythmic drugs after an initial ablation. The patient understands these risk and wishes to proceed.  We will therefore proceed with catheter ablation at the next available time.  Carto, ICE, anesthesia are requested for the procedure.  Will also obtain CT PV protocol prior to the  procedure to exclude LAA thrombus and further evaluate atrial anatomy.   QTc acceptable for ongoing Tikosyn  use.   Continue Eliquis    Presents for AF ablation. Procedure reviewed.    Signed, Ole Holts, MD, [facility], [facility] 04/20/2024 Electrophysiology [facility]

## 2024-04-20 NOTE — Anesthesia Preprocedure Evaluation (Signed)
 Anesthesia Evaluation  Patient identified by MRN, date of birth, ID band Patient awake    Reviewed: Allergy & Precautions, NPO status , Patient's Chart, lab work & pertinent test results, reviewed documented beta blocker date and time   History of Anesthesia Complications Negative for: history of anesthetic complications  Airway Mallampati: III  TM Distance: <3 FB Neck ROM: Full    Dental  (+) Teeth Intact, Dental Advisory Given   Pulmonary neg shortness of breath, neg sleep apnea, neg COPD, neg recent URI   breath sounds clear to auscultation       Cardiovascular + dysrhythmias Atrial Fibrillation  Rhythm:Regular   1. Left ventricular ejection fraction, by estimation, is 55 to 60%. The  left ventricle has normal function. The left ventricle has no regional  wall motion abnormalities. Left ventricular diastolic parameters are  consistent with Grade II diastolic  dysfunction (pseudonormalization).   2. Right ventricular systolic function is normal. The right ventricular  size is normal. There is normal pulmonary artery systolic pressure.   3. The mitral valve is normal in structure. Mild mitral valve  regurgitation. No evidence of mitral stenosis. There is mild prolapse of  anterior leaflet of the mitral valve.   4. Tricuspid valve regurgitation is mild to moderate.   5. The aortic valve is tricuspid. Aortic valve regurgitation is mild. No  aortic stenosis is present.   6. The inferior vena cava is normal in size with greater than 50%  respiratory variability, suggesting right atrial pressure of 3 mmHg.     Neuro/Psych  Headaches, neg Seizures  negative psych ROS   GI/Hepatic negative GI ROS, Neg liver ROS,,,  Endo/Other  Hypothyroidism    Renal/GU negative Renal ROS     Musculoskeletal negative musculoskeletal ROS (+)    Abdominal   Peds  Hematology  (+) Blood dyscrasia eliquis    Anesthesia Other Findings    Reproductive/Obstetrics                              Anesthesia Physical Anesthesia Plan  ASA: 2  Anesthesia Plan: General   Post-op Pain Management: Minimal or no pain anticipated   Induction: Intravenous  PONV Risk Score and Plan: 3 and Propofol  infusion, TIVA, Ondansetron  and Dexamethasone   Airway Management Planned: Oral ETT  Additional Equipment: None  Intra-op Plan:   Post-operative Plan: Extubation in OR  Informed Consent: I have reviewed the patients History and Physical, chart, labs and discussed the procedure including the risks, benefits and alternatives for the proposed anesthesia with the patient or authorized representative who has indicated his/her understanding and acceptance.     Dental advisory given  Plan Discussed with: CRNA  Anesthesia Plan Comments:          Anesthesia Quick Evaluation

## 2024-04-20 NOTE — Anesthesia Procedure Notes (Signed)
 Procedure Name: Intubation Date/Time: 04/20/2024 10:04 AM  Performed by: Moishe Reyes CROME, CRNAPre-anesthesia Checklist: Patient identified, Emergency Drugs available, Suction available, Patient being monitored and Timeout performed Patient Re-evaluated:Patient Re-evaluated prior to induction Oxygen Delivery Method: Circle system utilized Preoxygenation: Pre-oxygenation with 100% oxygen Induction Type: IV induction Ventilation: Mask ventilation without difficulty Laryngoscope Size: McGrath and 3 Grade View: Grade II Tube size: 6.5 mm Number of attempts: 1 Airway Equipment and Method: Stylet and Video-laryngoscopy Placement Confirmation: ETT inserted through vocal cords under direct vision, positive ETCO2, breath sounds checked- equal and bilateral and CO2 detector Secured at: 20 cm Tube secured with: Tape Dental Injury: Teeth and Oropharynx as per pre-operative assessment

## 2024-04-21 ENCOUNTER — Telehealth (HOSPITAL_COMMUNITY): Payer: Self-pay

## 2024-04-21 ENCOUNTER — Encounter (HOSPITAL_COMMUNITY): Payer: Self-pay | Admitting: Cardiology

## 2024-04-21 MED FILL — Fentanyl Citrate Preservative Free (PF) Inj 100 MCG/2ML: INTRAMUSCULAR | Qty: 1.5 | Status: AC

## 2024-04-21 NOTE — Telephone Encounter (Signed)
 Spoke with patient to complete post procedure follow up call.  Patient reports no complications with groin sites.   Instructions reviewed with patient:  Remove large bandage at puncture site after 24 hours. It is normal to have bruising, tenderness, mild swelling, and a pea or marble sized lump/knot at the groin site which can take up to three months to resolve.  Get help right away if you notice sudden swelling at the puncture site.  Check your puncture site every day for signs of infection: fever, redness, swelling, pus drainage, warmth, foul odor or excessive pain. If this occurs, please call [phone], to speak with the RN Navigator. Get help right away if your puncture site is bleeding and the bleeding does not stop after applying firm pressure to the area.  You may continue to have skipped beats/ atrial fibrillation during the first several months after your procedure.  It is very important not to miss any doses of your blood thinner Eliquis .    You will follow up with the Afib clinic on 05/18/24 and follow up with the APP on 07/23/24.   Patient verbalized understanding to all instructions provided.

## 2024-04-25 ENCOUNTER — Other Ambulatory Visit (HOSPITAL_COMMUNITY): Payer: Self-pay | Admitting: Physician Assistant

## 2024-04-26 NOTE — Anesthesia Postprocedure Evaluation (Signed)
 Anesthesia Post Note  Patient: [name]  Procedure(s) Performed: ATRIAL FIBRILLATION ABLATION     Patient location during evaluation: Cath Lab Anesthesia Type: General Level of consciousness: awake and patient cooperative Pain management: pain level controlled Vital Signs Assessment: post-procedure vital signs reviewed and stable Respiratory status: spontaneous breathing, nonlabored ventilation and respiratory function stable Cardiovascular status: blood pressure returned to baseline and stable Postop Assessment: no apparent nausea or vomiting Anesthetic complications: no   No notable events documented.                  Neil Brickell

## 2024-05-18 ENCOUNTER — Ambulatory Visit (HOSPITAL_COMMUNITY)
Admission: RE | Admit: 2024-05-18 | Discharge: 2024-05-18 | Disposition: A | Discharge status: Home / Self Care | Source: Ambulatory Visit | Attending: Physician Assistant | Admitting: Physician Assistant

## 2024-05-18 ENCOUNTER — Ambulatory Visit (HOSPITAL_COMMUNITY): Admitting: Physician Assistant

## 2024-05-18 VITALS — BP 96/52 | HR 71 | Ht 64.5 in | Wt 110.2 lb

## 2024-05-18 DIAGNOSIS — Z5181 Encounter for therapeutic drug level monitoring: Secondary | ICD-10-CM

## 2024-05-18 DIAGNOSIS — I4891 Unspecified atrial fibrillation: Secondary | ICD-10-CM | POA: Diagnosis not present

## 2024-05-18 DIAGNOSIS — Z79899 Other long term (current) drug therapy: Secondary | ICD-10-CM

## 2024-05-18 DIAGNOSIS — D6869 Other thrombophilia: Secondary | ICD-10-CM | POA: Diagnosis not present

## 2024-05-18 DIAGNOSIS — I4819 Other persistent atrial fibrillation: Secondary | ICD-10-CM | POA: Diagnosis not present

## 2024-05-18 NOTE — Progress Notes (Signed)
 Primary Care Physician: Elliot Charm, MD Primary Cardiologist: Dr Anner Primary Electrophysiologist: Dr Cindie Referring Physician: Dr Cindie Handing Safina Huard is a 78 y.o. female with a history of chronic systolic CHF, moderate to severe TR, endometrial cancer, multiple sclerosis, atrial fibrillation who presents for follow up in the [facility]. She had been maintained on amiodarone  but had dysregulation of her thyroid  as well as abnormal PFT and amiodarone  was discontinued. Patient is on Eliquis  for stroke prevention. She was seen by Dr Cindie 09/19/21, not felt to be a good ablation candidate given chronic pericardial effusion. Dofetilide  was recommended. Patient is s/p dofetilide  admission 01/2022. She continued to have issues with symptomatic afib and underwent afib ablation on 04/20/24.  Patient returns for follow up for atrial fibrillation and dofetilide  monitoring. She remains in SR today and feels well. She denies any interim symptoms of afib since the ablation. She denies chest pain or groin issues. No bleeding issues on anticoagulation.   Today, she  denies symptoms of palpitations, chest pain, shortness of breath, orthopnea, PND, lower extremity edema, dizziness, presyncope, syncope, snoring, daytime somnolence, bleeding, or neurologic sequela. The patient is tolerating medications without difficulties and is otherwise without complaint today.    Atrial Fibrillation Risk Factors:  she does not have symptoms or diagnosis of sleep apnea. she does not have a history of rheumatic fever.   Atrial Fibrillation Management history:  Previous antiarrhythmic drugs: amiodarone , dofetilide   Previous cardioversions: none Previous ablations: 04/20/24 Anticoagulation history: Eliquis    Past Medical History:  Diagnosis Date   A-fib (HCC)    Dilated cardiomyopathy (HCC) 05/28/2021   NON-ISCHEMIC -> with restoration of sinus rhythm, EF  improved from 25-30% up to 40 and 45%.  Moderate-severe TR noted   Diverticulosis 2012   With intermittent diverticulitis   Endometrial cancer (HCC) 2009   S/p vaginal hysterectomy   Family history of adverse reaction to anesthesia    mother ponv   Glaucoma    Hypothyroidism due to thyroiditis treated with radioactive iodine    Idiopathic pericardial effusion 05/16/2021   Migraine    Mitral valve regurgitation due to prolapse of cusp 05/16/2021   Echo: Moderate MR with holosystolic prolapse of the posterior MV   Multiple sclerosis 2008   Well-controlled, currently not on medications.  Repatha subcu 3 times weekly from 2008-2021.  No further relapse.   Persistent atrial fibrillation (HCC) 05/02/2021    Current Outpatient Medications  Medication Sig Dispense Refill   acetaminophen  (TYLENOL ) 500 MG tablet Take 500-1,000 mg by mouth every 6 (six) hours as needed for moderate pain or headache. (Patient taking differently: Take 500-1,000 mg by mouth as needed for moderate pain (pain score 4-6) or headache.)     apixaban  (ELIQUIS ) 5 MG TABS tablet Take 1 tablet (5 mg total) by mouth 2 (two) times daily. 60 tablet 11   ascorbic acid (VITAMIN C) 500 MG tablet Take 500 mg by mouth 3 (three) times a week.     Cholecalciferol  (VITAMIN D) 50 MCG (2000 UT) tablet Take 2,000 Units by mouth daily.     dofetilide  (TIKOSYN ) 250 MCG capsule TAKE 1 CAPSULE BY MOUTH TWICE  DAILY 180 capsule 1   fluticasone (FLONASE ALLERGY RELIEF) 50 MCG/ACT nasal spray Place 1 spray into both nostrils as needed.     latanoprost  (XALATAN ) 0.005 % ophthalmic solution Place 1 drop into both eyes at bedtime.  6   levothyroxine  (SYNTHROID ) 50 MCG tablet Take 50 mcg by  mouth See admin instructions. Alternating with 75 mcg every other day (Patient taking differently: Take 50 mcg by mouth See admin instructions. Alternating with 75 mcg every other day , 4 times weekly)     levothyroxine  (SYNTHROID ) 75 MCG tablet Take 75 mcg by mouth  See admin instructions. Alternating with 50 mcg (Patient taking differently: Take 75 mcg by mouth See admin instructions. Alternating with 50 mcg  3 times weekly)     metoprolol  succinate (TOPROL -XL) 25 MG 24 hr tablet TAKE 1 TABLET (25 MG) BY MOUTH IN THE MORNING AND AT BEDTIME (Patient taking differently: 12.5 mg 2 (two) times daily.) 180 tablet 3   Misc Natural Products (GLUCOSAMINE CHOND COMPLEX/MSM PO) Take 1 tablet by mouth daily. Replenex     Multiple Minerals-Vitamins (CAL MAG ZINC +D3 PO) Take 1 tablet by mouth at bedtime.     pantoprazole  (PROTONIX ) 40 MG tablet Take 1 tablet (40 mg total) by mouth daily. 45 tablet 0   potassium chloride  SA (KLOR-CON  M) 20 MEQ tablet TAKE 1 TABLET (20 MEQ TOTAL) BY MOUTH 2 (TWO) TIMES DAILY. TAKE ONE TABLET BY MOUTH ONCE DAILY 180 tablet 0   SUMAtriptan  (IMITREX ) 100 MG tablet TAKE 1 TABLET BY MOUTH AS NEEDED FOR HEADACHE MAY REPEAT IN 2 HOURS IF HEADACHE PERSIST 10 tablet 3   No current facility-administered medications for this encounter.    ROS- All systems are reviewed and negative except as per the HPI above.  Physical Exam: Vitals:   05/18/24 1427  Pulse: 71  Weight: 50 kg  Height: 5' 4.5 (1.638 m)    GEN: Well nourished, well developed in no acute distress CARDIAC: Regular rate and rhythm, no murmurs, rubs, gallops RESPIRATORY:  Clear to auscultation without rales, wheezing or rhonchi  ABDOMEN: Soft, non-tender, non-distended EXTREMITIES:  No edema; No deformity    Wt Readings from Last 3 Encounters:  05/18/24 50 kg  04/20/24 49.9 kg  01/30/24 50.7 kg    EKG today demonstrates SR Vent. rate 71 BPM PR interval 152 ms QRS duration 68 ms QT/QTcB 426/462 ms   Echo 07/23/22 demonstrated   1. Left ventricular ejection fraction, by estimation, is 55 to 60%. The  left ventricle has normal function. The left ventricle has no regional  wall motion abnormalities. Left ventricular diastolic parameters are  consistent with Grade II  diastolic dysfunction (pseudonormalization).   2. Right ventricular systolic function is normal. The right ventricular  size is normal. There is normal pulmonary artery systolic pressure.   3. The mitral valve is normal in structure. Mild mitral valve  regurgitation. No evidence of mitral stenosis. There is mild prolapse of  anterior leaflet of the mitral valve.   4. Tricuspid valve regurgitation is mild to moderate.   5. The aortic valve is tricuspid. Aortic valve regurgitation is mild. No  aortic stenosis is present.   6. The inferior vena cava is normal in size with greater than 50%  respiratory variability, suggesting right atrial pressure of 3 mmHg.   Epic records are reviewed at length today  CHA2DS2-VASc Score = 5  The patient's score is based upon: CHF History: 1 HTN History: 0 Diabetes History: 0 Stroke History: 0 Vascular Disease History: 1 Age Score: 2 Gender Score: 1       ASSESSMENT AND PLAN: Persistent Atrial Fibrillation/atypical atrial flutter (ICD10:  I48.19) The patient's CHA2DS2-VASc score is 5, indicating a 7.2% annual risk of stroke.   S/p dofetilide  loading 01/2022 S/p afib ablation 04/20/24 Patient appears to  be maintaining SR Continue dofetilide  250 mcg BID Continue Eliquis  5 mg BID  Continue Toprol  12.5 mg BID  Secondary Hypercoagulable State (ICD10:  D68.69) The patient is at significant risk for stroke/thromboembolism based upon her CHA2DS2-VASc Score of 5.  Continue Apixaban  (Eliquis ). No bleeding issues.   High Risk Medication Monitoring (ICD 10: U5195107) Patient requires ongoing monitoring for anti-arrhythmic medication which has the potential to cause life threatening arrhythmias. QT interval on ECG acceptable for dofetilide  monitoring. Check magnesium  today. Recent bmet reviewed.   HFrecEF EF 55-60% GDMT per primary cardiology team Fluid status appears stable today   Follow up with Charlies Arthur as scheduled.    Daril Kicks PA-C Afib  [facility] [address] [place], [place] [address] [phone]

## 2024-05-19 ENCOUNTER — Ambulatory Visit (HOSPITAL_COMMUNITY): Payer: Self-pay | Admitting: Physician Assistant

## 2024-05-19 LAB — MAGNESIUM: Magnesium: 2.1 mg/dL (ref 1.6–2.3)

## 2024-05-25 ENCOUNTER — Encounter: Payer: Self-pay | Admitting: Cardiology

## 2024-05-26 ENCOUNTER — Other Ambulatory Visit: Payer: Self-pay | Admitting: Cardiology

## 2024-05-31 NOTE — Telephone Encounter (Signed)
 Pt Of Dr. Anner. This RX has 2 different Sig's. Previous O/V states this same Sig. Please advise.

## 2024-06-07 ENCOUNTER — Telehealth: Payer: Self-pay | Admitting: Cardiology

## 2024-06-07 MED ORDER — POTASSIUM CHLORIDE CRYS ER 20 MEQ PO TBCR: 20.0000 meq | EXTENDED_RELEASE_TABLET | Freq: Two times a day (BID) | ORAL | 0 refills | Status: DC

## 2024-06-07 NOTE — Telephone Encounter (Signed)
 Per pt, she has always took Potassium 20 meq bid.  [facility].

## 2024-06-07 NOTE — Telephone Encounter (Signed)
*  STAT* If patient is at the pharmacy, call can be transferred to refill team.   1. Which medications need to be refilled? (please list name of each medication and dose if known)   potassium chloride  SA (KLOR-CON  M) 20 MEQ tablet    4. Which pharmacy/location (including street and city if local pharmacy) is medication to be sent to?  [id number] - [place], [place] - [address]     5. Do they need a 30 day or 90 day supply? 90    Scheduled 12/12

## 2024-06-23 ENCOUNTER — Other Ambulatory Visit: Payer: Self-pay | Admitting: Internal Medicine

## 2024-06-23 DIAGNOSIS — N6320 Unspecified lump in the left breast, unspecified quadrant: Secondary | ICD-10-CM

## 2024-06-23 DIAGNOSIS — N63 Unspecified lump in unspecified breast: Secondary | ICD-10-CM

## 2024-06-29 ENCOUNTER — Ambulatory Visit
Admission: RE | Admit: 2024-06-29 | Discharge: 2024-06-29 | Disposition: A | Source: Ambulatory Visit | Attending: Internal Medicine | Admitting: Internal Medicine

## 2024-06-29 ENCOUNTER — Ambulatory Visit
Admission: RE | Admit: 2024-06-29 | Discharge: 2024-06-29 | Disposition: A | Discharge status: Home / Self Care | Source: Ambulatory Visit | Attending: Internal Medicine | Admitting: Internal Medicine

## 2024-06-29 DIAGNOSIS — N6320 Unspecified lump in the left breast, unspecified quadrant: Secondary | ICD-10-CM

## 2024-07-13 ENCOUNTER — Other Ambulatory Visit (HOSPITAL_COMMUNITY)

## 2024-07-18 NOTE — Progress Notes (Unsigned)
  Electrophysiology Office Follow up Visit Note:    Date:  07/20/2024   ID:  [name], DOB July 09, 1946, MRN [id number]  PCP:  Elliot Charm, MD  [facility] Cardiologist:  Alm Clay, MD  [facility] Electrophysiologist:  OLE ONEIDA HOLTS, MD    Interval History:     [name] is a 78 y.o. female who presents for a follow up visit.   The patient had a catheter ablation for her atrial fibrillation in April 20, 2024 during which the veins, posterior wall were ablated.  The patient is already TEE May 18, 2024 and was doing well maintaining sinus rhythm.  She takes Eliquis  for stroke prophylaxis.  She is doing well today.  No recurrence of her atrial fibrillation.  She monitors her heart rhythm using an Apple watch.      Past medical, surgical, social and family history were reviewed.  ROS:   Please see the history of present illness.    All other systems reviewed and are negative.  EKGs/Labs/Other Studies Reviewed:    The following studies were reviewed today:     EKG Interpretation Date/Time:  Tuesday July 20 2024 11:42:24 EST Ventricular Rate:  74 PR Interval:  160 QRS Duration:  70 QT Interval:  420 QTC Calculation: 466 R Axis:   29  Text Interpretation: Normal sinus rhythm Normal ECG Confirmed by Holts Ole [phone]) on 07/20/2024 11:56:35 AM    Physical Exam:    VS:  BP 106/68   Pulse 74   Ht 5' 4.5 (1.638 m)   Wt 109 lb 12.8 oz (49.8 kg)   SpO2 97%   BMI 18.56 kg/m     Wt Readings from Last 3 Encounters:  07/20/24 109 lb 12.8 oz (49.8 kg)  05/18/24 110 lb 3.2 oz (50 kg)  04/20/24 110 lb (49.9 kg)     GEN: no distress CARD: RRR, No MRG RESP: No IWOB. CTAB.      ASSESSMENT:    1. Encounter for monitoring dofetilide  therapy   2. Persistent atrial fibrillation (HCC)    PLAN:    In order of problems listed above:  #Persistent atrial fibrillation #High risk medication  monitoring-dofetilide   doing well after catheter ablation QTc acceptable for ongoing dofetilide  use Continue Eliquis  and metoprolol   Repeat BMP and magnesium  today  I discussed my upcoming departure from [facility] during today's clinic appointment.  The patient will continue to follow-up with one of my EP partners moving forward.  Follow-up in 4 months with EP APP or Dr. Kennyth   Signed, Ole Holts, MD, [facility] 07/20/2024 12:00 PM    Electrophysiology [facility]

## 2024-07-20 ENCOUNTER — Encounter: Payer: Self-pay | Admitting: Cardiology

## 2024-07-20 ENCOUNTER — Ambulatory Visit: Attending: Cardiology | Admitting: Cardiology

## 2024-07-20 VITALS — BP 106/68 | HR 74 | Ht 64.5 in | Wt 109.8 lb

## 2024-07-20 DIAGNOSIS — I4819 Other persistent atrial fibrillation: Secondary | ICD-10-CM

## 2024-07-20 DIAGNOSIS — Z5181 Encounter for therapeutic drug level monitoring: Secondary | ICD-10-CM

## 2024-07-20 DIAGNOSIS — Z79899 Other long term (current) drug therapy: Secondary | ICD-10-CM

## 2024-07-20 MED ORDER — METOPROLOL SUCCINATE ER 25 MG PO TB24: 12.5000 mg | ORAL_TABLET | Freq: Every day | ORAL | 3 refills | Status: AC

## 2024-07-20 NOTE — Patient Instructions (Signed)
 Medication Instructions:  Your physician recommends that you continue on your current medications as directed. Please refer to the Current Medication list given to you today.  *If you need a refill on your cardiac medications before your next appointment, please call your pharmacy*  Lab Work: Today: bmet, magnesium   If you have any lab test that is abnormal or we need to change your treatment, we will call you to review the results.  Testing/Procedures: None ordered  Follow-Up: At [facility], you and your health needs are our priority.  As part of our continuing mission to provide you with exceptional heart care, our providers are all part of one team.  This team includes your primary Cardiologist (physician) and Advanced Practice Providers or APPs (Physician Assistants and Nurse Practitioners) who all work together to provide you with the care you need, when you need it.  Your next appointment:   4 month(s)  Provider:   Fonda Kitty, MD    Thank you for choosing [facility]!!   [phone]

## 2024-07-21 ENCOUNTER — Ambulatory Visit: Admitting: Physician Assistant

## 2024-07-21 LAB — BASIC METABOLIC PANEL WITH GFR
BUN/Creatinine Ratio: 25 (ref 12–28)
BUN: 21 mg/dL (ref 8–27)
CO2: 24 mmol/L (ref 20–29)
Calcium: 9.9 mg/dL (ref 8.7–10.3)
Chloride: 102 mmol/L (ref 96–106)
Creatinine, Ser: 0.85 mg/dL (ref 0.57–1.00)
Glucose: 90 mg/dL (ref 70–99)
Potassium: 4.4 mmol/L (ref 3.5–5.2)
Sodium: 139 mmol/L (ref 134–144)
eGFR: 71 mL/min/1.73 (ref >59)

## 2024-07-21 LAB — MAGNESIUM: Magnesium: 2.1 mg/dL (ref 1.6–2.3)

## 2024-07-22 ENCOUNTER — Ambulatory Visit (HOSPITAL_COMMUNITY): Admission: RE | Admit: 2024-07-22 | Discharge: 2024-07-22 | Attending: Cardiology | Admitting: Cardiology

## 2024-07-22 ENCOUNTER — Ambulatory Visit: Payer: Self-pay | Admitting: Cardiology

## 2024-07-22 DIAGNOSIS — I340 Nonrheumatic mitral (valve) insufficiency: Secondary | ICD-10-CM | POA: Insufficient documentation

## 2024-07-22 DIAGNOSIS — I428 Other cardiomyopathies: Secondary | ICD-10-CM | POA: Insufficient documentation

## 2024-07-22 DIAGNOSIS — I341 Nonrheumatic mitral (valve) prolapse: Secondary | ICD-10-CM | POA: Diagnosis present

## 2024-07-22 DIAGNOSIS — D6869 Other thrombophilia: Secondary | ICD-10-CM | POA: Diagnosis present

## 2024-07-22 DIAGNOSIS — I4819 Other persistent atrial fibrillation: Secondary | ICD-10-CM | POA: Insufficient documentation

## 2024-07-22 LAB — ECHOCARDIOGRAM COMPLETE
Area-P 1/2: 3.3 cm2
P 1/2 time: 838 ms
S' Lateral: 2.2 cm

## 2024-07-23 ENCOUNTER — Ambulatory Visit: Admitting: Cardiology

## 2024-07-23 ENCOUNTER — Ambulatory Visit: Admitting: Physician Assistant

## 2024-08-16 DIAGNOSIS — Z1231 Encounter for screening mammogram for malignant neoplasm of breast: Secondary | ICD-10-CM

## 2024-09-03 ENCOUNTER — Other Ambulatory Visit: Payer: Self-pay | Admitting: Cardiology

## 2024-09-03 ENCOUNTER — Ambulatory Visit: Admission: RE | Admit: 2024-09-03 | Source: Ambulatory Visit

## 2024-09-03 DIAGNOSIS — Z1231 Encounter for screening mammogram for malignant neoplasm of breast: Secondary | ICD-10-CM

## 2024-09-06 ENCOUNTER — Ambulatory Visit

## 2024-09-06 NOTE — Progress Notes (Unsigned)
 " Cardiology Office Note:  .   Date:  09/07/2024  ID:  [name], DOB [birth year], MRN [id number] PCP: Elliot Charm, MD  [facility] Providers Cardiologist:  Alm Clay, MD Electrophysiologist:  OLE ONEIDA HOLTS, MD (Inactive)     Chief Complaint  Patient presents with   Follow-up    No complaints today.   Atrial Fibrillation    Doing well post ablation   Congestive Heart Failure    No PND orthopnea.  No dyspnea.   Cardiac Valve Problem    Follow-up echocardiogram results    Patient Profile: .     [name] is a very anxious 79 y.o. female with a PMH notable for significant symptomatic PAF (now on Tikosyn  and s/p ablation in September 2025)complicated by NICM (Tachycardia induced CM, now resolved), who presents here for 35-month follow-up (first primary cardiology follow-up after atrial fibrillation at the request of Elliot Charm, MD.  [name] is a very pleasant / anxious 79 y.o. female with a PMH noted below, who presents here for annual Primary Cardiologist.  Follow-up at the request of Elliot Charm,*.  Notable PMH: Symptomatic Persistent A-Fib Initially on amiodarone , but TFTs and PFTs necessitated stopping. => Converted to dofetilide /Tikosyn  Started on Tikosyn  01/22/2022 Ablation was initially deferred due to pericardial effusion, but follow-up echocardiogram did not show fusion. Complicated by Nonischemic (Tachycardic) Cardiomyopathy with moderate-severe TR and moderate MR -> now resolved Idiopathic Pericardial Effusion -> now resolved Hypothyroidism  I last saw Mandy on Jan 02, 2024 when she was tachycardic in A-fib in the 120s.  She has no recurrent episodes of fatigue or weakness described as spells that would occur usually at rest.  She would feel almost near syncopal.  Her Apple watch suggested A-fib.  At that time she was on Tikosyn  which had previously been controlling her A-fib.  When not  having her spells she was doing well with no major issues.  No anginal symptoms or CHF symptoms.  I ordered labs and echocardiogram to follow-up her mitral prolapse and EF.  Referred her back to EP to consider ablation,  and scheduled for cardioversion.  When she presented for cardioversion, she is back in sinus rhythm however the next day when she was seen in the A-fib clinic  on June 5.  She saw Dr. Holts on January 30, 2024 => she was scheduled for A-Fib Ablation (PVI and likely Posterior Wall Ablation) 04/20/2024   [name] was last by Dr. Holts for postop evaluation on July 20, 2024 (prior to his moving-).  She remains on Eliquis  for stroke prophylaxis and is still on Tikosyn .  She was doing well maintaining sinus rhythm.  No changes made.  Plan was for 52-month follow-up with Dr. Kennyth or EP APP)    Subjective  Discussed the use of AI scribe software for clinical note transcription with the patient, who gave verbal consent to proceed.  History of Present Illness [name] is a 79 year old female with atrial fibrillation who presents for follow-up after ablation therapy.  She has a history of atrial fibrillation and underwent pulmonary vein isolation and posterior wall isolation ablation on September 9th. Since the procedure, she has experienced no breakthrough spells of atrial fibrillation and is currently in sinus rhythm. Her current medications include Tikosyn  250 mcg, Eliquis , and a low dose of metoprolol  to prevent fast rhythms. No side effects such as dizziness or syncope have been experienced.  An echocardiogram performed in December 2025  showed an ejection fraction of 60-65% and mild mitral regurgitation; when she was in atrial fibrillation previously, her regurgitation was moderate to severe. She reports no symptoms of heart racing, chest pain, or shortness of breath since the ablation.  Her current medications also include Synthroid , with a regimen of 75  mcg on Monday, Wednesday, and Friday, and 50 mcg on other days, due to previous fluctuations in her thyroid  levels. She takes potassium twice daily, calcium, magnesium , zinc, vitamin C, and uses Flonase spray as needed. She has not needed Imitrex  for migraines recently.  No blood in stools, hard stools, epistaxis, swelling, or shortness of breath during activities. She reports being active, although she has not returned to her regular fitness routine due to recent surgeries and weather conditions.  She is planning a trip to [place] .  Cardiovascular ROS: no chest pain or dyspnea on exertion negative for - edema, irregular heartbeat, loss of consciousness, orthopnea, palpitations, paroxysmal nocturnal dyspnea, rapid heart rate, shortness of breath, or dizziness, lightheadedness or near syncope, TIA/amaurosis fugax, claudication.  Melena, hematochezia, hematuria or epistaxis.   Objective   Medications - Tikosyn  250 mcg twice daily - Toprol  XL 25 mg, 1/2 tablet twice a day - Losartan  25 mg daily - Apixaban  5 mg twice daily - Mag-Ox 400 mg daily; zinc 50 mg 3 times a week; biotin 10,000 mcg daily calcium D5 100-3 tabs daily, glucosamine conjoint and daily  Studies Reviewed: SABRA   EKG Interpretation Date/Time:  Tuesday September 07 2024 09:41:10 EST Ventricular Rate:  74 PR Interval:  162 QRS Duration:  70 QT Interval:  416 QTC Calculation: 461 R Axis:   54  Text Interpretation: Sinus rhythm with marked sinus arrhythmia Cannot rule out Anterior infarct , age undetermined When compared with ECG of 20-Jul-2024 11:42, No significant change was found Confirmed by Anner Lenis ([id number]) on 09/07/2024 10:14:48 AM    Results Lab Results  Component Value Date   CHOL 212 (H) 09/07/2021   HDL 93 09/07/2021   LDLCALC 109 (H) 09/07/2021   TRIG 54 09/07/2021   CHOLHDL 2.3 09/07/2021   Lab Results  Component Value Date   NA 139 07/20/2024   K 4.4 07/20/2024   CREATININE 0.85 07/20/2024   EGFR 71  07/20/2024   GLUCOSE 90 07/20/2024   Lab Results  Component Value Date   WBC 4.8 03/30/2024   HGB 13.0 03/30/2024   HCT 40.0 03/30/2024   MCV 99 (H) 03/30/2024   PLT 205 03/30/2024   No results found for: HGBA1C   Diagnostic Echocardiogram (07/22/2024): Normal LVEF 60 to 65% with no RWMA.  Small pericardial effusion.  Anterior MV leaflet prolapse with mild MR.  RAP estimated 8 mmHg.  No significant changes MR.  Small pericardial effusion noted.  Cardiac CT-Morph/Pulmonary Vein (03/30/2024): Normal pulmonary vein drainage into the LAA with suitable ostial measurements.  No LAA thrombus.  Esophagus runs in the LA midline-not in proximity to pulmonary vein ostia.  No PFO or ASD.  CAC score 0 with mild aortic atherosclerosis.  Normal coronary origins.  Procedures: Atrial Fibrillation Ablation (04/20/2024): Comprehensive EP study.  3D mapping.  Successful PVI and Isolation/Ablation of Posterior Wall.  Trivial pericardial effusion. Colchicine  0.6 mg p.o. twice daily for 5 days and Protonix  40 mg p.o. daily for 45 days.  Diagnostic Transthoracic echocardiogram (07/2024): Left ventricular ejection fraction 60-65%, no wall motion abnormality, anterior mitral leaflet prolapse, mild mitral regurgitation, small pericardial effusion Prior CV Studies/Procedures Reviewed:  Cardiac MRI (March 2023): Normal EF  right and left.  No infarction.  Moderate pericardial effusion.   R LHC (November 2022): Angiographically normal coronaries.  Relatively normal right heart cath numbers with a wedge pressure of 20 mmHg and mean RAP of 12 mmHg.   Risk Assessment/Calculations:    CHA2DS2-VASc Score = 5   This indicates a 7.2% annual risk of stroke. The patient's score is based upon: CHF History: 1 HTN History: 0 Diabetes History: 0 Stroke History: 0 Vascular Disease History: 1 Age Score: 2 Gender Score: 1              Physical Exam:   VS:  BP 110/68   Pulse 74   Ht 5' 4.5 (1.638 m)   Wt 108 lb  9.6 oz (49.3 kg)   SpO2 96%   BMI 18.35 kg/m    Wt Readings from Last 3 Encounters:  09/07/24 108 lb 9.6 oz (49.3 kg)  07/20/24 109 lb 12.8 oz (49.8 kg)  05/18/24 110 lb 3.2 oz (50 kg)    GEN: Healthy appearing.  In good spirits.  Well nourished, well groomed. She Is in no acute distress;  NECK: No JVD; No carotid bruits CARDIAC: RRR with ectopy; Normal S1, S2; no obvious murmurs, rubs, gallops RESPIRATORY:  Clear to auscultation without rales, wheezing or rhonchi ; nonlabored, good air movement. ABDOMEN: Soft, non-tender, non-distended EXTREMITIES:  No edema; No deformity      ASSESSMENT AND PLAN: .   Persistent atrial fibrillation (HCC) -> CHA2DS2-VASc score 4 (CHF, aortic plaque, Age 81, female) Status post pulmonary vein and posterior wall isolation. Currently in sinus rhythm with sinus arrhythmia. Tikosyn  used to maintain sinus rhythm. Eliquis  continued for anticoagulation. No Tikosyn  side effects. Goal to maintain sinus rhythm for scar tissue formation and AFib prevention. - Continue Tikosyn  250 mg daily. - Continue Eliquis  as prescribed. - Continue metoprolol  at a low dose. - Schedule follow-up with electrophysiology in April or May. - Recheck echocardiogram in December.  Hypercoagulable state due to persistent atrial fibrillation : CHA2DS2-VASc score 4 Defer to EP for timing as to when or not she can stop Eliquis  post ablation.  Tolerating Eliquis  well.    Okay to hold Eliquis  2 days for procedures or surgeries.  Idiopathic pericardial effusion Small effusion noted on recent echo.  Can follow-up in the future with echocardiogram in December.  Mitral valve regurgitation due to prolapse of cusp Mitral valve prolapse with mild regurgitation, improved due to sinus rhythm. No wall motion abnormalities. Monitoring for potential worsening. - Monitor mitral valve function with echocardiogram in December. - Maintain sinus rhythm to prevent worsening of mitral  regurgitation.  Nonischemic cardiomyopathy (HCC) Nonischemic cardiomyopathy with restoration of ejection fraction (EF 60-65%).  Improved ejection fraction post-ablation. No wall motion abnormalities. Well-managed with current treatment. - Continue current medication regimen. - Recheck echocardiogram in December.  On dofetilide  therapy Close follow-up with EP Stressed importance of healthy diet to maintain electrolyte normalcy.   Orders Placed This Encounter  Procedures   EKG 12-Lead   ECHOCARDIOGRAM COMPLETE    Meds ordered this encounter  Medications   apixaban  (ELIQUIS ) 5 MG TABS tablet    Sig: Take 1 tablet (5 mg total) by mouth 2 (two) times daily.    Dispense:  90 tablet    Refill:  3    Please place on file . Patient will call you when she needs it   dofetilide  (TIKOSYN ) 250 MCG capsule    Sig: Take 1 capsule (250 mcg total) by mouth 2 (two)  times daily.    Dispense:  180 capsule    Refill:  3    Please send a replace/new response with 90-Day Supply if appropriate to maximize member benefit. Requesting 1 year supply.        Follow-Up: Return in about 1 year (around 09/07/2025) for 1 Yr Follow-up, Routine follow up with me.  Portions of this note were dictated using DRAGON voice recognition software. Please disregard any errors in transcription. This record has been created using Conservation officer, historic buildings. Errors have been sought and corrected, but may not always be located. Such creation errors do not reflect on the standard of medical care.      Signed, Alm MICAEL Clay, MD, MS Alm Clay, M.D., M.S. Interventional Cardiologist  [facility] Pager # [phone]      "

## 2024-09-07 ENCOUNTER — Encounter: Payer: Self-pay | Admitting: Cardiology

## 2024-09-07 ENCOUNTER — Ambulatory Visit: Attending: Cardiology | Admitting: Cardiology

## 2024-09-07 VITALS — BP 110/68 | HR 74 | Ht 64.5 in | Wt 108.6 lb

## 2024-09-07 DIAGNOSIS — I428 Other cardiomyopathies: Secondary | ICD-10-CM | POA: Diagnosis not present

## 2024-09-07 DIAGNOSIS — I3139 Other pericardial effusion (noninflammatory): Secondary | ICD-10-CM | POA: Diagnosis not present

## 2024-09-07 DIAGNOSIS — D6869 Other thrombophilia: Secondary | ICD-10-CM

## 2024-09-07 DIAGNOSIS — Z79899 Other long term (current) drug therapy: Secondary | ICD-10-CM | POA: Diagnosis not present

## 2024-09-07 DIAGNOSIS — I341 Nonrheumatic mitral (valve) prolapse: Secondary | ICD-10-CM | POA: Diagnosis not present

## 2024-09-07 DIAGNOSIS — I340 Nonrheumatic mitral (valve) insufficiency: Secondary | ICD-10-CM

## 2024-09-07 DIAGNOSIS — I4819 Other persistent atrial fibrillation: Secondary | ICD-10-CM | POA: Diagnosis not present

## 2024-09-07 MED ORDER — APIXABAN 5 MG PO TABS: 5.0000 mg | ORAL_TABLET | Freq: Two times a day (BID) | ORAL | 3 refills | Status: AC

## 2024-09-07 MED ORDER — DOFETILIDE 250 MCG PO CAPS: 250.0000 ug | ORAL_CAPSULE | Freq: Two times a day (BID) | ORAL | 3 refills | Status: AC

## 2024-09-07 NOTE — Patient Instructions (Addendum)
 Medication Instructions:   No changes   *If you need a refill on your cardiac medications before your next appointment, please call your pharmacy*   Lab Work: Not needed     Testing/Procedures:  Schedule in Dec 2026-- Your physician has requested that you have an echocardiogram. Echocardiography is a painless test that uses sound waves to create images of your heart. It provides your doctor with information about the size and shape of your heart and how well your hearts chambers and valves are working. This procedure takes approximately one hour. There are no restrictions for this procedure. Please do NOT wear cologne, perfume, aftershave, or lotions (deodorant is allowed). Please arrive 15 minutes prior to your appointment time.  Please note: We ask at that you not bring children with you during ultrasound (echo/ vascular) testing. Due to room size and safety concerns, children are not allowed in the ultrasound rooms during exams. Our front office staff cannot provide observation of children in our lobby area while testing is being conducted. An adult accompanying a patient to their appointment will only be allowed in the ultrasound room at the discretion of the ultrasound technician under special circumstances. We apologize for any inconvenience.\   Follow-Up: At [facility], you and your health needs are our priority.  As part of our continuing mission to provide you with exceptional heart care, we have created designated Provider Care Teams.  These Care Teams include your primary Cardiologist (physician) and Advanced Practice Providers (APPs -  Physician Assistants and Nurse Practitioners) who all work together to provide you with the care you need, when you need it.     Your next appointment:   12 month(s) ( after Echo is completed  The format for your next appointment:   In Person  Provider:   Alm Clay, MD   Other Instructions

## 2024-09-07 NOTE — Assessment & Plan Note (Signed)
 Status post pulmonary vein and posterior wall isolation. Currently in sinus rhythm with sinus arrhythmia. Tikosyn  used to maintain sinus rhythm. Eliquis  continued for anticoagulation. No Tikosyn  side effects. Goal to maintain sinus rhythm for scar tissue formation and AFib prevention. - Continue Tikosyn  250 mg daily. - Continue Eliquis  as prescribed. - Continue metoprolol  at a low dose. - Schedule follow-up with electrophysiology in April or May. - Recheck echocardiogram in December.

## 2024-09-07 NOTE — Assessment & Plan Note (Signed)
 Defer to EP for timing as to when or not she can stop Eliquis  post ablation.  Tolerating Eliquis  well.    Okay to hold Eliquis  2 days for procedures or surgeries.

## 2024-09-07 NOTE — Assessment & Plan Note (Signed)
 Mitral valve prolapse with mild regurgitation, improved due to sinus rhythm. No wall motion abnormalities. Monitoring for potential worsening. - Monitor mitral valve function with echocardiogram in December. - Maintain sinus rhythm to prevent worsening of mitral regurgitation.

## 2024-09-07 NOTE — Assessment & Plan Note (Addendum)
 Close follow-up with EP Stressed importance of healthy diet to maintain electrolyte normalcy.

## 2024-09-07 NOTE — Assessment & Plan Note (Signed)
 Small effusion noted on recent echo.  Can follow-up in the future with echocardiogram in December.

## 2024-09-07 NOTE — Assessment & Plan Note (Signed)
 Nonischemic cardiomyopathy with restoration of ejection fraction (EF 60-65%).  Improved ejection fraction post-ablation. No wall motion abnormalities. Well-managed with current treatment. - Continue current medication regimen. - Recheck echocardiogram in December.

## 2025-01-05 ENCOUNTER — Ambulatory Visit: Admitting: Cardiology

## 2025-01-11 ENCOUNTER — Ambulatory Visit: Admitting: Adult Health

## 2025-01-27 ENCOUNTER — Ambulatory Visit: Admitting: Adult Health

## 2025-07-25 ENCOUNTER — Ambulatory Visit (HOSPITAL_COMMUNITY)
